# Patient Record
Sex: Male | Born: 1960 | Race: Black or African American | Hispanic: No | Smoking: Never smoker
Health system: Southern US, Community
[De-identification: ages and names within clinical notes are randomized; demographics above are authoritative.]

## PROBLEM LIST (undated history)

## (undated) DIAGNOSIS — I639 Cerebral infarction, unspecified: Secondary | ICD-10-CM

## (undated) DIAGNOSIS — G8220 Paraplegia, unspecified: Secondary | ICD-10-CM

## (undated) DIAGNOSIS — R569 Unspecified convulsions: Secondary | ICD-10-CM

## (undated) DIAGNOSIS — K219 Gastro-esophageal reflux disease without esophagitis: Secondary | ICD-10-CM

## (undated) DIAGNOSIS — R7303 Prediabetes: Secondary | ICD-10-CM

## (undated) DIAGNOSIS — N186 End stage renal disease: Secondary | ICD-10-CM

## (undated) DIAGNOSIS — I10 Essential (primary) hypertension: Secondary | ICD-10-CM

## (undated) DIAGNOSIS — N189 Chronic kidney disease, unspecified: Secondary | ICD-10-CM

## (undated) DIAGNOSIS — K5900 Constipation, unspecified: Secondary | ICD-10-CM

## (undated) DIAGNOSIS — E119 Type 2 diabetes mellitus without complications: Secondary | ICD-10-CM

## (undated) HISTORY — PX: CSF SHUNT: SHX92

---

## 2003-04-20 ENCOUNTER — Inpatient Hospital Stay (HOSPITAL_COMMUNITY): Admission: EM | Admit: 2003-04-20 | Discharge: 2003-04-24 | Payer: Self-pay | Admitting: Emergency Medicine

## 2004-12-07 ENCOUNTER — Emergency Department (HOSPITAL_COMMUNITY): Admission: EM | Admit: 2004-12-07 | Discharge: 2004-12-07 | Payer: Self-pay | Admitting: Emergency Medicine

## 2004-12-25 ENCOUNTER — Emergency Department: Payer: Self-pay | Admitting: Emergency Medicine

## 2013-09-14 ENCOUNTER — Other Ambulatory Visit (HOSPITAL_COMMUNITY): Payer: Self-pay | Admitting: Nephrology

## 2013-09-14 DIAGNOSIS — N289 Disorder of kidney and ureter, unspecified: Secondary | ICD-10-CM

## 2013-09-29 ENCOUNTER — Ambulatory Visit (HOSPITAL_COMMUNITY): Payer: Medicaid Other | Attending: Nephrology

## 2013-10-15 ENCOUNTER — Ambulatory Visit (HOSPITAL_COMMUNITY)
Admission: RE | Admit: 2013-10-15 | Discharge: 2013-10-15 | Disposition: A | Discharge status: Home / Self Care | Payer: Medicaid Other | Source: Ambulatory Visit | Attending: Nephrology | Admitting: Nephrology

## 2013-10-15 DIAGNOSIS — N289 Disorder of kidney and ureter, unspecified: Secondary | ICD-10-CM

## 2013-10-15 DIAGNOSIS — N19 Unspecified kidney failure: Secondary | ICD-10-CM | POA: Insufficient documentation

## 2013-10-15 DIAGNOSIS — N281 Cyst of kidney, acquired: Secondary | ICD-10-CM | POA: Insufficient documentation

## 2014-09-16 ENCOUNTER — Telehealth: Payer: Self-pay

## 2014-09-16 NOTE — Telephone Encounter (Signed)
Gastroenterology Pre-Procedure Review  Request Date: 09/16/2014 Requesting Physician: Dr. Legrand Rams  PATIENT REVIEW QUESTIONS: The patient responded to the following health history questions as indicated:    Pt's nephew, Cobey Caballeros, called to schedule pt's colonoscopy/ he gave me all of the triage info Terrell's mom, Iancarlo Neuburger has taken care of the pt for about a year She does not have POA papers, but said she has been signing everything for him She said she would be the one to bring the pt to the hospital and sign him out and drive him home  PLEASE ADVISE IF I CAN SCHEDULE   1. Diabetes Melitis: no 2. Joint replacements in the past 12 months: no 3. Major health problems in the past 3 months: no 4. Has an artificial valve or MVP: no 5. Has a defibrillator: no 6. Has been advised in past to take antibiotics in advance of a procedure like teeth cleaning: no    MEDICATIONS & ALLERGIES:    Patient reports the following regarding taking any blood thinners:   Plavix? no Aspirin? no Coumadin? no  Patient confirms/reports the following medications:  Current Outpatient Prescriptions  Medication Sig Dispense Refill  . amLODipine (NORVASC) 5 MG tablet Take 5 mg by mouth daily.    Marland Kitchen atorvastatin (LIPITOR) 10 MG tablet Take 10 mg by mouth daily.    . enalapril (VASOTEC) 10 MG tablet Take 10 mg by mouth daily.    Marland Kitchen FLUoxetine (PROZAC) 10 MG capsule Take 10 mg by mouth daily.    . furosemide (LASIX) 40 MG tablet Take 40 mg by mouth.    . hydrALAZINE (APRESOLINE) 25 MG tablet Take 25 mg by mouth 3 (three) times daily.    . metoprolol (LOPRESSOR) 50 MG tablet Take 50 mg by mouth 2 (two) times daily.    . NON FORMULARY Vitamin D3  1000 IU   One daily    . NON FORMULARY travatan eye gtts as directed    . omeprazole (PRILOSEC) 20 MG capsule Take 20 mg by mouth daily.    . polyethylene glycol (MIRALAX / GLYCOLAX) packet Take 17 g by mouth daily. Pt takes once weekly     No current  facility-administered medications for this visit.    Patient confirms/reports the following allergies:  No Known Allergies  No orders of the defined types were placed in this encounter.    AUTHORIZATION INFORMATION Primary Insurance:   ID #:  Group #:  Pre-Cert / Auth required: Pre-Cert / Auth #:   Secondary Insurance:   ID #:   Group #:  Pre-Cert / Auth required:  Pre-Cert / Auth #:   SCHEDULE INFORMATION: Procedure has been scheduled as follows:  Date:                      Time:  Location:   This Gastroenterology Pre-Precedure Review Form is being routed to the following provider(s): R. Garfield Cornea, MD

## 2014-09-19 NOTE — Telephone Encounter (Signed)
Ok to schedule.

## 2014-09-21 ENCOUNTER — Other Ambulatory Visit: Payer: Self-pay

## 2014-09-21 DIAGNOSIS — Z1211 Encounter for screening for malignant neoplasm of colon: Secondary | ICD-10-CM

## 2014-09-21 NOTE — Telephone Encounter (Signed)
Spoke to Cantrall. He is aware that we are cancelling the OV appt on 09/30/2014 since pt was triaged for TCS. LM for Marcell Hocevar to call me to schedule the colonoscopy.

## 2014-09-22 MED ORDER — PEG-KCL-NACL-NASULF-NA ASC-C 100 G PO SOLR: 1.0000 | ORAL | Status: DC

## 2014-09-22 NOTE — Telephone Encounter (Signed)
LMOM again for Bryce Miller to call.

## 2014-09-22 NOTE — Telephone Encounter (Signed)
PT is scheduled for 09/30/2014 at 2:30 PM with Dr. Gala Romney. Sending [facility] to [facility] and faxing the instructions to [facility]. Hoyle Sauer is aware instructions will be faxed to [facility].

## 2014-09-22 NOTE — Telephone Encounter (Signed)
LMOM for Hoyle Sauer to call in reference to her signing for the pt's procedure. Need to find out how many siblings, etc.

## 2014-09-23 NOTE — Telephone Encounter (Signed)
LMOM for Bryce Miller that it is very important for her to call today in reference to this pt's colonoscopy.

## 2014-09-24 NOTE — Telephone Encounter (Signed)
I spoke to Ruma. She and her son Enid Derry have been taking care of the pt since 06/2014. The other siblings are not involved in his care.  I called Melanie at Endo and she said that since Hoyle Sauer is next of kin that should be fine.  I informed Hoyle Sauer and she will be with him at the hospital.

## 2014-09-30 ENCOUNTER — Ambulatory Visit (HOSPITAL_COMMUNITY)
Admission: RE | Admit: 2014-09-30 | Discharge: 2014-09-30 | Disposition: A | Discharge status: Home / Self Care | Payer: Medicaid Other | Source: Ambulatory Visit | Attending: Internal Medicine | Admitting: Internal Medicine

## 2014-09-30 ENCOUNTER — Ambulatory Visit: Payer: Medicaid Other | Admitting: Nurse Practitioner

## 2014-09-30 ENCOUNTER — Encounter (HOSPITAL_COMMUNITY)
Admission: RE | Disposition: A | Discharge status: Home / Self Care | Payer: Self-pay | Source: Ambulatory Visit | Attending: Internal Medicine

## 2014-09-30 ENCOUNTER — Encounter (HOSPITAL_COMMUNITY): Payer: Self-pay | Admitting: *Deleted

## 2014-09-30 DIAGNOSIS — Z1211 Encounter for screening for malignant neoplasm of colon: Secondary | ICD-10-CM | POA: Diagnosis not present

## 2014-09-30 DIAGNOSIS — Z800 Family history of malignant neoplasm of digestive organs: Secondary | ICD-10-CM | POA: Insufficient documentation

## 2014-09-30 DIAGNOSIS — I10 Essential (primary) hypertension: Secondary | ICD-10-CM | POA: Diagnosis not present

## 2014-09-30 DIAGNOSIS — Z8673 Personal history of transient ischemic attack (TIA), and cerebral infarction without residual deficits: Secondary | ICD-10-CM | POA: Diagnosis not present

## 2014-09-30 DIAGNOSIS — Z79899 Other long term (current) drug therapy: Secondary | ICD-10-CM | POA: Diagnosis not present

## 2014-09-30 HISTORY — DX: Essential (primary) hypertension: I10

## 2014-09-30 HISTORY — DX: Cerebral infarction, unspecified: I63.9

## 2014-09-30 HISTORY — PX: COLONOSCOPY: SHX5424

## 2014-09-30 SURGERY — COLONOSCOPY
Anesthesia: Moderate Sedation

## 2014-09-30 MED ORDER — SIMETHICONE 40 MG/0.6ML PO SUSP
ORAL | Status: DC | PRN
  Administered 2014-09-30: 14:00:00

## 2014-09-30 MED ORDER — MIDAZOLAM HCL 5 MG/5ML IJ SOLN
INTRAMUSCULAR | Status: AC
  Filled 2014-09-30: qty 10

## 2014-09-30 MED ORDER — SODIUM CHLORIDE 0.9 % IV SOLN
INTRAVENOUS | Status: DC
  Administered 2014-09-30: 13:00:00 via INTRAVENOUS

## 2014-09-30 MED ORDER — ONDANSETRON HCL 4 MG/2ML IJ SOLN
INTRAMUSCULAR | Status: AC
  Filled 2014-09-30: qty 2

## 2014-09-30 MED ORDER — MIDAZOLAM HCL 5 MG/5ML IJ SOLN
INTRAMUSCULAR | Status: DC | PRN
Start: 2014-09-30 — End: 2014-09-30
  Administered 2014-09-30: 1 mg via INTRAVENOUS
  Administered 2014-09-30: 2 mg via INTRAVENOUS
  Administered 2014-09-30: 1 mg via INTRAVENOUS

## 2014-09-30 MED ORDER — ONDANSETRON HCL 4 MG/2ML IJ SOLN
INTRAMUSCULAR | Status: DC | PRN
Start: 2014-09-30 — End: 2014-09-30
  Administered 2014-09-30: 4 mg via INTRAVENOUS

## 2014-09-30 MED ORDER — MEPERIDINE HCL 100 MG/ML IJ SOLN
INTRAMUSCULAR | Status: DC | PRN
  Administered 2014-09-30 (×3): 25 mg via INTRAVENOUS

## 2014-09-30 MED ORDER — MEPERIDINE HCL 100 MG/ML IJ SOLN
INTRAMUSCULAR | Status: AC
  Filled 2014-09-30: qty 2

## 2014-09-30 NOTE — H&P (Signed)
_0 @   Primary Care Physician:  Rosita Fire, MD Primary Gastroenterologist:  Dr. Gala Romney  Pre-Procedure History & Physical: HPI:  Bryce Miller is a 54 y.o. male is here for a screening colonoscopy. No prior colonoscopy. No bowel complaints. CVA last year. Discussed at length with the nephew and sister, caregivers. No family history of colon cancer.  Past Medical History  Diagnosis Date  . Stroke   . Hypertension     No past surgical history on file.  Prior to Admission medications   Medication Sig Start Date End Date Taking? Authorizing Provider  amLODipine (NORVASC) 5 MG tablet Take 5 mg by mouth daily.   Yes Historical Provider, MD  atorvastatin (LIPITOR) 10 MG tablet Take 10 mg by mouth daily.   Yes Historical Provider, MD  cholecalciferol (VITAMIN D) 1000 UNITS tablet Take 1,000 Units by mouth daily.   Yes Historical Provider, MD  enalapril (VASOTEC) 10 MG tablet Take 10 mg by mouth daily.   Yes Historical Provider, MD  FLUoxetine (PROZAC) 10 MG capsule Take 10 mg by mouth daily.   Yes Historical Provider, MD  furosemide (LASIX) 40 MG tablet Take 40 mg by mouth.   Yes Historical Provider, MD  hydrALAZINE (APRESOLINE) 25 MG tablet Take 25 mg by mouth 3 (three) times daily.   Yes Historical Provider, MD  metoprolol (LOPRESSOR) 50 MG tablet Take 50 mg by mouth 2 (two) times daily.   Yes Historical Provider, MD  omeprazole (PRILOSEC) 20 MG capsule Take 20 mg by mouth daily.   Yes Historical Provider, MD  peg 3350 powder (MOVIPREP) 100 G SOLR Take 1 kit (200 g total) by mouth as directed. 09/22/14  Yes Daneil Dolin, MD  polyethylene glycol [facility] / Floria Raveling) packet Take 17 g by mouth daily. Pt takes once weekly   Yes Historical Provider, MD  travoprost, benzalkonium, (TRAVATAN) 0.004 % ophthalmic solution Place 1 drop into both eyes 3 (three) times daily.   Yes Historical Provider, MD    Allergies as of 09/21/2014  . (No Known Allergies)    No family history on  file.  History   Social History  . Marital Status: Divorced    Spouse Name: N/A  . Number of Children: N/A  . Years of Education: N/A   Occupational History  . Not on file.   Social History Main Topics  . Smoking status: Never Smoker   . Smokeless tobacco: Never Used  . Alcohol Use: No  . Drug Use: Not on file  . Sexual Activity: Not Currently   Other Topics Concern  . Not on file   Social History Narrative  . No narrative on file    Review of Systems: See HPI, otherwise negative ROS  Physical Exam: BP 168/113 mmHg  Pulse 65  Temp(Src) 98.2 F (36.8 C) (Oral)  Resp 14  Ht _1  (1.778 m)  Wt 230 lb (104.327 kg)  BMI 33.00 kg/m2  SpO2 96% General:   Alert,  Very little verbalization. pleasant and cooperative in NAD Head:  Normocephalic and atraumatic. Eyes:  Sclera clear, no icterus.   Conjunctiva pink. Ears:  Normal auditory acuity. Nose:  No deformity, discharge,  or lesions. Mouth:  No deformity or lesions, dentition normal. Neck:  Supple; no masses or thyromegaly. Lungs:  Clear throughout to auscultation.   No wheezes, crackles, or rhonchi. No acute distress. Heart:  Regular rate and rhythm; no murmurs, clicks, rubs,  or gallops. Abdomen:  Soft, nontender and nondistended. No masses, hepatosplenomegaly or hernias  noted. Normal bowel sounds, without guarding, and without rebound.   Msk:  Symmetrical without gross deformities. Normal posture. Pulses:  Normal pulses noted. Extremities:  Without clubbing or edema.  Impression/Plan:   Bryce Miller is now here to undergo a screening colonoscopy.  First ever screening colonoscopy-average risk. Risks, benefits, limitations, imponderables and alternatives regarding colonoscopy have been reviewed with the patient. Questions have been answered. All parties agreeable.     Notice:  This dictation was prepared with Dragon dictation along with smaller phrase technology. Any transcriptional errors that result from  this process are unintentional and may not be corrected upon review.

## 2014-09-30 NOTE — Op Note (Signed)
[facility] [address] [place], [address]   COLONOSCOPY PROCEDURE REPORT  PATIENT: Bryce Miller, Bryce Miller  MR#: [id number] BIRTHDATE: Sep 20, 1960 , 54  yrs. old GENDER: male ENDOSCOPIST: R.  Garfield Cornea, MD FACP [facility] REFERRED [id number] Legrand Rams, M.D. PROCEDURE DATE:  October 14, 2014 PROCEDURE:   Colonoscopy, screening INDICATIONS:First-ever average risk colorectal cancer screening examination. MEDICATIONS: Versed 4 mg IV and Demerol 75 mg IV in divided doses. Zofran 4 mg IV. ASA CLASS:       Class III  CONSENT: The risks, benefits, alternatives and imponderables including but not limited to bleeding, perforation as well as the possibility of a missed lesion have been reviewed.  The potential for biopsy, lesion removal, etc. have also been discussed. Questions have been answered.  All parties agreeable.  Please see the history and physical in the medical record for more information.  DESCRIPTION OF PROCEDURE:   After the risks benefits and alternatives of the procedure were thoroughly explained, informed consent was obtained.  The digital rectal exam revealed no abnormalities of the rectum.   The EC-[id number]  endoscope was introduced through the anus and advanced to the   . No adverse events experienced.   The quality of the prep was adequate  The instrument was then slowly withdrawn as the colon was fully examined.      COLON FINDINGS: Normal-appearing rectal mucosa.  Diffusely dilated redundant colon.  However, the colonic mucosa appeared entirely normal.  Retroflexion was performed. .  Withdrawal time=13 minutes 0 seconds.  The scope was withdrawn and the procedure completed. COMPLICATIONS: There were no immediate complications.  ENDOSCOPIC IMPRESSION: Normal colonoscopy (redundant colon)  RECOMMENDATIONS: Repeat colonoscopy in 10 years for screening purposes. Blood pressure noted to be up today. I recommend he resume his blood pressure medication  today.  eSigned:  R. Garfield Cornea, MD Rosalita Chessman [facility] 10/14/2014 2:55 PM   cc:  CPT CODES: ICD CODES:  The ICD and CPT codes recommended by this software are interpretations from the data that the clinical staff has captured with the software.  The verification of the translation of this report to the ICD and CPT codes and modifiers is the sole responsibility of the health care institution and practicing physician where this report was generated.  [place]. will not be held responsible for the validity of the ICD and CPT codes included on this report.  AMA assumes no liability for data contained or not contained herein. CPT is a Designer, television/film set of the [employer].

## 2014-09-30 NOTE — Discharge Instructions (Addendum)
°  Colonoscopy Discharge Instructions  Read the instructions outlined below and refer to this sheet in the next few weeks. These discharge instructions provide you with general information on caring for yourself after you leave the hospital. Your doctor may also give you specific instructions. While your treatment has been planned according to the most current medical practices available, unavoidable complications occasionally occur. If you have any problems or questions after discharge, call Dr. Gala Romney at [phone]. ACTIVITY  You may resume your regular activity, but move at a slower pace for the next 24 hours.   Take frequent rest periods for the next 24 hours.   Walking will help get rid of the air and reduce the bloated feeling in your belly (abdomen).   No driving for 24 hours (because of the medicine (anesthesia) used during the test).    Do not sign any important legal documents or operate any machinery for 24 hours (because of the anesthesia used during the test).  NUTRITION  Drink plenty of fluids.   You may resume your normal diet as instructed by your doctor.   Begin with a light meal and progress to your normal diet. Heavy or fried foods are harder to digest and may make you feel sick to your stomach (nauseated).   Avoid alcoholic beverages for 24 hours or as instructed.  MEDICATIONS  You may resume your normal medications unless your doctor tells you otherwise.  WHAT YOU CAN EXPECT TODAY  Some feelings of bloating in the abdomen.   Passage of more gas than usual.   Spotting of blood in your stool or on the toilet paper.  IF YOU HAD POLYPS REMOVED DURING THE COLONOSCOPY:  No aspirin products for 7 days or as instructed.   No alcohol for 7 days or as instructed.   Eat a soft diet for the next 24 hours.  FINDING OUT THE RESULTS OF YOUR TEST Not all test results are available during your visit. If your test results are not back during the visit, make an appointment  with your caregiver to find out the results. Do not assume everything is normal if you have not heard from your caregiver or the medical facility. It is important for you to follow up on all of your test results.  SEEK IMMEDIATE MEDICAL ATTENTION IF:  You have more than a spotting of blood in your stool.   Your belly is swollen (abdominal distention).   You are nauseated or vomiting.   You have a temperature over 101.   You have abdominal pain or discomfort that is severe or gets worse throughout the day.   Repeat colonoscopy in 10 years for screening purposes  Your blood pressure was noted to be elevated today; resume your blood pressure medications when you get home

## 2014-10-01 ENCOUNTER — Encounter (HOSPITAL_COMMUNITY): Payer: Self-pay | Admitting: Internal Medicine

## 2015-10-21 ENCOUNTER — Other Ambulatory Visit: Payer: Self-pay | Admitting: *Deleted

## 2015-10-21 DIAGNOSIS — Z01810 Encounter for preprocedural cardiovascular examination: Secondary | ICD-10-CM

## 2015-10-21 DIAGNOSIS — N184 Chronic kidney disease, stage 4 (severe): Secondary | ICD-10-CM

## 2015-11-16 ENCOUNTER — Encounter: Payer: Self-pay | Admitting: Vascular Surgery

## 2015-11-24 ENCOUNTER — Other Ambulatory Visit: Payer: Self-pay

## 2015-11-24 ENCOUNTER — Ambulatory Visit (HOSPITAL_COMMUNITY)
Admission: RE | Admit: 2015-11-24 | Discharge: 2015-11-24 | Disposition: A | Discharge status: Home / Self Care | Payer: Medicaid Other | Source: Ambulatory Visit | Attending: Vascular Surgery | Admitting: Vascular Surgery

## 2015-11-24 ENCOUNTER — Ambulatory Visit (INDEPENDENT_AMBULATORY_CARE_PROVIDER_SITE_OTHER)
Admission: RE | Admit: 2015-11-24 | Discharge: 2015-11-24 | Disposition: A | Discharge status: Home / Self Care | Payer: Medicaid Other | Source: Ambulatory Visit | Attending: Vascular Surgery | Admitting: Vascular Surgery

## 2015-11-24 ENCOUNTER — Encounter: Payer: Self-pay | Admitting: Vascular Surgery

## 2015-11-24 ENCOUNTER — Ambulatory Visit (INDEPENDENT_AMBULATORY_CARE_PROVIDER_SITE_OTHER): Payer: Medicaid Other | Admitting: Vascular Surgery

## 2015-11-24 VITALS — BP 151/100 | HR 78 | Temp 98.3°F | Resp 14 | Ht 72.0 in | Wt 225.0 lb

## 2015-11-24 DIAGNOSIS — N184 Chronic kidney disease, stage 4 (severe): Secondary | ICD-10-CM

## 2015-11-24 DIAGNOSIS — I82712 Chronic embolism and thrombosis of superficial veins of left upper extremity: Secondary | ICD-10-CM | POA: Diagnosis not present

## 2015-11-24 DIAGNOSIS — I129 Hypertensive chronic kidney disease with stage 1 through stage 4 chronic kidney disease, or unspecified chronic kidney disease: Secondary | ICD-10-CM | POA: Diagnosis not present

## 2015-11-24 DIAGNOSIS — Z01810 Encounter for preprocedural cardiovascular examination: Secondary | ICD-10-CM | POA: Insufficient documentation

## 2015-11-24 NOTE — Progress Notes (Signed)
Referring Physician: Dr Theador Hawthorne Patient name: Bryce Miller MRN: [id number] DOB: 09/12/1960 Sex: male  REASON FOR CONSULT: Hemodialysis access  HPI: Bryce Miller is a 55 y.o. male referred for placement of long-term hemodialysis access. The patient is paraplegic. 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 He is not really talk much. His family was here with him for the office visit today. He is currently not on hemodialysis. He is right handed. He has not had any prior access procedures. Other medical problems include hypertension which is currently controlled.  Past Medical History  Diagnosis Date  . Stroke ([place])   . Hypertension    Past Surgical History  Procedure Laterality Date  . Colonoscopy N/A 09/30/2014    Procedure: COLONOSCOPY;  Surgeon: Daneil Dolin, MD;  Location: [facility];  Service: Endoscopy;  Laterality: N/A;  2:30 AM - moved to 1:10 - Ginger to notify pt  . Csf shunt      Family History  Problem Relation Age of Onset  . Diabetes Mother     SOCIAL HISTORY: Social History   Social History  . Marital Status: Divorced    Spouse Name: N/A  . Number of Children: N/A  . Years of Education: N/A   Occupational History  . Not on file.   Social History Main Topics  . Smoking status: Never Smoker   . Smokeless tobacco: Never Used  . Alcohol Use: No  . Drug Use: Not on file  . Sexual Activity: Not Currently   Other Topics Concern  . Not on file   Social History Narrative    No Known Allergies  Current Outpatient Prescriptions  Medication Sig Dispense Refill  . amLODipine (NORVASC) 5 MG tablet Take 5 mg by mouth daily.    Marland Kitchen atorvastatin (LIPITOR) 10 MG tablet Take 10 mg by mouth daily.    . cholecalciferol (VITAMIN D) 1000 UNITS tablet Take 1,000 Units by mouth daily.    . enalapril (VASOTEC) 10 MG tablet Take 10 mg by mouth daily.    Marland Kitchen FLUoxetine (PROZAC) 10 MG capsule Take 10 mg by mouth  daily.    . furosemide (LASIX) 40 MG tablet Take 40 mg by mouth daily.     . hydrALAZINE (APRESOLINE) 25 MG tablet Take 25 mg by mouth 3 (three) times daily.    . metoprolol (LOPRESSOR) 50 MG tablet Take 50 mg by mouth 2 (two) times daily.    Marland Kitchen omeprazole (PRILOSEC) 20 MG capsule Take 20 mg by mouth daily.    . polyethylene glycol (MIRALAX / GLYCOLAX) packet Take 17 g by mouth every Thursday.     . travoprost, benzalkonium, (TRAVATAN) 0.004 % ophthalmic solution Place 1 drop into both eyes 3 (three) times daily.     No current facility-administered medications for this visit.    ROS:   Unable to obtain patient does not speak  Physical Examination  Filed Vitals:   11/24/15 0930 11/24/15 0935  BP: 156/105 151/100  Pulse: 66 78  Temp: 98.3 F (36.8 C)   TempSrc: Oral   Resp: 14   Height: 6' (1.829 m)   Weight: 225 lb (102.059 kg)   SpO2: 98%     Body mass index is 30.51 kg/(m^2).  General:  Alert and oriented, no acute distress HEENT: Normal Neck: No bruit or JVD Pulmonary: Clear to auscultation bilaterally Cardiac: Regular Rate and Rhythm without murmur Abdomen: Soft, non-tender, non-distended, no mass Skin: No  rash Extremity Pulses:  2+ radial, brachial pulses bilaterally Musculoskeletal: No deformity or edema  Neurologic: Right upper extremity 5 over 5 motor strength, left upper extremity spastic involuntary movements which he is unable to control  DATA:  Patient had a vein mapping ultrasound today which shows that the right cephalic vein is 3 mm throughout its course the basilic vein is 3-4 mm on the left side of the cephalic vein is 3 mm in diameter in the basilic vein is 3 mm in diameter there was some thrombus at the left antecubital area. Patient had normal arterial anatomic configuration bilaterally. I reviewed and interpreted both of these studies.  ASSESSMENT:  Patient needs long-term hemodialysis access. Although he is right hand dominant, because of the  involuntary movements of his left upper extremity that do not believe he would be able to hold this arm still during hemodialysis. Therefore we'll place a right upper arm brachiocephalic fistula. Risks benefits possible complications were discussed with the patient and his family today. He understands and wishes to proceed. Attention complications including non-maturation of the fistula 10-15%, bleeding infection ischemic steal. They understand and wish to proceed   PLAN:  Right arm AV fistula in the next few weeks.   Ruta Hinds, MD [facility] Office: [phone] Pager: [phone]

## 2015-11-25 NOTE — Progress Notes (Signed)
Pre-op instructions according to checklist left on pt voice mailbox as requested.

## 2015-11-28 ENCOUNTER — Ambulatory Visit (HOSPITAL_COMMUNITY): Admission: RE | Admit: 2015-11-28 | Payer: Medicaid Other | Source: Ambulatory Visit | Admitting: Vascular Surgery

## 2015-11-28 ENCOUNTER — Encounter (HOSPITAL_COMMUNITY): Admission: RE | Payer: Self-pay | Source: Ambulatory Visit

## 2015-11-28 ENCOUNTER — Encounter: Payer: Self-pay | Admitting: Nephrology

## 2015-11-28 SURGERY — ARTERIOVENOUS (AV) FISTULA CREATION
Anesthesia: Monitor Anesthesia Care | Laterality: Right

## 2015-12-01 ENCOUNTER — Other Ambulatory Visit: Payer: Self-pay

## 2015-12-05 ENCOUNTER — Encounter (HOSPITAL_COMMUNITY): Payer: Self-pay | Admitting: *Deleted

## 2015-12-05 NOTE — Progress Notes (Signed)
Spoke with pt's sister, Hoyle Sauer after getting permission from pt to do so. She verified meds, allergies, medical and surgical history. She denies that pt has any cardiac history, she states he has not c/o any recent chest pain or sob. She states pt is a "borderline" diabetic. States they check his blood sugar at home and his fasting blood sugar is usually around 107-114.

## 2015-12-06 ENCOUNTER — Telehealth: Payer: Self-pay | Admitting: Vascular Surgery

## 2015-12-06 ENCOUNTER — Ambulatory Visit (HOSPITAL_COMMUNITY): Payer: Medicaid Other | Admitting: Certified Registered Nurse Anesthetist

## 2015-12-06 ENCOUNTER — Ambulatory Visit (HOSPITAL_COMMUNITY)
Admission: RE | Admit: 2015-12-06 | Discharge: 2015-12-06 | Disposition: A | Discharge status: Home / Self Care | Payer: Medicaid Other | Source: Ambulatory Visit | Attending: Vascular Surgery | Admitting: Vascular Surgery

## 2015-12-06 ENCOUNTER — Encounter (HOSPITAL_COMMUNITY): Payer: Self-pay | Admitting: Certified Registered Nurse Anesthetist

## 2015-12-06 ENCOUNTER — Encounter (HOSPITAL_COMMUNITY)
Admission: RE | Disposition: A | Discharge status: Home / Self Care | Payer: Self-pay | Source: Ambulatory Visit | Attending: Vascular Surgery

## 2015-12-06 DIAGNOSIS — E1122 Type 2 diabetes mellitus with diabetic chronic kidney disease: Secondary | ICD-10-CM | POA: Diagnosis not present

## 2015-12-06 DIAGNOSIS — Z8673 Personal history of transient ischemic attack (TIA), and cerebral infarction without residual deficits: Secondary | ICD-10-CM | POA: Diagnosis not present

## 2015-12-06 DIAGNOSIS — N185 Chronic kidney disease, stage 5: Secondary | ICD-10-CM | POA: Insufficient documentation

## 2015-12-06 DIAGNOSIS — I120 Hypertensive chronic kidney disease with stage 5 chronic kidney disease or end stage renal disease: Secondary | ICD-10-CM | POA: Insufficient documentation

## 2015-12-06 DIAGNOSIS — N184 Chronic kidney disease, stage 4 (severe): Secondary | ICD-10-CM | POA: Diagnosis not present

## 2015-12-06 HISTORY — DX: Gastro-esophageal reflux disease without esophagitis: K21.9

## 2015-12-06 HISTORY — DX: Constipation, unspecified: K59.00

## 2015-12-06 HISTORY — DX: Chronic kidney disease, unspecified: N18.9

## 2015-12-06 HISTORY — DX: Paraplegia, unspecified: G82.20

## 2015-12-06 HISTORY — DX: Type 2 diabetes mellitus without complications: E11.9

## 2015-12-06 HISTORY — PX: AV FISTULA PLACEMENT: SHX1204

## 2015-12-06 LAB — GLUCOSE, CAPILLARY: GLUCOSE-CAPILLARY: 75 mg/dL (ref 65–99)

## 2015-12-06 LAB — POCT I-STAT 4, (NA,K, GLUC, HGB,HCT)
GLUCOSE: 92 mg/dL (ref 65–99)
HEMATOCRIT: 33 % — AB (ref 39.0–52.0)
HEMOGLOBIN: 11.2 g/dL — AB (ref 13.0–17.0)
Potassium: 3.8 mmol/L (ref 3.5–5.1)
Sodium: 143 mmol/L (ref 135–145)

## 2015-12-06 SURGERY — ARTERIOVENOUS (AV) FISTULA CREATION
Anesthesia: Monitor Anesthesia Care | Site: Arm Lower | Laterality: Right

## 2015-12-06 MED ORDER — PHENYLEPHRINE HCL 10 MG/ML IJ SOLN
INTRAMUSCULAR | Status: DC | PRN
  Administered 2015-12-06 (×2): 80 ug via INTRAVENOUS

## 2015-12-06 MED ORDER — FENTANYL CITRATE (PF) 250 MCG/5ML IJ SOLN
INTRAMUSCULAR | Status: AC
  Filled 2015-12-06: qty 5

## 2015-12-06 MED ORDER — PROPOFOL 500 MG/50ML IV EMUL
INTRAVENOUS | Status: DC | PRN
  Administered 2015-12-06: 50 ug/kg/min via INTRAVENOUS

## 2015-12-06 MED ORDER — CHLORHEXIDINE GLUCONATE CLOTH 2 % EX PADS: 6.0000 | MEDICATED_PAD | Freq: Once | CUTANEOUS | Status: DC

## 2015-12-06 MED ORDER — OXYCODONE-ACETAMINOPHEN 5-325 MG PO TABS: 1.0000 | ORAL_TABLET | Freq: Four times a day (QID) | ORAL | Status: DC | PRN

## 2015-12-06 MED ORDER — LIDOCAINE HCL (PF) 1 % IJ SOLN
INTRAMUSCULAR | Status: AC
  Filled 2015-12-06: qty 30

## 2015-12-06 MED ORDER — 0.9 % SODIUM CHLORIDE (POUR BTL) OPTIME
TOPICAL | Status: DC | PRN
  Administered 2015-12-06: 1000 mL

## 2015-12-06 MED ORDER — MIDAZOLAM HCL 2 MG/2ML IJ SOLN
INTRAMUSCULAR | Status: DC | PRN
  Administered 2015-12-06: 2 mg via INTRAVENOUS

## 2015-12-06 MED ORDER — LIDOCAINE HCL (CARDIAC) 20 MG/ML IV SOLN
INTRAVENOUS | Status: DC | PRN
  Administered 2015-12-06: 100 mg via INTRAVENOUS

## 2015-12-06 MED ORDER — PROPOFOL 10 MG/ML IV BOLUS
INTRAVENOUS | Status: DC | PRN
  Administered 2015-12-06: 20 mg via INTRAVENOUS

## 2015-12-06 MED ORDER — FENTANYL CITRATE (PF) 250 MCG/5ML IJ SOLN
INTRAMUSCULAR | Status: DC | PRN
  Administered 2015-12-06: 50 ug via INTRAVENOUS

## 2015-12-06 MED ORDER — MIDAZOLAM HCL 2 MG/2ML IJ SOLN
INTRAMUSCULAR | Status: AC
  Filled 2015-12-06: qty 2

## 2015-12-06 MED ORDER — DEXTROSE 5 % IV SOLN
1.5000 g | INTRAVENOUS | Status: AC
  Administered 2015-12-06: 1.5 g via INTRAVENOUS

## 2015-12-06 MED ORDER — SODIUM CHLORIDE 0.9 % IV SOLN
INTRAVENOUS | Status: DC | PRN
  Administered 2015-12-06: 09:00:00

## 2015-12-06 MED ORDER — DEXTROSE 5 % IV SOLN
INTRAVENOUS | Status: AC
  Filled 2015-12-06: qty 1.5

## 2015-12-06 MED ORDER — LIDOCAINE HCL (PF) 1 % IJ SOLN
INTRAMUSCULAR | Status: DC | PRN
  Administered 2015-12-06: 5.5 mL

## 2015-12-06 MED ORDER — SODIUM CHLORIDE 0.9 % IV SOLN
INTRAVENOUS | Status: DC
  Administered 2015-12-06 (×2): via INTRAVENOUS

## 2015-12-06 SURGICAL SUPPLY — 36 items
ADH SKN CLS APL DERMABOND .7 (GAUZE/BANDAGES/DRESSINGS) ×1
ARMBAND PINK RESTRICT EXTREMIT (MISCELLANEOUS) ×2 IMPLANT
CANISTER SUCTION 2500CC (MISCELLANEOUS) ×2 IMPLANT
CANNULA VESSEL 3MM 2 BLNT TIP (CANNULA) ×2 IMPLANT
CLIP TI MEDIUM 6 (CLIP) ×2 IMPLANT
CLIP TI WIDE RED SMALL 6 (CLIP) ×2 IMPLANT
COVER PROBE W GEL 5X96 (DRAPES) IMPLANT
DECANTER SPIKE VIAL GLASS SM (MISCELLANEOUS) ×2 IMPLANT
DERMABOND ADVANCED (GAUZE/BANDAGES/DRESSINGS) ×1
DERMABOND ADVANCED .7 DNX12 (GAUZE/BANDAGES/DRESSINGS) IMPLANT
DRAIN PENROSE 1/4X12 LTX STRL (WOUND CARE) ×2 IMPLANT
ELECT REM PT RETURN 9FT ADLT (ELECTROSURGICAL) ×2
ELECTRODE REM PT RTRN 9FT ADLT (ELECTROSURGICAL) ×1 IMPLANT
GLOVE BIO SURGEON STRL SZ7.5 (GLOVE) ×2 IMPLANT
GLOVE BIOGEL PI IND STRL 6.5 (GLOVE) IMPLANT
GLOVE BIOGEL PI IND STRL 7.0 (GLOVE) IMPLANT
GLOVE BIOGEL PI INDICATOR 6.5 (GLOVE) ×6
GLOVE BIOGEL PI INDICATOR 7.0 (GLOVE) ×1
GLOVE SS BIOGEL STRL SZ 7 (GLOVE) IMPLANT
GLOVE SUPERSENSE BIOGEL SZ 7 (GLOVE) ×1
GOWN STRL REUS W/ TWL LRG LVL3 (GOWN DISPOSABLE) ×3 IMPLANT
GOWN STRL REUS W/TWL LRG LVL3 (GOWN DISPOSABLE) ×6
KIT BASIN OR (CUSTOM PROCEDURE TRAY) ×2 IMPLANT
KIT ROOM TURNOVER OR (KITS) ×2 IMPLANT
LIQUID BAND (GAUZE/BANDAGES/DRESSINGS) ×2 IMPLANT
LOOP VESSEL MINI RED (MISCELLANEOUS) IMPLANT
NS IRRIG 1000ML POUR BTL (IV SOLUTION) ×2 IMPLANT
PACK CV ACCESS (CUSTOM PROCEDURE TRAY) ×2 IMPLANT
PAD ARMBOARD 7.5X6 YLW CONV (MISCELLANEOUS) ×4 IMPLANT
SPONGE SURGIFOAM ABS GEL 100 (HEMOSTASIS) IMPLANT
SUT PROLENE 7 0 BV 1 (SUTURE) ×2 IMPLANT
SUT VIC AB 3-0 SH 27 (SUTURE) ×2
SUT VIC AB 3-0 SH 27X BRD (SUTURE) ×1 IMPLANT
SUT VICRYL 4-0 PS2 18IN ABS (SUTURE) ×2 IMPLANT
UNDERPAD 30X30 INCONTINENT (UNDERPADS AND DIAPERS) ×2 IMPLANT
WATER STERILE IRR 1000ML POUR (IV SOLUTION) ×2 IMPLANT

## 2015-12-06 NOTE — Interval H&P Note (Signed)
[facility]  History and Physical Update  The patient was interviewed and re-examined.  The patient's previous History and Physical has been reviewed and is unchanged from Dr. Nona Dell consult.  There is no change in the plan of care: R BC AVF.  I discussed with the patient and family having myself complete Mr. Yankey's procedure.  The family is ok with this change in surgeons.   Risk, benefits, and alternatives to access surgery were discussed.    The patient is aware the risks include but are not limited to: bleeding, infection, steal syndrome, nerve damage, ischemic monomelic neuropathy, failure to mature, need for additional procedures, death and stroke.    The patient agrees to proceed forward with the procedure.   Adele Barthel, MD [facility] Office: [phone] Pager: [phone]  12/06/2015, 7:38 AM

## 2015-12-06 NOTE — Anesthesia Preprocedure Evaluation (Signed)
Anesthesia Evaluation  Patient identified by MRN, date of birth, ID band Patient awake    Reviewed: Allergy & Precautions, H&P , NPO status , Patient's Chart, lab work & pertinent test results, reviewed documented beta blocker date and time   Airway Mallampati: II  TM Distance: >3 FB Neck ROM: Full    Dental no notable dental hx. (+) Teeth Intact, Dental Advisory Given   Pulmonary neg pulmonary ROS,    Pulmonary exam normal breath sounds clear to auscultation       Cardiovascular hypertension, Pt. on medications and Pt. on home beta blockers  Rhythm:Regular Rate:Normal     Neuro/Psych CVA, Residual Symptoms negative psych ROS   GI/Hepatic Neg liver ROS, GERD  Medicated and Controlled,  Endo/Other  diabetes  Renal/GU Renal InsufficiencyRenal disease  negative genitourinary   Musculoskeletal   Abdominal   Peds  Hematology negative hematology ROS (+)   Anesthesia Other Findings   Reproductive/Obstetrics negative OB ROS                             Anesthesia Physical Anesthesia Plan  ASA: III  Anesthesia Plan: MAC   Post-op Pain Management:    Induction: Intravenous  Airway Management Planned: Simple Face Mask  Additional Equipment:   Intra-op Plan:   Post-operative Plan:   Informed Consent: I have reviewed the patients History and Physical, chart, labs and discussed the procedure including the risks, benefits and alternatives for the proposed anesthesia with the patient or authorized representative who has indicated his/her understanding and acceptance.   Dental advisory given  Plan Discussed with: CRNA  Anesthesia Plan Comments:         Anesthesia Quick Evaluation

## 2015-12-06 NOTE — Progress Notes (Signed)
CBG on arrival to PACU was 75. Patient able to eat and drink adequately. Dr. Therisa Doyne aware.

## 2015-12-06 NOTE — Discharge Instructions (Signed)
° ° °  12/06/2015 Farron Yeargin [id number] 1961-08-06  Surgeon(s): Conrad LaSalle, MD  Procedure(s): ARTERIOVENOUS (AV) FISTULA CREATION-RIGHT upper arm   x Do not stick fistula for 12 weeks

## 2015-12-06 NOTE — Telephone Encounter (Signed)
-----   Message from Mena Goes, RN sent at 12/06/2015 10:29 AM EDT ----- Regarding: schedule   ----- Message -----    From: Gabriel Earing, PA-C    Sent: 12/06/2015   9:42 AM      To: [facility] Charge Pool  S/p right BC AVF 12/06/15.  F/u with Dr. Bridgett Larsson in 6 weeks.  He does not need a duplex.  Thanks [employer]

## 2015-12-06 NOTE — Transfer of Care (Signed)
Immediate Anesthesia Transfer of Care Note  Patient: Bryce Miller  Procedure(s) Performed: Procedure(s): ARTERIOVENOUS (AV) FISTULA CREATION-RIGHT upper arm  (Right)  Patient Location: PACU  Anesthesia Type:MAC  Level of Consciousness: awake and alert   Airway & Oxygen Therapy: Patient Spontanous Breathing and Patient connected to face mask oxygen  Post-op Assessment: Report given to RN and Post -op Vital signs reviewed and stable  Post vital signs: Reviewed and stable  Last Vitals:  Filed Vitals:   12/06/15 0735 12/06/15 0955  BP: 158/110 132/95  Pulse: 66 73  Temp: 37.2 C 36.5 C  Resp: 16 13    Last Pain: There were no vitals filed for this visit.    Patients Stated Pain Goal: 2 ([id number])  Complications: No apparent anesthesia complications

## 2015-12-06 NOTE — H&P (View-Only) (Signed)
Referring Physician: Dr Theador Hawthorne Patient name: Bryce Miller MRN: [id number] DOB: 08-02-1961 Sex: male  REASON FOR CONSULT: Hemodialysis access  HPI: [name] is a 55 y.o. male referred for placement of long-term hemodialysis access. The patient is paraplegic. 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 He is not really talk much. His family was here with him for the office visit today. He is currently not on hemodialysis. He is right handed. He has not had any prior access procedures. Other medical problems include hypertension which is currently controlled.  Past Medical History  Diagnosis Date  . Stroke ([place])   . Hypertension    Past Surgical History  Procedure Laterality Date  . Colonoscopy N/A 09/30/2014    Procedure: COLONOSCOPY;  Surgeon: Daneil Dolin, MD;  [facility];  Service: Endoscopy;  Laterality: N/A;  2:30 AM - moved to 1:10 - Ginger to notify pt  . Csf shunt      Family History  Problem Relation Age of Onset  . Diabetes Mother     SOCIAL HISTORY: Social History   Social History  . Marital Status: Divorced    Spouse Name: N/A  . Number of Children: N/A  . Years of Education: N/A   Occupational History  . Not on file.   Social History Main Topics  . Smoking status: Never Smoker   . Smokeless tobacco: Never Used  . Alcohol Use: No  . Drug Use: Not on file  . Sexual Activity: Not Currently   Other Topics Concern  . Not on file   Social History Narrative    No Known Allergies  Current Outpatient Prescriptions  Medication Sig Dispense Refill  . amLODipine (NORVASC) 5 MG tablet Take 5 mg by mouth daily.    Marland Kitchen atorvastatin (LIPITOR) 10 MG tablet Take 10 mg by mouth daily.    . cholecalciferol (VITAMIN D) 1000 UNITS tablet Take 1,000 Units by mouth daily.    . enalapril (VASOTEC) 10 MG tablet Take 10 mg by mouth daily.    Marland Kitchen FLUoxetine (PROZAC) 10 MG capsule Take 10 mg by mouth  daily.    . furosemide (LASIX) 40 MG tablet Take 40 mg by mouth daily.     . hydrALAZINE (APRESOLINE) 25 MG tablet Take 25 mg by mouth 3 (three) times daily.    . metoprolol (LOPRESSOR) 50 MG tablet Take 50 mg by mouth 2 (two) times daily.    Marland Kitchen omeprazole (PRILOSEC) 20 MG capsule Take 20 mg by mouth daily.    . polyethylene glycol (MIRALAX / GLYCOLAX) packet Take 17 g by mouth every Thursday.     . travoprost, benzalkonium, (TRAVATAN) 0.004 % ophthalmic solution Place 1 drop into both eyes 3 (three) times daily.     No current facility-administered medications for this visit.    ROS:   Unable to obtain patient does not speak  Physical Examination  Filed Vitals:   11/24/15 0930 11/24/15 0935  BP: 156/105 151/100  Pulse: 66 78  Temp: 98.3 F (36.8 C)   TempSrc: Oral   Resp: 14   Height: 6' (1.829 m)   Weight: 225 lb (102.059 kg)   SpO2: 98%     Body mass index is 30.51 kg/(m^2).  General:  Alert and oriented, no acute distress HEENT: Normal Neck: No bruit or JVD Pulmonary: Clear to auscultation bilaterally Cardiac: Regular Rate and Rhythm without murmur Abdomen: Soft, non-tender, non-distended, no mass Skin: No  rash Extremity Pulses:  2+ radial, brachial pulses bilaterally Musculoskeletal: No deformity or edema  Neurologic: Right upper extremity 5 over 5 motor strength, left upper extremity spastic involuntary movements which he is unable to control  DATA:  Patient had a vein mapping ultrasound today which shows that the right cephalic vein is 3 mm throughout its course the basilic vein is 3-4 mm on the left side of the cephalic vein is 3 mm in diameter in the basilic vein is 3 mm in diameter there was some thrombus at the left antecubital area. Patient had normal arterial anatomic configuration bilaterally. I reviewed and interpreted both of these studies.  ASSESSMENT:  Patient needs long-term hemodialysis access. Although he is right hand dominant, because of the  involuntary movements of his left upper extremity that do not believe he would be able to hold this arm still during hemodialysis. Therefore we'll place a right upper arm brachiocephalic fistula. Risks benefits possible complications were discussed with the patient and his family today. He understands and wishes to proceed. Attention complications including non-maturation of the fistula 10-15%, bleeding infection ischemic steal. They understand and wish to proceed   PLAN:  Right arm AV fistula in the next few weeks.   Ruta Hinds, MD [facility] Office: [phone] Pager: [phone]

## 2015-12-06 NOTE — Op Note (Signed)
OPERATIVE NOTE   PROCEDURE: right brachiocephalic arteriovenous fistula placement  PRE-OPERATIVE DIAGNOSIS: chronic kidney disease stage IV-V   POST-OPERATIVE DIAGNOSIS: same as above   SURGEON: Adele Barthel, MD  ASSISTANT(S): Leontine Locket, PAC   ANESTHESIA: general  ESTIMATED BLOOD LOSS: 50 cc  FINDING(S): 1.  Palpable thrill with palpable radial pulse  SPECIMEN(S):  none  INDICATIONS:   Bryce Miller is a 55 y.o. male who presents with chronic kidney disease stage IV-V.  The patient is scheduled for right brachiocephalic arteriovenous fistula placement.  The patient is aware the risks include but are not limited to: bleeding, infection, steal syndrome, nerve damage, ischemic monomelic neuropathy, failure to mature, and need for additional procedures.  The patient is aware of the risks of the procedure and elects to proceed forward.  DESCRIPTION: After full informed written consent was obtained from the patient, the patient was brought back to the operating room and placed supine upon the operating table.  Prior to induction, the patient received IV antibiotics.   After obtaining adequate anesthesia, the patient was then prepped and draped in the standard fashion for a right arm access procedure.  I turned my attention first to identifying the patient's cephalic vein and brachial artery.  Using SonoSite guidance, the location of these vessels were marked out on the skin.   At this point, I injected local anesthetic to obtain a field block of the antecubitum.  In total, I injected about 10 mL of 1% lidocaine without epinephrine.  I made a transverse incision at the level of the antecubitum and dissected through the subcutaneous tissue and fascia to gain exposure of the brachial artery.  This was noted to be 4 mm in diameter externally.  This was dissected out proximally and distally and controlled with vessel loops .  I then dissected out the cephalic vein.  This was noted to be 304  mm in diameter externally.  The distal segment of the vein was ligated with a  2-0 silk, and the vein was transected.  The proximal segment was interrogated with serial dilators.  The vein accepted up to a 5 mm dilator without any difficulty.  I then instilled the heparinized saline into the vein and clamped it.  At this point, I reset my exposure of the brachial artery and placed the artery under tension proximally and distally.  I made an arteriotomy with a #11 blade, and then I extended the arteriotomy with a Potts scissor.  I injected heparinized saline proximal and distal to this arteriotomy.  The vein was then sewn to the artery in an end-to-side configuration with a running stitch of 7-0 Prolene.  Prior to completing this anastomosis, I allowed the vein and artery to backbleed.  There was no evidence of clot from any vessels.  I completed the anastomosis in the usual fashion and then released all vessel loops and clamps.  There was a palpable thrill in the venous outflow, and there was a palpable radial pulse.  At this point, I irrigated out the surgical wound.  There was no further active bleeding.  The subcutaneous tissue was reapproximated with a running stitch of 3-0 Vicryl.  The skin was then reapproximated with a running subcuticular stitch of 4-0 Vicryl.  The skin was then cleaned, dried, and reinforced with Dermabond.  The patient tolerated this procedure well.    COMPLICATIONS: none  CONDITION: stable   Adele Barthel, MD [facility] Office: [phone] Pager: [phone]  12/06/2015,  9:38 AM

## 2015-12-06 NOTE — Telephone Encounter (Signed)
sched appt 01/20/16 at 8:45. Lm on hm# to inform pt of appt.

## 2015-12-06 NOTE — Anesthesia Postprocedure Evaluation (Signed)
Anesthesia Post Note  Patient: Bryce Miller  Procedure(s) Performed: Procedure(s) (LRB): ARTERIOVENOUS (AV) FISTULA CREATION-RIGHT upper arm  (Right)  Patient location during evaluation: PACU Anesthesia Type: MAC Level of consciousness: awake and alert Pain management: pain level controlled Vital Signs Assessment: post-procedure vital signs reviewed and stable Respiratory status: spontaneous breathing, nonlabored ventilation and respiratory function stable Cardiovascular status: stable and blood pressure returned to baseline Anesthetic complications: no    Last Vitals:  Filed Vitals:   12/06/15 1015 12/06/15 1024  BP:  145/99  Pulse: 75 71  Temp:    Resp: 13 12    Last Pain: There were no vitals filed for this visit.               Kiowa Peifer,W. EDMOND

## 2015-12-08 ENCOUNTER — Encounter (HOSPITAL_COMMUNITY): Payer: Self-pay | Admitting: Vascular Surgery

## 2016-01-16 ENCOUNTER — Encounter: Payer: Self-pay | Admitting: Vascular Surgery

## 2016-01-19 DIAGNOSIS — N184 Chronic kidney disease, stage 4 (severe): Secondary | ICD-10-CM | POA: Insufficient documentation

## 2016-01-19 NOTE — Progress Notes (Signed)
    Postoperative Access Visit   History of Present Illness  Bryce Miller is a 55 y.o. year old male who presents for postoperative follow-up for: L BC AVF (Date: 12/06/15).  The patient's wounds are healed.  The patient notes no steal symptoms.  The patient is able to complete their activities of daily living.  The patient's current symptoms are: none.  For VQI Use Only  PRE-ADM LIVING: Home  AMB STATUS: Ambulatory  Physical Examination Filed Vitals:   01/20/16 0850  BP: 140/87  Pulse: 77    LUE: Incision is healed, skin feels warm, hand grip is 5/5, sensation in digits is intact, palpable thrill, bruit can be auscultated, visible fistula, On Sonosite: fistula >6 mm throughout  Medical Decision Making  Bryce Miller is a 55 y.o. year old male who presents s/p L BC AVF .  The patient's access is ready for use.  Thank you for allowing [place] to participate in this patient's care.  Adele Barthel, MD, FACS [facility] Office: [phone] Pager: [phone]

## 2016-01-20 ENCOUNTER — Ambulatory Visit (INDEPENDENT_AMBULATORY_CARE_PROVIDER_SITE_OTHER): Payer: Self-pay | Admitting: Vascular Surgery

## 2016-01-20 ENCOUNTER — Encounter: Payer: Self-pay | Admitting: Vascular Surgery

## 2016-01-20 VITALS — BP 140/87 | HR 77 | Ht 72.0 in | Wt 225.0 lb

## 2016-01-20 DIAGNOSIS — N184 Chronic kidney disease, stage 4 (severe): Secondary | ICD-10-CM

## 2017-02-11 ENCOUNTER — Encounter: Payer: Self-pay | Admitting: Family

## 2017-02-11 ENCOUNTER — Encounter: Payer: Self-pay | Admitting: Vascular Surgery

## 2017-02-11 ENCOUNTER — Ambulatory Visit (INDEPENDENT_AMBULATORY_CARE_PROVIDER_SITE_OTHER): Payer: Medicaid Other | Admitting: Family

## 2017-02-11 ENCOUNTER — Telehealth: Payer: Self-pay | Admitting: *Deleted

## 2017-02-11 VITALS — BP 118/79 | HR 63 | Temp 98.1°F | Resp 16 | Ht 72.0 in | Wt 225.0 lb

## 2017-02-11 DIAGNOSIS — N186 End stage renal disease: Secondary | ICD-10-CM

## 2017-02-11 DIAGNOSIS — Z992 Dependence on renal dialysis: Secondary | ICD-10-CM

## 2017-02-11 DIAGNOSIS — I770 Arteriovenous fistula, acquired: Secondary | ICD-10-CM | POA: Diagnosis not present

## 2017-02-11 NOTE — Telephone Encounter (Signed)
Patients mother called stating the patient is complaining that the AVF has "moved" and is swollen, that it does not "look right'.  Denies fever, drainage and pain.  Scheduled an appointment to see NP today at 12:15.

## 2017-02-11 NOTE — Progress Notes (Signed)
CC: Pt's mother Concern about left arm AV Fistula  History of Present Illness  Bryce Miller is a 56 y.o. (09-08-1960) male who is s/p  L BC AVF (Date: 12/06/15) by Dr. Bridgett Larsson. Dr. Bridgett Larsson last evaluated pt on 01-20-16. At that time the patient's wounds were healed.  The patient noted no steal symptoms.  The patient was able to complete their activities of daily living.  The patient's current symptoms are: none  Dr. Bridgett Larsson advised that the access could be used at that time.  He returns today after phone call received from pt's mother that the AVF does not look right. She states the antecubital portion of the AV fistula was not visible until the last few days and is concerned about this.   Pt has right hemiparesis from a stroke, lives with his mother. Mother states pt walks with a walker sometimes.  He has not yet started hemodialysis, but male family member with him states pt's nephrologist indicated that pt will likely start dialysis between now and Labor Day.  Pt denies steal symptoms in his left upper extremity.  He has not had previous access procedures.    Past Medical History:  Diagnosis Date  . Chronic kidney disease    functioning at 18%  . Constipation   . Diabetes mellitus without complication ([place])    "borderline"  . GERD (gastroesophageal reflux disease)   . Hypertension   . Paraplegic spinal paralysis ([place])   . Stroke [facility])    left side weakness    Social History Social History  Substance Use Topics  . Smoking status: Never Smoker  . Smokeless tobacco: Never Used  . Alcohol use No    Family History Family History  Problem Relation Age of Onset  . Diabetes Mother     Surgical History Past Surgical History:  Procedure Laterality Date  . AV FISTULA PLACEMENT Right 12/06/2015   Procedure: ARTERIOVENOUS (AV) FISTULA CREATION-RIGHT upper arm ;  Surgeon: Conrad , MD;  Location: [facility];  Service: Vascular;  Laterality: Right;  . COLONOSCOPY N/A 09/30/2014   Procedure: COLONOSCOPY;  Surgeon: Daneil Dolin, MD;  [facility];  Service: Endoscopy;  Laterality: N/A;  2:30 AM - moved to 1:10 - Ginger to notify pt  . CSF SHUNT      No Known Allergies  Current Outpatient Prescriptions  Medication Sig Dispense Refill  . amLODipine (NORVASC) 5 MG tablet Take 5 mg by mouth daily.    Marland Kitchen atorvastatin (LIPITOR) 10 MG tablet Take 10 mg by mouth daily.    . cholecalciferol (VITAMIN D) 1000 UNITS tablet Take 1,000 Units by mouth daily.    . enalapril (VASOTEC) 10 MG tablet Take 10 mg by mouth daily.    Marland Kitchen FLUoxetine (PROZAC) 10 MG capsule Take 10 mg by mouth daily.    . furosemide (LASIX) 40 MG tablet Take 40 mg by mouth daily.     . hydrALAZINE (APRESOLINE) 25 MG tablet Take 25 mg by mouth 3 (three) times daily.    . metoprolol (LOPRESSOR) 50 MG tablet Take 50 mg by mouth 2 (two) times daily.    Marland Kitchen omeprazole (PRILOSEC) 20 MG capsule Take 20 mg by mouth daily.    Marland Kitchen oxyCODONE-acetaminophen (PERCOCET) 5-325 MG tablet Take 1 tablet by mouth every 6 (six) hours as needed for severe pain. 8 tablet 0  . polyethylene glycol (MIRALAX / GLYCOLAX) packet Take 17 g by mouth every Thursday.     Marland Kitchen  travoprost, benzalkonium, (TRAVATAN) 0.004 % ophthalmic solution Place 1 drop into both eyes 3 (three) times daily.     No current facility-administered medications for this visit.      REVIEW OF SYSTEMS: see HPI for pertinent positives and negatives    PHYSICAL EXAMINATION:  Vitals:   02/11/17 1220  BP: 118/79  Pulse: 63  Resp: 16  Temp: 98.1 F (36.7 C)  TempSrc: Oral  SpO2: 98%  Weight: 225 lb (102.1 kg)  Height: 6' (1.829 m)   Body mass index is 30.52 kg/m.  General: The patient appears their stated age, seated in a w/c, accompanied by his mother and a male family member.    HEENT:  No gross abnormalities Pulmonary: Respirations are non-labored, BBS are distant, no rales, rhonchi, or wheezes.  Abdomen: Soft and non-tender with normal pitched  bowel sounds.  Musculoskeletal: There are no major deformities other than mild contractures of a couple of fingers of left hand.  Neurologic: M/S: right upper and lower extremities are 5/5, left upper and lower extremities are 3-4/5 Skin: There are no ulcer or rashes noted. Psychiatric: The patient has a flat affect, verbal response in monosyllables, follows commands appropriately.  Cardiovascular: There is a regular rate and rhythm without significant murmur appreciated. Bilateral PT pedal pulses are 2+ palpable. Left upper arm AV fistula with palpable thrill, no aneurysm. Left radial pulse is 2+ palpable.     Medical Decision Making  [name] is a 56 y.o. male who presents with ESRD, left upper arm AV fistula has no aneurysms, no open wounds, no signs of infiection, has a good palpable thrill, no steal symptoms in left upper extremity.   I reassured pt and his mother that the left upper arm AV fistula seems to be in in good working order with no current problems, is ready for access and HD use.   Follow up with [facility] as needed.   Everly Rubalcava, Sharmon Leyden, RN, MSN, FNP-C [facility] [place] Office: [phone]  02/11/2017, 12:23 PM  Clinic MD: Early on call

## 2017-02-12 ENCOUNTER — Ambulatory Visit: Payer: Medicaid Other | Admitting: Family

## 2017-02-26 ENCOUNTER — Encounter: Payer: Self-pay | Admitting: Vascular Surgery

## 2017-03-13 NOTE — Progress Notes (Deleted)
    Postoperative Access Visit   History of Present Illness  Bryce Miller is a 56 y.o. ([birth year]) male who presents for postoperative follow-up for: L BC AVF (Date: 12/06/15).  The patient's wounds are healed.  The patient notes no steal symptoms.  The patient is able to complete their activities of daily living.  The patient's current symptoms are: none.    Physical Examination ***There were no vitals filed for this visit. ***There is no height or weight on file to calculate BMI.  LUE: Incision is healed, skin feels warm, hand grip is 5/5, sensation in digits is intact, palpable thrill, bruit can be auscultated, visible fistula, On Sonosite: fistula >6 mm throughout  Medical Decision Making  Bryce Miller is a 56 y.o. (1960/11/16) male who presents s/p L BC AVF .  The patient's access is ready for use.  Thank you for allowing [place] to participate in this patient's care.   Adele Barthel, MD, FACS [facility] [place] Office: [phone] Pager: [phone]

## 2017-03-15 ENCOUNTER — Ambulatory Visit: Payer: Medicaid Other | Admitting: Vascular Surgery

## 2017-11-19 ENCOUNTER — Other Ambulatory Visit: Payer: Self-pay

## 2017-11-19 ENCOUNTER — Inpatient Hospital Stay (HOSPITAL_COMMUNITY)
Admission: EM | Admit: 2017-11-19 | Discharge: 2017-11-21 | DRG: 871 | Disposition: A | Discharge status: Home / Self Care | Payer: Medicaid Other | Attending: Internal Medicine | Admitting: Internal Medicine

## 2017-11-19 ENCOUNTER — Encounter (HOSPITAL_COMMUNITY): Payer: Self-pay

## 2017-11-19 ENCOUNTER — Emergency Department (HOSPITAL_COMMUNITY): Payer: Medicaid Other

## 2017-11-19 DIAGNOSIS — I517 Cardiomegaly: Secondary | ICD-10-CM | POA: Diagnosis present

## 2017-11-19 DIAGNOSIS — R509 Fever, unspecified: Secondary | ICD-10-CM

## 2017-11-19 DIAGNOSIS — D631 Anemia in chronic kidney disease: Secondary | ICD-10-CM | POA: Diagnosis present

## 2017-11-19 DIAGNOSIS — Z833 Family history of diabetes mellitus: Secondary | ICD-10-CM | POA: Diagnosis not present

## 2017-11-19 DIAGNOSIS — E1122 Type 2 diabetes mellitus with diabetic chronic kidney disease: Secondary | ICD-10-CM | POA: Diagnosis present

## 2017-11-19 DIAGNOSIS — N186 End stage renal disease: Secondary | ICD-10-CM

## 2017-11-19 DIAGNOSIS — I69328 Other speech and language deficits following cerebral infarction: Secondary | ICD-10-CM | POA: Diagnosis not present

## 2017-11-19 DIAGNOSIS — I69364 Other paralytic syndrome following cerebral infarction affecting left non-dominant side: Secondary | ICD-10-CM

## 2017-11-19 DIAGNOSIS — Z992 Dependence on renal dialysis: Secondary | ICD-10-CM | POA: Diagnosis not present

## 2017-11-19 DIAGNOSIS — I120 Hypertensive chronic kidney disease with stage 5 chronic kidney disease or end stage renal disease: Secondary | ICD-10-CM | POA: Diagnosis present

## 2017-11-19 DIAGNOSIS — A411 Sepsis due to other specified staphylococcus: Principal | ICD-10-CM | POA: Diagnosis present

## 2017-11-19 DIAGNOSIS — M898X9 Other specified disorders of bone, unspecified site: Secondary | ICD-10-CM | POA: Diagnosis present

## 2017-11-19 DIAGNOSIS — G8220 Paraplegia, unspecified: Secondary | ICD-10-CM | POA: Diagnosis not present

## 2017-11-19 DIAGNOSIS — R7881 Bacteremia: Secondary | ICD-10-CM

## 2017-11-19 DIAGNOSIS — K219 Gastro-esophageal reflux disease without esophagitis: Secondary | ICD-10-CM | POA: Diagnosis present

## 2017-11-19 DIAGNOSIS — A419 Sepsis, unspecified organism: Secondary | ICD-10-CM | POA: Diagnosis present

## 2017-11-19 LAB — CBC WITH DIFFERENTIAL/PLATELET
Basophils Absolute: 0 10*3/uL (ref 0.0–0.1)
Basophils Relative: 0 %
Eosinophils Absolute: 0 10*3/uL (ref 0.0–0.7)
Eosinophils Relative: 0 %
HEMATOCRIT: 35.3 % — AB (ref 39.0–52.0)
Hemoglobin: 11.1 g/dL — ABNORMAL LOW (ref 13.0–17.0)
LYMPHS ABS: 0.9 10*3/uL (ref 0.7–4.0)
LYMPHS PCT: 17 %
MCH: 29.1 pg (ref 26.0–34.0)
MCHC: 31.4 g/dL (ref 30.0–36.0)
MCV: 92.4 fL (ref 78.0–100.0)
MONO ABS: 0.5 10*3/uL (ref 0.1–1.0)
Monocytes Relative: 10 %
NEUTROS ABS: 3.8 10*3/uL (ref 1.7–7.7)
Neutrophils Relative %: 73 %
Platelets: 227 10*3/uL (ref 150–400)
RBC: 3.82 MIL/uL — ABNORMAL LOW (ref 4.22–5.81)
RDW: 14.2 % (ref 11.5–15.5)
WBC: 5.3 10*3/uL (ref 4.0–10.5)

## 2017-11-19 LAB — COMPREHENSIVE METABOLIC PANEL
ALT: 12 U/L — AB (ref 17–63)
AST: 15 U/L (ref 15–41)
Albumin: 3.6 g/dL (ref 3.5–5.0)
Alkaline Phosphatase: 55 U/L (ref 38–126)
Anion gap: 13 (ref 5–15)
BUN: 20 mg/dL (ref 6–20)
CHLORIDE: 95 mmol/L — AB (ref 101–111)
CO2: 30 mmol/L (ref 22–32)
CREATININE: 4.42 mg/dL — AB (ref 0.61–1.24)
Calcium: 8.4 mg/dL — ABNORMAL LOW (ref 8.9–10.3)
GFR calc Af Amer: 16 mL/min — ABNORMAL LOW (ref >60)
GFR, EST NON AFRICAN AMERICAN: 14 mL/min — AB (ref >60)
Glucose, Bld: 94 mg/dL (ref 65–99)
Potassium: 3.6 mmol/L (ref 3.5–5.1)
SODIUM: 138 mmol/L (ref 135–145)
Total Bilirubin: 0.6 mg/dL (ref 0.3–1.2)
Total Protein: 7.9 g/dL (ref 6.5–8.1)

## 2017-11-19 LAB — URINALYSIS, ROUTINE W REFLEX MICROSCOPIC
Bacteria, UA: NONE SEEN
Bilirubin Urine: NEGATIVE
Glucose, UA: NEGATIVE mg/dL
KETONES UR: NEGATIVE mg/dL
Leukocytes, UA: NEGATIVE
Nitrite: NEGATIVE
PROTEIN: 100 mg/dL — AB
Specific Gravity, Urine: 1.014 (ref 1.005–1.030)
pH: 7 (ref 5.0–8.0)

## 2017-11-19 LAB — I-STAT CG4 LACTIC ACID, ED
LACTIC ACID, VENOUS: 0.82 mmol/L (ref 0.5–1.9)
Lactic Acid, Venous: 2.51 mmol/L (ref 0.5–1.9)

## 2017-11-19 LAB — PROTIME-INR
INR: 1.09
Prothrombin Time: 14 seconds (ref 11.4–15.2)

## 2017-11-19 MED ORDER — ONDANSETRON HCL 4 MG/2ML IJ SOLN: 4.0000 mg | Freq: Four times a day (QID) | INTRAMUSCULAR | Status: DC | PRN

## 2017-11-19 MED ORDER — TRAVOPROST 0.004 % OP SOLN
1.0000 [drp] | Freq: Three times a day (TID) | OPHTHALMIC | Status: DC
  Filled 2017-11-19: qty 2.5

## 2017-11-19 MED ORDER — SODIUM CHLORIDE 0.9% FLUSH: 3.0000 mL | INTRAVENOUS | Status: DC | PRN

## 2017-11-19 MED ORDER — VANCOMYCIN HCL IN DEXTROSE 1-5 GM/200ML-% IV SOLN: 1000.0000 mg | Freq: Once | INTRAVENOUS | Status: DC

## 2017-11-19 MED ORDER — SODIUM CHLORIDE 0.9 % IV SOLN: 250.0000 mL | INTRAVENOUS | Status: DC | PRN

## 2017-11-19 MED ORDER — VANCOMYCIN HCL 10 G IV SOLR
2000.0000 mg | Freq: Once | INTRAVENOUS | Status: AC
  Administered 2017-11-19: 2000 mg via INTRAVENOUS
  Filled 2017-11-19: qty 2000

## 2017-11-19 MED ORDER — VITAMIN D 1000 UNITS PO TABS
1000.0000 [IU] | ORAL_TABLET | Freq: Every day | ORAL | Status: DC
  Administered 2017-11-20 – 2017-11-21 (×2): 1000 [IU] via ORAL
  Filled 2017-11-19 (×2): qty 1

## 2017-11-19 MED ORDER — SODIUM CHLORIDE 0.9 % IV SOLN: 2.0000 g | Freq: Once | INTRAVENOUS | Status: DC

## 2017-11-19 MED ORDER — OXYCODONE-ACETAMINOPHEN 5-325 MG PO TABS
1.0000 | ORAL_TABLET | Freq: Four times a day (QID) | ORAL | Status: DC | PRN
  Filled 2017-11-19: qty 1

## 2017-11-19 MED ORDER — ENALAPRIL MALEATE 5 MG PO TABS
10.0000 mg | ORAL_TABLET | Freq: Every day | ORAL | Status: DC
  Administered 2017-11-19 – 2017-11-20 (×2): 10 mg via ORAL
  Filled 2017-11-19 (×3): qty 2

## 2017-11-19 MED ORDER — PANTOPRAZOLE SODIUM 40 MG PO TBEC
40.0000 mg | DELAYED_RELEASE_TABLET | Freq: Every day | ORAL | Status: DC
  Administered 2017-11-20 – 2017-11-21 (×2): 40 mg via ORAL
  Filled 2017-11-19 (×2): qty 1

## 2017-11-19 MED ORDER — HEPARIN SODIUM (PORCINE) 5000 UNIT/ML IJ SOLN
5000.0000 [IU] | Freq: Three times a day (TID) | INTRAMUSCULAR | Status: DC
  Administered 2017-11-19 – 2017-11-21 (×4): 5000 [IU] via SUBCUTANEOUS
  Filled 2017-11-19 (×5): qty 1

## 2017-11-19 MED ORDER — ACETAMINOPHEN 650 MG RE SUPP: 650.0000 mg | Freq: Four times a day (QID) | RECTAL | Status: DC | PRN

## 2017-11-19 MED ORDER — ACETAMINOPHEN 500 MG PO TABS
1000.0000 mg | ORAL_TABLET | Freq: Once | ORAL | Status: AC
  Administered 2017-11-19: 1000 mg via ORAL
  Filled 2017-11-19: qty 2

## 2017-11-19 MED ORDER — ONDANSETRON HCL 4 MG PO TABS: 4.0000 mg | ORAL_TABLET | Freq: Four times a day (QID) | ORAL | Status: DC | PRN

## 2017-11-19 MED ORDER — SODIUM CHLORIDE 0.9% FLUSH
3.0000 mL | Freq: Two times a day (BID) | INTRAVENOUS | Status: DC
  Administered 2017-11-19 – 2017-11-21 (×3): 3 mL via INTRAVENOUS

## 2017-11-19 MED ORDER — FUROSEMIDE 40 MG PO TABS
40.0000 mg | ORAL_TABLET | Freq: Every day | ORAL | Status: DC
  Administered 2017-11-20 – 2017-11-21 (×2): 40 mg via ORAL
  Filled 2017-11-19 (×3): qty 1

## 2017-11-19 MED ORDER — AMLODIPINE BESYLATE 5 MG PO TABS
5.0000 mg | ORAL_TABLET | Freq: Every day | ORAL | Status: DC
  Administered 2017-11-20: 5 mg via ORAL
  Filled 2017-11-19 (×2): qty 1

## 2017-11-19 MED ORDER — ATORVASTATIN CALCIUM 10 MG PO TABS
10.0000 mg | ORAL_TABLET | Freq: Every day | ORAL | Status: DC
  Administered 2017-11-20 – 2017-11-21 (×2): 10 mg via ORAL
  Filled 2017-11-19 (×3): qty 1

## 2017-11-19 MED ORDER — METOPROLOL TARTRATE 50 MG PO TABS
50.0000 mg | ORAL_TABLET | Freq: Two times a day (BID) | ORAL | Status: DC
  Administered 2017-11-19 – 2017-11-20 (×3): 50 mg via ORAL
  Filled 2017-11-19 (×4): qty 1

## 2017-11-19 MED ORDER — ACETAMINOPHEN 325 MG PO TABS: 650.0000 mg | ORAL_TABLET | Freq: Four times a day (QID) | ORAL | Status: DC | PRN

## 2017-11-19 MED ORDER — POLYETHYLENE GLYCOL 3350 17 G PO PACK
17.0000 g | PACK | ORAL | Status: DC
  Administered 2017-11-21: 17 g via ORAL
  Filled 2017-11-19: qty 1

## 2017-11-19 MED ORDER — SODIUM CHLORIDE 0.9 % IV SOLN
2.0000 g | Freq: Once | INTRAVENOUS | Status: AC
  Administered 2017-11-19: 2 g via INTRAVENOUS
  Filled 2017-11-19: qty 2

## 2017-11-19 MED ORDER — HYDRALAZINE HCL 25 MG PO TABS
25.0000 mg | ORAL_TABLET | Freq: Three times a day (TID) | ORAL | Status: DC
  Administered 2017-11-19 – 2017-11-20 (×3): 25 mg via ORAL
  Filled 2017-11-19 (×4): qty 1

## 2017-11-19 MED ORDER — FLUOXETINE HCL 10 MG PO CAPS
10.0000 mg | ORAL_CAPSULE | Freq: Every day | ORAL | Status: DC
  Administered 2017-11-20 – 2017-11-21 (×2): 10 mg via ORAL
  Filled 2017-11-19 (×3): qty 1

## 2017-11-19 NOTE — ED Triage Notes (Signed)
EMS reports pt was at dialysis and prior to treatment he started having fever and chills.  Pt reports symptoms for the past few days.  Staff told ems pt's temp was 100.1

## 2017-11-19 NOTE — H&P (Signed)
History and Physical    Bryce Miller JSE:[id number] DOB: 1960-12-04 DOA: 11/19/2017  PCP: Rosita Fire, MD   Patient coming from: Dialysis  Chief Complaint: Fever  HPI: Bryce Miller is a 57 y.o. 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  He developed this temperature after completing hemodialysis.  He is a poor historian with limited ability to speak, but appears to deny any symptomatic complaints or concerns.   ED Course: Vital signs in the ED are noted to be stable with temperature 102.2 Fahrenheit.  Laboratory data with no leukocytosis, but lactic acid is 2.51.  Other labs are within normal limits.  Chest x-ray with mild cardiomegaly and vascular congestion but no infiltrate.  Urine has not been collected.  Blood cultures have been collected x2 sets with initiation of vancomycin and cefepime.  Review of Systems: Cannot be adequately obtained due to patient condition.  Past Medical History:  Diagnosis Date  . Chronic kidney disease    functioning at 18%  . Constipation   . Diabetes mellitus without complication ([place])    "borderline"  . GERD (gastroesophageal reflux disease)   . Hypertension   . Paraplegic spinal paralysis ([place])   . Stroke [facility])    left side weakness    Past Surgical History:  Procedure Laterality Date  . AV FISTULA PLACEMENT Right 12/06/2015   Procedure: ARTERIOVENOUS (AV) FISTULA CREATION-RIGHT upper arm ;  Surgeon: Conrad [place], MD;  Location: [facility];  Service: Vascular;  Laterality: Right;  . COLONOSCOPY N/A 09/30/2014   Procedure: COLONOSCOPY;  Surgeon: Daneil Dolin, MD;  [facility];  Service: Endoscopy;  Laterality: N/A;  2:30 AM - moved to 1:10 - Ginger to notify pt  . CSF SHUNT       reports that he has never  smoked. He has never used smokeless tobacco. He reports that he has current or past drug history. Drug: Cocaine. He reports that he does not drink alcohol.  No Known Allergies  Family History  Problem Relation Age of Onset  . Diabetes Mother     Prior to Admission medications   Medication Sig Start Date End Date Taking? Authorizing Provider  amLODipine (NORVASC) 5 MG tablet Take 5 mg by mouth daily.    [provider]  atorvastatin (LIPITOR) 10 MG tablet Take 10 mg by mouth daily.    [provider]  cholecalciferol (VITAMIN D) 1000 UNITS tablet Take 1,000 Units by mouth daily.    [provider]  enalapril (VASOTEC) 10 MG tablet Take 10 mg by mouth daily.    [provider]  FLUoxetine (PROZAC) 10 MG capsule Take 10 mg by mouth daily.    [provider]  furosemide (LASIX) 40 MG tablet Take 40 mg by mouth daily.     [provider]  hydrALAZINE (APRESOLINE) 25 MG tablet Take 25 mg by mouth 3 (three) times daily.    [provider]  metoprolol (LOPRESSOR) 50 MG tablet Take 50 mg by mouth 2 (two) times daily.    [provider]  omeprazole (PRILOSEC) 20 MG capsule Take 20 mg by mouth daily.    [provider]  oxyCODONE-acetaminophen (PERCOCET) 5-325 MG tablet Take 1 tablet by mouth every 6 (six) hours as needed for severe pain. 12/06/15   Gabriel Earing,  PA-C  polyethylene glycol (MIRALAX / GLYCOLAX) packet Take 17 g by mouth every Thursday.     [provider]  travoprost, benzalkonium, (TRAVATAN) 0.004 % ophthalmic solution Place 1 drop into both eyes 3 (three) times daily.    [provider]    Physical Exam: Vitals:   11/19/17 1439 11/19/17 1442 11/19/17 1615  BP:  (!) 126/91 129/83  Pulse:  100 (!) 102  Resp:  20 17  Temp:  (!) 102.2 F (39 C)   TempSrc:  Rectal   SpO2:  100% 95%  Weight: 102.1 kg (225 lb)    Height: 5\' 10"  (1.778 m)      Constitutional: NAD, calm,  comfortable Vitals:   11/19/17 1439 11/19/17 1442 11/19/17 1615  BP:  (!) 126/91 129/83  Pulse:  100 (!) 102  Resp:  20 17  Temp:  (!) 102.2 F (39 C)   TempSrc:  Rectal   SpO2:  100% 95%  Weight: 102.1 kg (225 lb)    Height: 5\' 10"  (1.778 m)     Eyes: lids and conjunctivae normal ENMT: Mucous membranes are moist.  Neck: normal, supple Respiratory: clear to auscultation bilaterally. Normal respiratory effort. No accessory muscle use. On RA. Cardiovascular: Regular rate and rhythm, no murmurs. No extremity edema. Abdomen: no tenderness, no distention. Bowel sounds positive.  Musculoskeletal:  No joint deformity upper and lower extremities.   Skin: no rashes, lesions, ulcers.   Labs on Admission: I have personally reviewed following labs and imaging studies  CBC: Recent Labs  Lab 11/19/17 1526  WBC 5.3  NEUTROABS 3.8  HGB 11.1*  HCT 35.3*  MCV 92.4  PLT 818   Basic Metabolic Panel: Recent Labs  Lab 11/19/17 1526  NA 138  K 3.6  CL 95*  CO2 30  GLUCOSE 94  BUN 20  CREATININE 4.42*  CALCIUM 8.4*   GFR: Estimated Creatinine Clearance: 22.6 mL/min (A) (by C-G formula based on SCr of 4.42 mg/dL (H)). Liver Function Tests: Recent Labs  Lab 11/19/17 1526  AST 15  ALT 12*  ALKPHOS 55  BILITOT 0.6  PROT 7.9  ALBUMIN 3.6   No results for input(s): LIPASE, AMYLASE in the last 168 hours. No results for input(s): AMMONIA in the last 168 hours. Coagulation Profile: Recent Labs  Lab 11/19/17 1526  INR 1.09   Cardiac Enzymes: No results for input(s): CKTOTAL, CKMB, CKMBINDEX, TROPONINI in the last 168 hours. BNP (last 3 results) No results for input(s): PROBNP in the last 8760 hours. HbA1C: No results for input(s): HGBA1C in the last 72 hours. CBG: No results for input(s): GLUCAP in the last 168 hours. Lipid Profile: No results for input(s): CHOL, HDL, LDLCALC, TRIG, CHOLHDL, LDLDIRECT in the last 72 hours. Thyroid Function Tests: No results for input(s):  TSH, T4TOTAL, FREET4, T3FREE, THYROIDAB in the last 72 hours. Anemia Panel: No results for input(s): VITAMINB12, FOLATE, FERRITIN, TIBC, IRON, RETICCTPCT in the last 72 hours. Urine analysis: No results found for: COLORURINE, APPEARANCEUR, LABSPEC, [place], GLUCOSEU, HGBUR, BILIRUBINUR, KETONESUR, PROTEINUR, UROBILINOGEN, NITRITE, LEUKOCYTESUR  Radiological Exams on Admission: Dg Chest Portable 1 View  Result Date: 11/19/2017 CLINICAL DATA:  fever EXAM: PORTABLE CHEST 1 VIEW COMPARISON:  None. FINDINGS: The heart is mildly enlarged. Low lung volumes. Vascular congestion. Lungs are grossly clear. No pneumothorax or pleural effusion. IMPRESSION: Mild cardiomegaly and vascular congestion. Electronically Signed   By: Marybelle Killings M.D.   On: 11/19/2017 15:18    EKG: Independently reviewed. ST at 102 bpm  with L axis deviation.  Assessment/Plan Principal Problem:   Fever Active Problems:   ESRD (end stage renal disease) ([place])   Paraplegic spinal paralysis ([place])    1. Fever with possible sepsis.  Continue with empiric vancomycin and cefepime with blood cultures pending.  No significant pulmonary issues noted and will attempt to collect urine culture since it appears that he might make some urine.  Repeat lactic in a.m. 2. ESRD on HD.  He has completed his hemodialysis today.  Spoke with Dr. Lowanda Foster regarding status of hemodialysis who had updated me on this fact.  Patient apparently developed fever after dialysis session was over.  Nephrology will see in consultation for hemodialysis needs while inpatient. 3. Paraplegic spinal paralysis with speech deficit.  SLP evaluation. Start dysphagia diet for now. 4. History of diabetes.  Not currently on home medication.  Currently well controlled.  Will hold on SSI for now and initiate if he starts to become hyperglycemic.   DVT prophylaxis: Heparin Code Status: Full Family Communication: None at bedside Disposition Plan:Fever evaluation with empiric  IV antibiotics Consults called:Nephrology for HD Admission status: Inpatient, med-surg   Pratik Darleen Crocker DO [facility] Pager [phone]  If 7PM-7AM, please contact night-coverage [email or web address] Password [email or web address]  11/19/2017, 5:11 PM

## 2017-11-19 NOTE — ED Notes (Signed)
Vanc stopped. IV flushed and saline locked.

## 2017-11-19 NOTE — ED Notes (Signed)
CRITICAL VALUE ALERT  Critical Value:  istat lactic 2.51  Date & Time Notied:  11/19/2017 1541  Provider Notified: Dr. Sabra Heck  Orders Received/Actions taken:  See chart

## 2017-11-19 NOTE — ED Notes (Signed)
Pt placed on the cardiac monitor.

## 2017-11-19 NOTE — ED Provider Notes (Signed)
[facility] EMERGENCY DEPARTMENT Provider Note   CSN: [id number] Arrival date & time: 11/19/17  1429     History   Chief Complaint Chief Complaint  Patient presents with  . Fever    HPI Vera Wishart is a 57 y.o. male.  HPI  57 year old male, currently being treated for diabetes, chronic kidney disease on end-stage renal disease with dialysis, has had a paraplegic spinal paralysis and stroke and has chronic left sided weakness.  He presents to the hospital today from the dialysis center.  The patient is a very poor historian and has limited ability to speak, level 5 caveat applies.  Information obtained from the paramedics was that the patient is started on dialysis but became febrile, dialysis was stopped and the patient was sent to the emergency department after the fistula was dressed with a pressure dressing.  There was no bleeding, the patient has no complaints, the family members were here with the patient also states that he has no complaints and has been his normal self.  Past Medical History:  Diagnosis Date  . Chronic kidney disease    functioning at 18%  . Constipation   . Diabetes mellitus without complication ([place])    "borderline"  . GERD (gastroesophageal reflux disease)   . Hypertension   . Paraplegic spinal paralysis ([place])   . Stroke [facility])    left side weakness    Patient Active Problem List   Diagnosis Date Noted  . Chronic kidney disease (CKD), stage IV (severe) ([place]) 01/19/2016  . Colon cancer screening     Past Surgical History:  Procedure Laterality Date  . AV FISTULA PLACEMENT Right 12/06/2015   Procedure: ARTERIOVENOUS (AV) FISTULA CREATION-RIGHT upper arm ;  Surgeon: Conrad [place], MD;  Location: [place];  Service: Vascular;  Laterality: Right;  . COLONOSCOPY N/A 09/30/2014   Procedure: COLONOSCOPY;  Surgeon: Daneil Dolin, MD;  [facility];  Service: Endoscopy;  Laterality: N/A;  2:30 AM - moved to 1:10 - Ginger to notify pt  . CSF  SHUNT          Home Medications    Prior to Admission medications   Medication Sig Start Date End Date Taking? Authorizing Provider  amLODipine (NORVASC) 5 MG tablet Take 5 mg by mouth daily.    [provider]  atorvastatin (LIPITOR) 10 MG tablet Take 10 mg by mouth daily.    [provider]  cholecalciferol (VITAMIN D) 1000 UNITS tablet Take 1,000 Units by mouth daily.    [provider]  enalapril (VASOTEC) 10 MG tablet Take 10 mg by mouth daily.    [provider]  FLUoxetine (PROZAC) 10 MG capsule Take 10 mg by mouth daily.    [provider]  furosemide (LASIX) 40 MG tablet Take 40 mg by mouth daily.     [provider]  hydrALAZINE (APRESOLINE) 25 MG tablet Take 25 mg by mouth 3 (three) times daily.    [provider]  metoprolol (LOPRESSOR) 50 MG tablet Take 50 mg by mouth 2 (two) times daily.    [provider]  omeprazole (PRILOSEC) 20 MG capsule Take 20 mg by mouth daily.    [provider]  oxyCODONE-acetaminophen (PERCOCET) 5-325 MG tablet Take 1 tablet by mouth every 6 (six) hours as needed for severe pain. 12/06/15   Rhyne, Hulen Shouts, PA-C  polyethylene glycol (MIRALAX / GLYCOLAX) packet Take 17 g by mouth every Thursday.     [provider]  travoprost, benzalkonium, (TRAVATAN) 0.004 % ophthalmic solution Place 1 drop into both eyes 3 (three) times daily.    [provider]    Family History Family History  Problem Relation Age of Onset  . Diabetes Mother     Social History Social History   Tobacco Use  . Smoking status: Never Smoker  . Smokeless tobacco: Never Used  Substance Use Topics  . Alcohol use: No  . Drug use: Yes    Types: Cocaine    Comment: none since 2012     Allergies   Patient has no known allergies.   Review of Systems Review of Systems  Unable to perform ROS: Other     Physical Exam Updated Vital Signs BP 129/83   Pulse (!) 102    Temp (!) 102.2 F (39 C) (Rectal)   Resp 17   Ht 5\' 10"  (1.778 m)   Wt 102.1 kg (225 lb)   SpO2 95%   BMI 32.28 kg/m   Physical Exam  Constitutional: He appears well-developed and well-nourished. No distress.  HENT:  Head: Normocephalic and atraumatic.  Mouth/Throat: Oropharynx is clear and moist. No oropharyngeal exudate.  Eyes: Pupils are equal, round, and reactive to light. Conjunctivae and EOM are normal. Right eye exhibits no discharge. Left eye exhibits no discharge. No scleral icterus.  Neck: Normal range of motion. Neck supple. No JVD present. No thyromegaly present.  Cardiovascular: Normal rate, regular rhythm, normal heart sounds and intact distal pulses. Exam reveals no gallop and no friction rub.  No murmur heard. Pulmonary/Chest: Effort normal and breath sounds normal. No respiratory distress. He has no wheezes. He has no rales.  Abdominal: Soft. Bowel sounds are normal. He exhibits no distension and no mass. There is no tenderness.  Musculoskeletal: Normal range of motion. He exhibits no edema, tenderness or deformity.  Flexion contracture of the left arm, weakness of the left lower extremity compared to right side.  Speech is slowed  Lymphadenopathy:    He has no cervical adenopathy.  Neurological: He is alert. Coordination normal.  Skin: Skin is warm and dry. No rash noted. No erythema.  No redness or erythema or induration overlying the fistula, good thrill, no other rashes to the skin, the patient is warm to the touch  Psychiatric: He has a normal mood and affect. His behavior is normal.  Nursing note and vitals reviewed.    ED Treatments / Results  Labs (all labs ordered are listed, but only abnormal results are displayed) Labs Reviewed  COMPREHENSIVE METABOLIC PANEL - Abnormal; Notable for the following components:      Result Value   Chloride 95 (*)    Creatinine, Ser 4.42 (*)    Calcium 8.4 (*)    ALT 12 (*)    GFR calc non Af Amer 14 (*)    GFR calc  Af Amer 16 (*)    All other components within normal limits  CBC WITH DIFFERENTIAL/PLATELET - Abnormal; Notable for the following components:   RBC 3.82 (*)    Hemoglobin 11.1 (*)    HCT 35.3 (*)    All other components within normal limits  I-STAT CG4 LACTIC ACID, ED - Abnormal; Notable for the following components:   Lactic Acid, Venous 2.51 (*)    All other components within normal limits  CULTURE, BLOOD (ROUTINE X 2)  CULTURE, BLOOD (ROUTINE X 2)  PROTIME-INR  URINALYSIS, ROUTINE W REFLEX MICROSCOPIC  I-STAT CG4 LACTIC ACID, ED    EKG None  Radiology Dg Chest Portable 1 View  Result Date: 11/19/2017 CLINICAL DATA:  fever EXAM: PORTABLE CHEST 1 VIEW COMPARISON:  None. FINDINGS: The heart is mildly enlarged. Low lung volumes. Vascular congestion. Lungs are grossly clear. No pneumothorax or pleural effusion. IMPRESSION: Mild cardiomegaly and vascular congestion. Electronically Signed   By: Marybelle Killings M.D.   On: 11/19/2017 15:18    Procedures Procedures (including critical care time)  Medications Ordered in ED Medications  vancomycin (VANCOCIN) 2,000 mg in sodium chloride 0.9 % 500 mL IVPB (2,000 mg Intravenous New Bag/Given 11/19/17 1604)  acetaminophen (TYLENOL) tablet 1,000 mg (has no administration in time range)  ceFEPIme (MAXIPIME) 2 g in sodium chloride 0.9 % 100 mL IVPB (0 g Intravenous Stopped 11/19/17 1620)     Initial Impression / Assessment and Plan / ED Course  I have reviewed the triage vital signs and the nursing notes.  Pertinent labs & imaging results that were available during my care of the patient were reviewed by me and considered in my medical decision making (see chart for details).     Blood cultures were obtained due to the thought that this could be bacteremia.  He does not have a leukocytosis, he is not tachycardic, he is not hypotensive, he is not hypoxic and he has baseline renal dysfunction which requires dialysis.  I do not see any endorgan  injury, I do not see any signs of a source of infection, as he could be bacteremic I have asked the hospitalist to admit him for observation.  At this time he does not fit into a category of sepsis, he does not have a lactic acid over 4, he does not have any complaints at this time  Final Clinical Impressions(s) / ED Diagnoses   Final diagnoses:  Fever, unspecified fever cause  Bacteremia    ED Discharge Orders    None       Noemi Chapel, MD 11/19/17 1655

## 2017-11-19 NOTE — Progress Notes (Signed)
Pharmacy Antibiotic Note  Bryce Miller is a 57 y.o. male admitted on 11/19/2017 with sepsis.  Pharmacy has been consulted for Vancomycin and cefepime dosing.  Plan: Vancomycin 2gm IV loading dose, then 1000mg  after every HD session Cefepime 2gm IV now, and after every HD session F/u cxs and clinical progress Monitor V/S and labs, levels as indicated  Height: 5\' 10"  (177.8 cm) Weight: 225 lb (102.1 kg) IBW/kg (Calculated) : 73  Temp (24hrs), Avg:100.7 F (38.2 C), Min:99.2 F (37.3 C), Max:102.2 F (39 C)  Recent Labs  Lab 11/19/17 1526 11/19/17 1538 11/19/17 1712  WBC 5.3  --   --   CREATININE 4.42*  --   --   LATICACIDVEN  --  2.51* 0.82    Estimated Creatinine Clearance: 22.6 mL/min (A) (by C-G formula based on SCr of 4.42 mg/dL (H)).    No Known Allergies  Antimicrobials this admission: Vancomycin 4/16 >>  Cefepime 4/16>>  Dose adjustments this admission: NA  Microbiology results: 4/16 BCx: pending 4/16 UCx: pending  Thank you for allowing pharmacy to be a part of this patient's care.  Isac Sarna, BS [place], [place] Clinical Pharmacist Pager [phone] 11/19/2017 6:56 PM

## 2017-11-19 NOTE — Progress Notes (Signed)
Report received from ER nurse. Patient to transfer up to room 333 after shift change per admission protocol. Donavan Foil, RN

## 2017-11-19 NOTE — ED Notes (Signed)
Pt states he is aware of when he has to urinate and he will inform nursing staff when able to provide sample.

## 2017-11-20 DIAGNOSIS — N186 End stage renal disease: Secondary | ICD-10-CM

## 2017-11-20 DIAGNOSIS — R7881 Bacteremia: Secondary | ICD-10-CM

## 2017-11-20 DIAGNOSIS — G8220 Paraplegia, unspecified: Secondary | ICD-10-CM

## 2017-11-20 DIAGNOSIS — R509 Fever, unspecified: Secondary | ICD-10-CM

## 2017-11-20 LAB — CBC
HEMATOCRIT: 33.2 % — AB (ref 39.0–52.0)
HEMOGLOBIN: 10.5 g/dL — AB (ref 13.0–17.0)
MCH: 28.8 pg (ref 26.0–34.0)
MCHC: 31.6 g/dL (ref 30.0–36.0)
MCV: 91.2 fL (ref 78.0–100.0)
Platelets: 232 10*3/uL (ref 150–400)
RBC: 3.64 MIL/uL — AB (ref 4.22–5.81)
RDW: 13.9 % (ref 11.5–15.5)
WBC: 4.5 10*3/uL (ref 4.0–10.5)

## 2017-11-20 LAB — BLOOD CULTURE ID PANEL (REFLEXED)
ACINETOBACTER BAUMANNII: NOT DETECTED
CANDIDA ALBICANS: NOT DETECTED
CANDIDA GLABRATA: NOT DETECTED
CANDIDA PARAPSILOSIS: NOT DETECTED
Candida krusei: NOT DETECTED
Candida tropicalis: NOT DETECTED
ENTEROBACTER CLOACAE COMPLEX: NOT DETECTED
ENTEROBACTERIACEAE SPECIES: NOT DETECTED
ESCHERICHIA COLI: NOT DETECTED
Enterococcus species: NOT DETECTED
HAEMOPHILUS INFLUENZAE: NOT DETECTED
KLEBSIELLA OXYTOCA: NOT DETECTED
KLEBSIELLA PNEUMONIAE: NOT DETECTED
Listeria monocytogenes: NOT DETECTED
METHICILLIN RESISTANCE: DETECTED — AB
Neisseria meningitidis: NOT DETECTED
Proteus species: NOT DETECTED
Pseudomonas aeruginosa: NOT DETECTED
STREPTOCOCCUS PNEUMONIAE: NOT DETECTED
STREPTOCOCCUS PYOGENES: NOT DETECTED
STREPTOCOCCUS SPECIES: NOT DETECTED
Serratia marcescens: NOT DETECTED
Staphylococcus aureus (BCID): NOT DETECTED
Staphylococcus species: DETECTED — AB
Streptococcus agalactiae: NOT DETECTED

## 2017-11-20 LAB — BASIC METABOLIC PANEL
ANION GAP: 13 (ref 5–15)
BUN: 30 mg/dL — ABNORMAL HIGH (ref 6–20)
CHLORIDE: 97 mmol/L — AB (ref 101–111)
CO2: 27 mmol/L (ref 22–32)
Calcium: 8.1 mg/dL — ABNORMAL LOW (ref 8.9–10.3)
Creatinine, Ser: 5.66 mg/dL — ABNORMAL HIGH (ref 0.61–1.24)
GFR calc non Af Amer: 10 mL/min — ABNORMAL LOW (ref >60)
GFR, EST AFRICAN AMERICAN: 12 mL/min — AB (ref >60)
GLUCOSE: 84 mg/dL (ref 65–99)
POTASSIUM: 3.8 mmol/L (ref 3.5–5.1)
Sodium: 137 mmol/L (ref 135–145)

## 2017-11-20 LAB — LACTIC ACID, PLASMA: LACTIC ACID, VENOUS: 0.6 mmol/L (ref 0.5–1.9)

## 2017-11-20 LAB — GLUCOSE, CAPILLARY: Glucose-Capillary: 85 mg/dL (ref 65–99)

## 2017-11-20 LAB — HIV ANTIBODY (ROUTINE TESTING W REFLEX): HIV Screen 4th Generation wRfx: NONREACTIVE

## 2017-11-20 MED ORDER — EPOETIN ALFA 2000 UNIT/ML IJ SOLN
2000.0000 [IU] | INTRAMUSCULAR | Status: DC
  Administered 2017-11-21: 2000 [IU] via SUBCUTANEOUS
  Filled 2017-11-20: qty 1

## 2017-11-20 MED ORDER — LATANOPROST 0.005 % OP SOLN
1.0000 [drp] | Freq: Every day | OPHTHALMIC | Status: DC
  Administered 2017-11-20: 1 [drp] via OPHTHALMIC
  Filled 2017-11-20: qty 2.5

## 2017-11-20 MED ORDER — VANCOMYCIN HCL IN DEXTROSE 1-5 GM/200ML-% IV SOLN
1000.0000 mg | INTRAVENOUS | Status: DC
  Administered 2017-11-21: 1000 mg via INTRAVENOUS
  Filled 2017-11-20: qty 200

## 2017-11-20 NOTE — Consult Note (Addendum)
Bryce Miller MRN: [id number] DOB/AGE: [birth year] 57 y.o. Primary Care Physician:Fanta, Tesfaye, MD Admit date: 11/19/2017 Chief Complaint:  Chief Complaint  Patient presents with  . Fever   HPI: Pt is a 57 year old African American male with past medical history hypertension, paraplegic spinal paralysisfrom prior spinal cord CVA with speech deficit,ESRD on HD who was sent  from hemodialysis unit secondary to Fever.  HPI dates back to yesterday when after completing his dialysis tx pt developed fever.There was no report of any other issues. Upon evaluation in ER pt was found to be febrile with temperature 102.2 Fahrenheit.  Pt also had Lactic  acid of 2.51.  Pt was admitted for further tx  Pt was started on broad spectrum antibiotics vancomycin and cefepime Nephrology was consulted today. Pt seen on 3rd floor.  Pt is a poor historian with limited ability to speak,does not offer concerns.  NO c/o chest pain NO c/o nausea/vomiting/diarrhea NO c/o abdominal pain    Past Medical History:  Diagnosis Date  . Chronic kidney disease    functioning at 18%  . Constipation   . Diabetes mellitus without complication ([place])    "borderline"  . GERD (gastroesophageal reflux disease)   . Hypertension   . Paraplegic spinal paralysis ([place])   . Stroke [facility])    left side weakness        Family History  Problem Relation Age of Onset  . Diabetes Mother     Social History:  reports that he has never smoked. He has never used smokeless tobacco. He reports that he has current or past drug history. Drug: Cocaine. He reports that he does not drink alcohol.   Allergies: No Known Allergies  Medications Prior to Admission  Medication Sig Dispense Refill  . amLODipine (NORVASC) 5 MG tablet Take 5 mg by mouth daily.    . calcitRIOL (ROCALTROL) 0.25 MCG capsule Take 0.25 mcg by mouth daily.    . cholecalciferol (VITAMIN D) 1000 UNITS tablet Take 1,000 Units by mouth daily.    . enalapril  (VASOTEC) 10 MG tablet Take 10 mg by mouth daily.    . furosemide (LASIX) 40 MG tablet Take 40 mg by mouth daily.     . hydrALAZINE (APRESOLINE) 50 MG tablet Take 50 mg by mouth 3 (three) times daily.     . metoprolol (LOPRESSOR) 50 MG tablet Take 25 mg by mouth daily.     Marland Kitchen omeprazole (PRILOSEC) 20 MG capsule Take 20 mg by mouth daily.    . polyethylene glycol (MIRALAX / GLYCOLAX) packet Take 17 g by mouth every Thursday.     . sodium bicarbonate 650 MG tablet Take 1,300 mg by mouth 2 (two) times daily.         RCV:ELFY besides the HPI  . amLODipine  5 mg Oral Daily  . atorvastatin  10 mg Oral Daily  . cholecalciferol  1,000 Units Oral Daily  . enalapril  10 mg Oral Daily  . FLUoxetine  10 mg Oral Daily  . furosemide  40 mg Oral Daily  . heparin  5,000 Units Subcutaneous Q8H  . hydrALAZINE  25 mg Oral TID  . latanoprost  1 drop Both Eyes QHS  . metoprolol tartrate  50 mg Oral BID  . pantoprazole  40 mg Oral Daily  . [START ON 11/21/2017] polyethylene glycol  17 g Oral Q Thu  . sodium chloride flush  3 mL Intravenous Q12H      Physical Exam: Vital signs in last  24 hours: Temp:  [98.5 F (36.9 C)-100.1 F (37.8 C)] 98.7 F (37.1 C) (04/17 1256) Pulse Rate:  [81-102] 81 (04/17 1253) Resp:  [14-18] 16 (04/17 1253) BP: (108-151)/(77-94) 108/83 (04/17 1253) SpO2:  [94 %-100 %] 100 % (04/17 1253) Weight:  [204 lb 2.3 oz (92.6 kg)] 204 lb 2.3 oz (92.6 kg) (04/16 2018) Weight change:  Last BM Date: 11/18/17  Intake/Output from previous day: 04/16 0701 - 04/17 0700 In: 500 [IV Piggyback:500] Out: -  Total I/O In: 240 [P.O.:240] Out: -    Physical Exam: General- pt is awake,alert, oriented to time place and person Resp- No acute REsp distress, CTA B/L NO Rhonchi CVS- S1S2 regular in rate and rhythm GIT- BS+, soft, NT, ND EXT- NO LE Edema, Cyanosis Access-  AVF+   Lab Results: CBC Recent Labs    11/19/17 1526 11/20/17 0455  WBC 5.3 4.5  HGB 11.1* 10.5*  HCT  35.3* 33.2*  PLT 227 232    BMET Recent Labs    11/19/17 1526 11/20/17 0455  NA 138 137  K 3.6 3.8  CL 95* 97*  CO2 30 27  GLUCOSE 94 84  BUN 20 30*  CREATININE 4.42* 5.66*  CALCIUM 8.4* 8.1*    MICRO Recent Results (from the past 240 hour(s))  Culture, blood (Routine x 2)     Status: None (Preliminary result)   Collection Time: 11/19/17  3:28 PM  Result Value Ref Range Status   Specimen Description BLOOD LEFT ANTECUBITAL  Final   Special Requests   Final    BOTTLES DRAWN AEROBIC AND ANAEROBIC Blood Culture adequate volume   Culture   Final    NO GROWTH < 24 HOURS Performed at [facility], [address] [place], [place] [address]    Report Status PENDING  Incomplete  Culture, blood (Routine x 2)     Status: None (Preliminary result)   Collection Time: 11/19/17  3:28 PM  Result Value Ref Range Status   Specimen Description BLOOD LEFT WRIST  Final   Special Requests   Final    BOTTLES DRAWN AEROBIC AND ANAEROBIC Blood Culture adequate volume   Culture  Setup Time   Final    GRAM POSITIVE COCCI AEROBIC AND ANAEROBIC BOTTLES Gram Stain Report Called to,Read Back By and Verified With: BIVENS L. AT [id number] ON [id number] BY THOMPSON S. Performed at [facility], [address] [place], [place] [address]    Culture PENDING  Incomplete   Report Status PENDING  Incomplete      Lab Results  Component Value Date   CALCIUM 8.1 (L) 11/20/2017   Albumin  3.6 Corrected calcium  8.1+ 0.3=8.4     Impression: 1)Renal ESRD on HD               On Tuesday/Thursday/Saturday schedule                Pt had 4 hours tx yesterday  2)HTN Medication- On RAS blockers On Diuretics. On Calcium Channel Blockers On Beta blockers On Vasodilators  3)Anemia n ESRD the goal for HGb is 9--11.  4)CKD Mineral-Bone Disorder Calcium at goal, when corrected for low albumin  5)ID-admitted with sepsis Blood cultures positive for Gram positive  on Broad spectrum Abx  Primary MD  following  6)Electrolytes Normokalemic NOrmonatremic   7)Acid base Co2 at goal     Plan:  Agree with current tx and plan NO need of HD today Will dialyze in am Will keep on epo Will  use 2k/2.5 [place] bath  I did have extensive discussion with pt and his family about his current issues.      Christyna Letendre S 11/20/2017, 2:56 PM

## 2017-11-20 NOTE — Significant Event (Signed)
Notified by nursing that patient did not have any vancomycin ordered after initial bolus yesterday for loading dose.  Patient noted to be growing MRSA and blood cultures.  Repeat blood cultures ordered and TTE ordered.  Ordered 1000 mg after every hemodialysis session, patient gets hemodialysis Tuesday/Thursday/Saturday.

## 2017-11-20 NOTE — Progress Notes (Signed)
CRITICAL VALUE ALERT  Critical Value:  Blood culture showing Staph species and methicillin resistant bacteria in blood culture bottle  Date & Time Notied:  11/20/17 @ [place]  Provider Notified: Dr. Judieth Keens  Orders Received/Actions taken: currently no new orders.

## 2017-11-20 NOTE — Progress Notes (Signed)
PROGRESS NOTE    Bryce Miller  JQG:[id number] DOB: 1960/10/31 DOA: 11/19/2017 PCP: Rosita Fire, MD     Brief Narrative:  57 year old man admitted to the hospital on 4/16 after he had a fever of 102.5 during dialysis.  He was placed on broad-spectrum antibiotics and blood cultures were drawn.  As of today 1 out of 2 blood cultures are growing gram-positive cocci.  Admission was requested for further evaluation and management.   Assessment & Plan:   Principal Problem:   Fever Active Problems:   ESRD (end stage renal disease) ([facility])   Paraplegic spinal paralysis (HCC)   Sepsis (HCC)   Fever/gram-positive bacteremia -For now, will elect to treat as true bacteremia even though only 1 out of 2 cultures are positive. -Continue vancomycin, can discontinue cefepime. -Patient has been afebrile since admission. -Await finalization of blood cultures to determine further antibiotic needs.  End-stage renal disease -Nephrology has been consulted for hemodialysis, is due for dialysis on 4/18.  Diet-controlled diabetes -Well-controlled, continue to monitor. -Check A1c  Hypertension -Well-controlled, continue enalapril, metoprolol, hydralazine, Lasix, amlodipine.   DVT prophylaxis: Subcutaneous heparin Code Status: Full code Family Communication: Sister at bedside updated on plan of care and all questions answered Disposition Plan: Anticipate discharge home pending finalization of blood cultures and antibiotic treatment plan  Consultants:   None  Procedures:   None  Antimicrobials:  Anti-infectives (From admission, onward)   Start     Dose/Rate Route Frequency Ordered Stop   11/19/17 1645  vancomycin (VANCOCIN) IVPB 1000 mg/200 mL premix  Status:  Discontinued     1,000 mg 200 mL/hr over 60 Minutes Intravenous  Once 11/19/17 1634 11/19/17 1634   11/19/17 1645  ceFEPIme (MAXIPIME) 2 g in sodium chloride 0.9 % 100 mL IVPB  Status:  Discontinued     2 g 200 mL/hr over 30  Minutes Intravenous  Once 11/19/17 1634 11/19/17 1634   11/19/17 1530  vancomycin (VANCOCIN) 2,000 mg in sodium chloride 0.9 % 500 mL IVPB     2,000 mg 250 mL/hr over 120 Minutes Intravenous  Once 11/19/17 1506 11/19/17 1954   11/19/17 1515  vancomycin (VANCOCIN) IVPB 1000 mg/200 mL premix  Status:  Discontinued     1,000 mg 200 mL/hr over 60 Minutes Intravenous  Once 11/19/17 1504 11/19/17 1506   11/19/17 1515  ceFEPIme (MAXIPIME) 2 g in sodium chloride 0.9 % 100 mL IVPB     2 g 200 mL/hr over 30 Minutes Intravenous  Once 11/19/17 1504 11/19/17 1620       Subjective: Lying in bed, disappointed that he will not be discharged home today.  Has no acute complaints, no overnight events.  Objective: Vitals:   11/20/17 0621 11/20/17 1253 11/20/17 1256 11/20/17 1629  BP: (!) 114/91 108/83  124/90  Pulse: 85 81  78  Resp: 16 16    Temp: 100.1 F (37.8 C)  98.7 F (37.1 C)   TempSrc: Oral  Oral   SpO2: 100% 100%    Weight:      Height:        Intake/Output Summary (Last 24 hours) at 11/20/2017 1742 Last data filed at 11/20/2017 1200 Gross per 24 hour  Intake 980 ml  Output -  Net 980 ml   Filed Weights   11/19/17 1439 11/19/17 2018  Weight: 102.1 kg (225 lb) 92.6 kg (204 lb 2.3 oz)    Examination:  General exam: Alert, awake, oriented x 3 Respiratory system: Clear to auscultation. Respiratory effort normal.  Cardiovascular system:RRR. No murmurs, rubs, gallops. Gastrointestinal system: Abdomen is nondistended, soft and nontender. No organomegaly or masses felt. Normal bowel sounds heard. Central nervous system: Alert and oriented.  Patient is paraplegic at baseline. Extremities: No C/C/E, +pedal pulses Skin: No rashes, lesions or ulcers Psychiatry: Judgement and insight appear normal. Mood & affect appropriate.     Data Reviewed: I have personally reviewed following labs and imaging studies  CBC: Recent Labs  Lab 11/19/17 1526 11/20/17 0455  WBC 5.3 4.5  NEUTROABS  3.8  --   HGB 11.1* 10.5*  HCT 35.3* 33.2*  MCV 92.4 91.2  PLT 227 299   Basic Metabolic Panel: Recent Labs  Lab 11/19/17 1526 11/20/17 0455  NA 138 137  K 3.6 3.8  CL 95* 97*  CO2 30 27  GLUCOSE 94 84  BUN 20 30*  CREATININE 4.42* 5.66*  CALCIUM 8.4* 8.1*   GFR: Estimated Creatinine Clearance: 16.9 mL/min (A) (by C-G formula based on SCr of 5.66 mg/dL (H)). Liver Function Tests: Recent Labs  Lab 11/19/17 1526  AST 15  ALT 12*  ALKPHOS 55  BILITOT 0.6  PROT 7.9  ALBUMIN 3.6   No results for input(s): LIPASE, AMYLASE in the last 168 hours. No results for input(s): AMMONIA in the last 168 hours. Coagulation Profile: Recent Labs  Lab 11/19/17 1526  INR 1.09   Cardiac Enzymes: No results for input(s): CKTOTAL, CKMB, CKMBINDEX, TROPONINI in the last 168 hours. BNP (last 3 results) No results for input(s): PROBNP in the last 8760 hours. HbA1C: No results for input(s): HGBA1C in the last 72 hours. CBG: Recent Labs  Lab 11/20/17 0743  GLUCAP 85   Lipid Profile: No results for input(s): CHOL, HDL, LDLCALC, TRIG, CHOLHDL, LDLDIRECT in the last 72 hours. Thyroid Function Tests: No results for input(s): TSH, T4TOTAL, FREET4, T3FREE, THYROIDAB in the last 72 hours. Anemia Panel: No results for input(s): VITAMINB12, FOLATE, FERRITIN, TIBC, IRON, RETICCTPCT in the last 72 hours. Urine analysis:    Component Value Date/Time   COLORURINE YELLOW 11/19/2017 1703   APPEARANCEUR CLEAR 11/19/2017 1703   LABSPEC 1.014 11/19/2017 1703   PHURINE 7.0 11/19/2017 1703   GLUCOSEU NEGATIVE 11/19/2017 1703   HGBUR SMALL (A) 11/19/2017 1703   BILIRUBINUR NEGATIVE 11/19/2017 1703   KETONESUR NEGATIVE 11/19/2017 1703   PROTEINUR 100 (A) 11/19/2017 1703   NITRITE NEGATIVE 11/19/2017 1703   LEUKOCYTESUR NEGATIVE 11/19/2017 1703   Sepsis Labs: @LABRCNTIP (procalcitonin:4,lacticidven:4)  ) Recent Results (from the past 240 hour(s))  Culture, blood (Routine x 2)     Status:  None (Preliminary result)   Collection Time: 11/19/17  3:28 PM  Result Value Ref Range Status   Specimen Description BLOOD LEFT ANTECUBITAL  Final   Special Requests   Final    BOTTLES DRAWN AEROBIC AND ANAEROBIC Blood Culture adequate volume   Culture   Final    NO GROWTH < 24 HOURS Performed at [facility], [address], [place], [place] [address]    Report Status PENDING  Incomplete  Culture, blood (Routine x 2)     Status: None (Preliminary result)   Collection Time: 11/19/17  3:28 PM  Result Value Ref Range Status   Specimen Description   Final    BLOOD LEFT WRIST Performed at [facility], [address], [place], [place] [address]    Special Requests   Final    BOTTLES DRAWN AEROBIC AND ANAEROBIC Blood Culture adequate volume Performed at [facility], [address], [place], [place]  [address]    Culture  Setup Time   Final    GRAM POSITIVE COCCI IN BOTH AEROBIC AND ANAEROBIC BOTTLES Gram Stain Report Called to,Read Back By and Verified With: BIVENS L. AT [id number] ON [id number] BY THOMPSON S. Organism ID to follow Performed at [facility], [place] [address] [place], [place] [address]    Culture PENDING  Incomplete   Report Status PENDING  Incomplete         Radiology Studies: Dg Chest Portable 1 View  Result Date: 11/19/2017 CLINICAL DATA:  fever EXAM: PORTABLE CHEST 1 VIEW COMPARISON:  None. FINDINGS: The heart is mildly enlarged. Low lung volumes. Vascular congestion. Lungs are grossly clear. No pneumothorax or pleural effusion. IMPRESSION: Mild cardiomegaly and vascular congestion. Electronically Signed   By: Marybelle Killings M.D.   On: 11/19/2017 15:18        Scheduled Meds: . amLODipine  5 mg Oral Daily  . atorvastatin  10 mg Oral Daily  . cholecalciferol  1,000 Units Oral Daily  . enalapril  10 mg Oral Daily  . [START ON 11/21/2017] epoetin (EPOGEN/PROCRIT) injection  2,000 Units Subcutaneous Q T,Th,Sa-HD  . FLUoxetine  10 mg Oral Daily  . furosemide  40 mg  Oral Daily  . heparin  5,000 Units Subcutaneous Q8H  . hydrALAZINE  25 mg Oral TID  . latanoprost  1 drop Both Eyes QHS  . metoprolol tartrate  50 mg Oral BID  . pantoprazole  40 mg Oral Daily  . [START ON 11/21/2017] polyethylene glycol  17 g Oral Q Thu  . sodium chloride flush  3 mL Intravenous Q12H   Continuous Infusions: . sodium chloride       LOS: 1 day    Time spent: 35 minutes. Greater than 50% of this time was spent in direct contact with the patient, coordinating care and discussing relevant ongoing clinical issues with patient and patient's sister at bedside, including reasons as to why he cannot be discharged home today which are mainly his gram-positive bacteremia, have discussed that we need to finalize blood cultures that will help [place] determine what further workup and antibiotics he may or may not need.  Also explained that because of his end-stage renal disease state, he is at higher risk for resistant pathogens so blood cultures are mandatory to determine plan of care.     Lelon Frohlich, MD [facility] Pager [phone]  If 7PM-7AM, please contact night-coverage [email or web address] Password [facility] 11/20/2017, 5:42 PM

## 2017-11-20 NOTE — Progress Notes (Signed)
    HEMODIALYSIS NURSING NOTE:   Dr. Theador Hawthorne was notified of new consult order for HD.  Confirmation received from Nira Conn, RN at [facility] outpatient clinic that pt received full 4h HD treatment yesterday.  Pt presented with low-grade fever which continued to increase throughout treatment.   Blood cultures were collected but no antibiotics were given.  Rockwell Alexandria, RN, CDN

## 2017-11-21 LAB — GLUCOSE, CAPILLARY: Glucose-Capillary: 92 mg/dL (ref 65–99)

## 2017-11-21 LAB — URINE CULTURE: CULTURE: NO GROWTH

## 2017-11-21 LAB — HEMOGLOBIN A1C
Hgb A1c MFr Bld: 4.4 % — ABNORMAL LOW (ref 4.8–5.6)
Mean Plasma Glucose: 79.58 mg/dL

## 2017-11-21 MED ORDER — SODIUM CHLORIDE 0.9 % IV SOLN: 100.0000 mL | INTRAVENOUS | Status: DC | PRN

## 2017-11-21 MED ORDER — SODIUM CHLORIDE 0.9 % IV SOLN
2.0000 g | INTRAVENOUS | Status: DC
  Administered 2017-11-21: 2 g via INTRAVENOUS
  Filled 2017-11-21: qty 2

## 2017-11-21 MED ORDER — EPOETIN ALFA 2000 UNIT/ML IJ SOLN
INTRAMUSCULAR | Status: AC
  Administered 2017-11-21: 2000 [IU] via SUBCUTANEOUS
  Filled 2017-11-21: qty 1

## 2017-11-21 MED ORDER — PENTAFLUOROPROP-TETRAFLUOROETH EX AERO: 1.0000 "application " | INHALATION_SPRAY | CUTANEOUS | Status: DC | PRN

## 2017-11-21 MED ORDER — VANCOMYCIN HCL 500 MG IV SOLR
500.0000 mg | Freq: Once | INTRAVENOUS | Status: AC
  Administered 2017-11-21: 500 mg via INTRAVENOUS
  Filled 2017-11-21: qty 500

## 2017-11-21 MED ORDER — VANCOMYCIN HCL IN DEXTROSE 1-5 GM/200ML-% IV SOLN: 1000.0000 mg | INTRAVENOUS | Status: AC

## 2017-11-21 MED ORDER — LIDOCAINE HCL (PF) 1 % IJ SOLN: 5.0000 mL | INTRAMUSCULAR | Status: DC | PRN

## 2017-11-21 MED ORDER — HEPARIN SODIUM (PORCINE) 1000 UNIT/ML DIALYSIS
20.0000 [IU]/kg | INTRAMUSCULAR | Status: DC | PRN
  Filled 2017-11-21: qty 2

## 2017-11-21 MED ORDER — LIDOCAINE-PRILOCAINE 2.5-2.5 % EX CREA
1.0000 | TOPICAL_CREAM | CUTANEOUS | Status: DC | PRN
Start: 2017-11-21 — End: 2017-11-21

## 2017-11-21 NOTE — Procedures (Signed)
    HEMODIALYSIS TREATMENT NOTE:   4 hour heparin-free dialysis completed via right upper arm AVF (15g/antegrade). Goal met: 2 liters removed without interruption in ultrafiltration.  All blood was returned and hemostasis was achieved in 15 minutes.  Vancomycin and Maxipime given at end of HD.   Report given to Threasa Alpha, RN.   Rockwell Alexandria, RN CDN

## 2017-11-21 NOTE — Progress Notes (Signed)
Patient discharged home with personal belongings.  IVs removed and sites intact. Patient discharged home, family aware he will get IV Vancomycin during dialysis.

## 2017-11-21 NOTE — Progress Notes (Addendum)
Pharmacy Antibiotic Note  Bryce Miller is a 57 y.o. male admitted on 11/19/2017 with sepsis.  Pharmacy has been consulted for Vancomycin and cefepime dosing.  Addendum:  Initial 1gm dose was given prior to dialysis, some peripheral infiltration noted.  D/W dialysis nurse, will give additional 500mg  dose of Vancomycin after dialysis via central access.    Plan: Vancomycin 1000mg  after every HD session Cefepime 2gm IV after every HD session F/u cxs and clinical progress Monitor V/S and labs, levels as indicated  Height: 5\' 10"  (177.8 cm) Weight: 203 lb 0.7 oz (92.1 kg) IBW/kg (Calculated) : 73  Temp (24hrs), Avg:98.6 F (37 C), Min:98.4 F (36.9 C), Max:98.7 F (37.1 C)  Recent Labs  Lab 11/19/17 1526 11/19/17 1538 11/19/17 1712 11/20/17 0455  WBC 5.3  --   --  4.5  CREATININE 4.42*  --   --  5.66*  LATICACIDVEN  --  2.51* 0.82 0.6    Estimated Creatinine Clearance: 16.8 mL/min (A) (by C-G formula based on SCr of 5.66 mg/dL (H)).    No Known Allergies  Antimicrobials this admission: Vancomycin 4/16 >>  Cefepime 4/16>>  Dose adjustments this admission: NA  Microbiology results: 4/16 BCx: pending 4/16 UCx: pending  Thank you for allowing pharmacy to be a part of this patient's care.  Hart Robinsons, PharmD Clinical Pharmacist Pager:  [phone] 11/21/2017  11/21/2017 10:55 AM

## 2017-11-21 NOTE — Progress Notes (Signed)
Subjective: Interval History: has no complaint of fever, chills or sweating.  Patient also denies any difficulty breathing.  No cough..  Objective: Vital signs in last 24 hours: Temp:  [98.4 F (36.9 C)-98.7 F (37.1 C)] 98.4 F (36.9 C) (04/18 0458) Pulse Rate:  [73-81] 73 (04/18 0458) Resp:  [16] 16 (04/17 1253) BP: (107-124)/(72-90) 107/72 (04/18 0458) SpO2:  [96 %-100 %] 100 % (04/18 0458) Weight:  [92.1 kg (203 lb 0.7 oz)] 92.1 kg (203 lb 0.7 oz) (04/18 0458) Weight change: -9.959 kg (-21 lb 15.3 oz)  Intake/Output from previous day: 04/17 0701 - 04/18 0700 In: 603 [P.O.:600; I.V.:3] Out: 150 [Urine:150] Intake/Output this shift: No intake/output data recorded.  General appearance: alert, cooperative and no distress Resp: clear to auscultation bilaterally Cardio: regular rate and rhythm Extremities: No edema  Lab Results: Recent Labs    11/19/17 1526 11/20/17 0455  WBC 5.3 4.5  HGB 11.1* 10.5*  HCT 35.3* 33.2*  PLT 227 232   BMET:  Recent Labs    11/19/17 1526 11/20/17 0455  NA 138 137  K 3.6 3.8  CL 95* 97*  CO2 30 27  GLUCOSE 94 84  BUN 20 30*  CREATININE 4.42* 5.66*  CALCIUM 8.4* 8.1*   No results for input(s): PTH in the last 72 hours. Iron Studies: No results for input(s): IRON, TIBC, TRANSFERRIN, FERRITIN in the last 72 hours.  Studies/Results: Dg Chest Portable 1 View  Result Date: 11/19/2017 CLINICAL DATA:  fever EXAM: PORTABLE CHEST 1 VIEW COMPARISON:  None. FINDINGS: The heart is mildly enlarged. Low lung volumes. Vascular congestion. Lungs are grossly clear. No pneumothorax or pleural effusion. IMPRESSION: Mild cardiomegaly and vascular congestion. Electronically Signed   By: Marybelle Killings M.D.   On: 11/19/2017 15:18    I have reviewed the patient's current medications.  Assessment/Plan: 1] fever: Blood culture positive for MRSA.  Presently he is afebrile and white blood cell count is normal.  Patient is on antibiotics.  Source of  infection is not clear.  To] end-stage renal disease: He is status post hemodialysis on Tuesday.  Potassium is normal and patient is due for dialysis today 3] hypertension: His blood pressure is reasonably controlled For] diabetes 5] bone and mineral disorder: His calcium is in range 6] anemia: His hemoglobin is always on target goal 7] history of CVA: Patient is paraplegic 8] fluid management patient does not have significant sign of fluid overload Plan: We will dialyze patient today We will remove 2 L if systolic blood pressure remains above 90. We will check a CBC and renal panel in the morning   LOS: 2 days   Devona Holmes S 11/21/2017,8:52 AM

## 2017-11-21 NOTE — Discharge Summary (Signed)
Physician Discharge Summary  [name] ACZ:[id number] DOB: [birth year] DOA: 11/19/2017  PCP: Rosita Fire, MD  Admit date: 11/19/2017 Discharge date: 11/21/2017  Time spent: 45 minutes  Recommendations for Outpatient Follow-up:  -Will be discharged home today after hemodialysis session is complete. -Will receive vancomycin 1 g after each dialysis session for a total of 7 days, to complete treatment on 11/28/2017. -Will need blood cultures approximately 2-3 days after last dose of vancomycin is given to document clearance.  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 Manuella Ghazi on 4/16: Bryce Miller is a 57 y.o. 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  He developed this temperature after completing hemodialysis.  He is a poor historian with limited ability to speak, but appears to deny any symptomatic complaints or concerns.   ED Course: Vital signs in the ED are noted to be stable with temperature 102.2 Fahrenheit.  Laboratory data with no leukocytosis, but lactic acid is 2.51.  Other labs are within normal limits.  Chest x-ray with mild cardiomegaly and vascular congestion but no infiltrate.  Urine has not been collected.  Blood cultures have been collected x2 sets with initiation of vancomycin and cefepime    Hospital Course:   Early sepsis due to coag negative staph bacteremia -Sepsis parameters have  resolved. -1 out of 4 blood cultures are positive for gram-positive cocci, BC ID shows coag negative staph with methicillin resistance. -Case has been discussed via phone with infectious disease physician, Dr. Baxter Flattery.  We cannot entirely say that this is a contaminant as he was febrile. -No source of infection is visible at this time. -We have decided to treat with 7 days of vancomycin.  Will need blood cultures approximately 2-3 days after finishing last dose of vancomycin to document clearance of coag negative staph bacteremia.  End-stage renal disease -Appreciate nephrology input and recommendations in regards to hemodialysis.  Hypertension -Has been well controlled, continue home medications.  Diet-controlled diabetes -With good control while hospitalized.  Procedures:  Hemodialysis  Consultations:  Nephrology  Curbside consultation with Dr. Baxter Flattery, ID  Discharge Instructions  Discharge Instructions    Diet - low sodium heart healthy   Complete by:  As directed    Increase activity slowly   Complete by:  As directed      Allergies as of 11/21/2017   No Known Allergies     Medication List    TAKE these medications   amLODipine 5 MG tablet Commonly known as:  NORVASC Take 5 mg by mouth daily.   calcitRIOL 0.25 MCG capsule Commonly known as:  ROCALTROL Take 0.25 mcg by mouth daily.   cholecalciferol 1000 units tablet Commonly known as:  VITAMIN D Take 1,000 Units by mouth daily.   enalapril 10 MG tablet Commonly known as:  VASOTEC Take 10 mg by mouth daily.   furosemide 40 MG tablet Commonly known as:  LASIX Take 40  mg by mouth daily.   hydrALAZINE 50 MG tablet Commonly known as:  APRESOLINE Take 50 mg by mouth 3 (three) times daily.   metoprolol tartrate 50 MG tablet Commonly known as:  LOPRESSOR Take 25 mg by mouth daily.   omeprazole 20 MG capsule Commonly known as:  PRILOSEC Take 20 mg by mouth daily.   polyethylene glycol packet Commonly  known as:  MIRALAX / GLYCOLAX Take 17 g by mouth every Thursday.   sodium bicarbonate 650 MG tablet Take 1,300 mg by mouth 2 (two) times daily.   vancomycin 1-5 GM/200ML-% Soln Commonly known as:  VANCOCIN Inject 200 mLs (1,000 mg total) into the vein Every Tuesday,Thursday,and Saturday with dialysis for 5 days. Give 1 g after each dialysis session, to complete on 4/25. Start taking on:  11/23/2017      No Known Allergies    The results of significant diagnostics from this hospitalization (including imaging, microbiology, ancillary and laboratory) are listed below for reference.    Significant Diagnostic Studies: Dg Chest Portable 1 View  Result Date: 11/19/2017 CLINICAL DATA:  fever EXAM: PORTABLE CHEST 1 VIEW COMPARISON:  None. FINDINGS: The heart is mildly enlarged. Low lung volumes. Vascular congestion. Lungs are grossly clear. No pneumothorax or pleural effusion. IMPRESSION: Mild cardiomegaly and vascular congestion. Electronically Signed   By: Marybelle Killings M.D.   On: 11/19/2017 15:18    Microbiology: Recent Results (from the past 240 hour(s))  Culture, blood (Routine x 2)     Status: None (Preliminary result)   Collection Time: 11/19/17  3:28 PM  Result Value Ref Range Status   Specimen Description BLOOD LEFT ANTECUBITAL  Final   Special Requests   Final    BOTTLES DRAWN AEROBIC AND ANAEROBIC Blood Culture adequate volume   Culture   Final    NO GROWTH 2 DAYS Performed at [facility], [address] [place], [place] [address]    Report Status PENDING  Incomplete  Culture, blood (Routine x 2)     Status: Abnormal (Preliminary result)   Collection Time: 11/19/17  3:28 PM  Result Value Ref Range Status   Specimen Description   Final    BLOOD LEFT WRIST Performed at [facility], [address], [place], [place] [address]    Special Requests   Final    BOTTLES DRAWN AEROBIC AND ANAEROBIC Blood Culture adequate volume Performed at [facility], [address],  [place], [place] [address]    Culture  Setup Time   Final    GRAM POSITIVE COCCI IN BOTH AEROBIC AND ANAEROBIC BOTTLES Gram Stain Report Called to,Read Back By and Verified With: BIVENS L. AT [id number] ON [id number] BY THOMPSON S. Organism ID to follow CRITICAL RESULT CALLED TO, READ BACK BY AND VERIFIED WITH: L BIVENS RN [id number] 11/20/17 A BROWNING    Culture (A)  Final    STAPHYLOCOCCUS SPECIES (COAGULASE NEGATIVE) THE SIGNIFICANCE OF ISOLATING THIS ORGANISM FROM A SINGLE SET OF BLOOD CULTURES WHEN MULTIPLE SETS ARE DRAWN IS UNCERTAIN. PLEASE NOTIFY THE MICROBIOLOGY DEPARTMENT WITHIN ONE WEEK IF SPECIATION AND SENSITIVITIES ARE REQUIRED. Performed at [facility], [place] [address] [place],  [address]    Report Status PENDING  Incomplete  Blood Culture ID Panel (Reflexed)     Status: Abnormal   Collection Time: 11/19/17  3:28 PM  Result Value Ref Range Status   Enterococcus species NOT DETECTED NOT DETECTED Final   Listeria monocytogenes NOT DETECTED NOT DETECTED Final   Staphylococcus species DETECTED (A)  NOT DETECTED Final    Comment: Methicillin (oxacillin) resistant coagulase negative staphylococcus. Possible blood culture contaminant (unless isolated from more than one blood culture draw or clinical case suggests pathogenicity). No antibiotic treatment is indicated for blood  culture contaminants. CRITICAL RESULT CALLED TO, READ BACK BY AND VERIFIED WITH: L BIVENS RN [id number] 11/20/17 A BROWNING    Staphylococcus aureus NOT DETECTED NOT DETECTED Final   Methicillin resistance DETECTED (A) NOT DETECTED Final    Comment: CRITICAL RESULT CALLED TO, READ BACK BY AND VERIFIED WITH: L BIVENS RN [id number] 11/20/17 A BROWNING    Streptococcus species NOT DETECTED NOT DETECTED Final   Streptococcus agalactiae NOT DETECTED NOT DETECTED Final   Streptococcus pneumoniae NOT DETECTED NOT DETECTED Final   Streptococcus pyogenes NOT DETECTED NOT DETECTED Final   Acinetobacter baumannii NOT DETECTED NOT  DETECTED Final   Enterobacteriaceae species NOT DETECTED NOT DETECTED Final   Enterobacter cloacae complex NOT DETECTED NOT DETECTED Final   Escherichia coli NOT DETECTED NOT DETECTED Final   Klebsiella oxytoca NOT DETECTED NOT DETECTED Final   Klebsiella pneumoniae NOT DETECTED NOT DETECTED Final   Proteus species NOT DETECTED NOT DETECTED Final   Serratia marcescens NOT DETECTED NOT DETECTED Final   Haemophilus influenzae NOT DETECTED NOT DETECTED Final   Neisseria meningitidis NOT DETECTED NOT DETECTED Final   Pseudomonas aeruginosa NOT DETECTED NOT DETECTED Final   Candida albicans NOT DETECTED NOT DETECTED Final   Candida glabrata NOT DETECTED NOT DETECTED Final   Candida krusei NOT DETECTED NOT DETECTED Final   Candida parapsilosis NOT DETECTED NOT DETECTED Final   Candida tropicalis NOT DETECTED NOT DETECTED Final    Comment: Performed at [facility], [place]. [address] [place], [place] [address]  Urine culture     Status: None   Collection Time: 11/19/17  5:03 PM  Result Value Ref Range Status   Specimen Description   Final    URINE, RANDOM Performed at [facility], [address] [place], [place] [address]    Special Requests   Final    NONE Performed at [facility], [address], [place], [place] [address]    Culture   Final    NO GROWTH Performed at [facility], [place] [address] [place], [place] [address]    Report Status 11/21/2017 FINAL  Final  Culture, blood (routine x 2)     Status: None (Preliminary result)   Collection Time: 11/20/17  8:05 PM  Result Value Ref Range Status   Specimen Description BLOOD LEFT HAND  Final   Special Requests   Final    BOTTLES DRAWN AEROBIC AND ANAEROBIC Blood Culture adequate volume   Culture   Final    NO GROWTH < 12 HOURS Performed at [facility], [address], [place], [place] [address]    Report Status PENDING  Incomplete  Culture, blood (routine x 2)     Status: None (Preliminary result)   Collection Time:  11/20/17  8:07 PM  Result Value Ref Range Status   Specimen Description BLOOD LEFT HAND  Final   Special Requests   Final    BOTTLES DRAWN AEROBIC ONLY Blood Culture adequate volume   Culture   Final    NO GROWTH < 12 HOURS Performed at [facility], [address] [place], [place] [address]    Report Status PENDING  Incomplete     Labs: Basic Metabolic Panel: Recent Labs  Lab 11/19/17 1526 11/20/17 0455  NA 138 137  K 3.6 3.8  CL 95* 97*  CO2 30 27  GLUCOSE 94 84  BUN 20 30*  CREATININE 4.42* 5.66*  CALCIUM 8.4* 8.1*   Liver Function Tests: Recent Labs  Lab 11/19/17 1526  AST 15  ALT 12*  ALKPHOS 55  BILITOT 0.6  PROT 7.9  ALBUMIN 3.6   No results for input(s): LIPASE, AMYLASE in the last 168 hours. No results for input(s): AMMONIA in the last 168 hours. CBC: Recent Labs  Lab 11/19/17 1526 11/20/17 0455  WBC 5.3 4.5  NEUTROABS 3.8  --   HGB 11.1* 10.5*  HCT 35.3* 33.2*  MCV 92.4 91.2  PLT 227 232   Cardiac Enzymes: No results for input(s): CKTOTAL, CKMB, CKMBINDEX, TROPONINI in the last 168 hours. BNP: BNP (last 3 results) No results for input(s): BNP in the last 8760 hours.  ProBNP (last 3 results) No results for input(s): PROBNP in the last 8760 hours.  CBG: Recent Labs  Lab 11/20/17 0743 11/21/17 0710  GLUCAP 85 92       Signed:  [facility] Pager: [phone] 11/21/2017, 4:32 PM

## 2017-11-22 LAB — CULTURE, BLOOD (ROUTINE X 2): Special Requests: ADEQUATE

## 2017-11-24 LAB — CULTURE, BLOOD (ROUTINE X 2)
CULTURE: NO GROWTH
SPECIAL REQUESTS: ADEQUATE

## 2017-11-25 LAB — CULTURE, BLOOD (ROUTINE X 2)
Culture: NO GROWTH
Culture: NO GROWTH
Special Requests: ADEQUATE
Special Requests: ADEQUATE

## 2018-03-10 ENCOUNTER — Other Ambulatory Visit: Payer: Self-pay | Admitting: *Deleted

## 2018-03-10 ENCOUNTER — Other Ambulatory Visit: Payer: Self-pay

## 2018-03-10 ENCOUNTER — Encounter: Payer: Self-pay | Admitting: Surgery

## 2018-03-10 ENCOUNTER — Encounter: Payer: Self-pay | Admitting: *Deleted

## 2018-03-10 ENCOUNTER — Ambulatory Visit (INDEPENDENT_AMBULATORY_CARE_PROVIDER_SITE_OTHER): Payer: Medicaid Other | Admitting: Surgery

## 2018-03-10 VITALS — BP 93/52 | HR 81 | Temp 98.5°F | Resp 20 | Ht 70.0 in | Wt 203.0 lb

## 2018-03-10 DIAGNOSIS — N186 End stage renal disease: Secondary | ICD-10-CM

## 2018-03-10 DIAGNOSIS — Z992 Dependence on renal dialysis: Secondary | ICD-10-CM | POA: Diagnosis not present

## 2018-03-10 NOTE — H&P (View-Only) (Signed)
Vascular and Vein Specialist of [facility]  Patient name: Bryce Miller MRN: [id number] DOB: Feb 13, 1961 Sex: male   REASON FOR VISIT:    dialysis  HISOTRY OF PRESENT ILLNESS:    Hilmer Aliberti is a 57 y.o. male who is s/p right BCF by Dr. Bridgett Larsson on 12-06-2015.  He was referred by the dialysis center for concerns of her prolonged bleeding.   PAST MEDICAL HISTORY:   Past Medical History:  Diagnosis Date  . Chronic kidney disease    functioning at 18%  . Constipation   . Diabetes mellitus without complication ([place])    "borderline"  . GERD (gastroesophageal reflux disease)   . Hypertension   . 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 Stroke [facility])    left side weakness     FAMILY HISTORY:   Family History  Problem Relation Age of Onset  . Diabetes Mother     SOCIAL HISTORY:   Social History   Tobacco Use  . Smoking status: Never Smoker  . Smokeless tobacco: Never Used  Substance Use Topics  . Alcohol use: No     ALLERGIES:   No Known Allergies   CURRENT MEDICATIONS:   Current Outpatient Medications  Medication Sig Dispense Refill  . amLODipine (NORVASC) 5 MG tablet Take 5 mg by mouth daily.    . calcitRIOL (ROCALTROL) 0.25 MCG capsule Take 0.25 mcg by mouth daily.    . cholecalciferol (VITAMIN D) 1000 UNITS tablet Take 1,000 Units by mouth daily.    . enalapril (VASOTEC) 10 MG tablet Take 10 mg by mouth daily.    . furosemide (LASIX) 40 MG tablet Take 40 mg by mouth daily.     . hydrALAZINE (APRESOLINE) 50 MG tablet Take 50 mg by mouth 3 (three) times daily.     . metoprolol (LOPRESSOR) 50 MG tablet Take 25 mg by mouth daily.     Marland Kitchen omeprazole (PRILOSEC) 20 MG capsule Take 20 mg by mouth daily.    . polyethylene glycol (MIRALAX / GLYCOLAX) packet Take 17 g by mouth every Thursday.     . sodium bicarbonate 650 MG tablet Take 1,300 mg by mouth 2 (two) times daily.     No current facility-administered medications for this  visit.     REVIEW OF SYSTEMS:   [X]  denotes positive finding, [ ]  denotes negative finding Cardiac  Comments:  Chest pain or chest pressure:    Shortness of breath upon exertion:    Short of breath when lying flat:    Irregular heart rhythm:        Vascular    Pain in calf, thigh, or hip brought on by ambulation:    Pain in feet at night that wakes you up from your sleep:     Blood clot in your veins:    Leg swelling:         Pulmonary    Oxygen at home:    Productive cough:     Wheezing:         Neurologic    Sudden weakness in arms or legs:     Sudden numbness in arms or legs:     Sudden onset of difficulty speaking or slurred speech:    Temporary loss of vision in one eye:     Problems with dizziness:         Gastrointestinal    Blood in stool:     Vomited blood:         Genitourinary  Burning when urinating:     Blood in urine:        Psychiatric    Major depression:         Hematologic    Bleeding problems:    Problems with blood clotting too easily:        Skin    Rashes or ulcers:        Constitutional    Fever or chills:      PHYSICAL EXAM:   Vitals:   03/10/18 1159  BP: (!) 93/52  Pulse: 81  Resp: 20  Temp: 98.5 F (36.9 C)  TempSrc: Oral  SpO2: 97%  Weight: 203 lb (92.1 kg)  Height: 5\' 10"  (1.778 m)    GENERAL: The patient is a well-nourished male, in no acute distress. The vital signs are documented above. CARDIAC: There is a regular rate and rhythm.  VASCULAR: palpable thrill in AVF, no ulcers PULMONARY: Non-labored respirations  MUSCULOSKELETAL: There are no major deformities or cyanosis. SKIN: There are no ulcers or rashes noted. PSYCHIATRIC: The patient has a normal affect.  STUDIES:   none  MEDICAL ISSUES:   The patient came over here without any records.  We had to contact the dialysis center to figure out why he was here.  Apparently he is having issues with prolonged bleeding.  There is also concern over a lump on  his fistula.  I think the best course of action is to proceed with a fistulogram to evaluate for stenosis within his fistula or the central venous system which would explain his prolonged bleeding.  I discussed the details of the procedure with the patient and his family and they wish to proceed.  This will be scheduled for a nondialysis day.    Annamarie Major, MD Vascular and Vein Specialists of [facility] [phone] Pager [phone]

## 2018-03-10 NOTE — Progress Notes (Signed)
Vascular and Vein Specialist of [facility]  Patient name: Bryce Miller MRN: [id number] DOB: [birth year] Sex: male   REASON FOR VISIT:    dialysis  HISOTRY OF PRESENT ILLNESS:    Bryce Miller is a 57 y.o. male who is s/p right BCF by Dr. Bridgett Larsson on 12-06-2015.  He was referred by the dialysis center for concerns of her prolonged bleeding.   PAST MEDICAL HISTORY:   Past Medical History:  Diagnosis Date  . Chronic kidney disease    functioning at 18%  . Constipation   . Diabetes mellitus without complication ([place])    "borderline"  . GERD (gastroesophageal reflux disease)   . Hypertension   . 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 Stroke [facility])    left side weakness     FAMILY HISTORY:   Family History  Problem Relation Age of Onset  . Diabetes Mother     SOCIAL HISTORY:   Social History   Tobacco Use  . Smoking status: Never Smoker  . Smokeless tobacco: Never Used  Substance Use Topics  . Alcohol use: No     ALLERGIES:   No Known Allergies   CURRENT MEDICATIONS:   Current Outpatient Medications  Medication Sig Dispense Refill  . amLODipine (NORVASC) 5 MG tablet Take 5 mg by mouth daily.    . calcitRIOL (ROCALTROL) 0.25 MCG capsule Take 0.25 mcg by mouth daily.    . cholecalciferol (VITAMIN D) 1000 UNITS tablet Take 1,000 Units by mouth daily.    . enalapril (VASOTEC) 10 MG tablet Take 10 mg by mouth daily.    . furosemide (LASIX) 40 MG tablet Take 40 mg by mouth daily.     . hydrALAZINE (APRESOLINE) 50 MG tablet Take 50 mg by mouth 3 (three) times daily.     . metoprolol (LOPRESSOR) 50 MG tablet Take 25 mg by mouth daily.     Marland Kitchen omeprazole (PRILOSEC) 20 MG capsule Take 20 mg by mouth daily.    . polyethylene glycol (MIRALAX / GLYCOLAX) packet Take 17 g by mouth every Thursday.     . sodium bicarbonate 650 MG tablet Take 1,300 mg by mouth 2 (two) times daily.     No current facility-administered medications for this  visit.     REVIEW OF SYSTEMS:   [X]  denotes positive finding, [ ]  denotes negative finding Cardiac  Comments:  Chest pain or chest pressure:    Shortness of breath upon exertion:    Short of breath when lying flat:    Irregular heart rhythm:        Vascular    Pain in calf, thigh, or hip brought on by ambulation:    Pain in feet at night that wakes you up from your sleep:     Blood clot in your veins:    Leg swelling:         Pulmonary    Oxygen at home:    Productive cough:     Wheezing:         Neurologic    Sudden weakness in arms or legs:     Sudden numbness in arms or legs:     Sudden onset of difficulty speaking or slurred speech:    Temporary loss of vision in one eye:     Problems with dizziness:         Gastrointestinal    Blood in stool:     Vomited blood:         Genitourinary  Burning when urinating:     Blood in urine:        Psychiatric    Major depression:         Hematologic    Bleeding problems:    Problems with blood clotting too easily:        Skin    Rashes or ulcers:        Constitutional    Fever or chills:      PHYSICAL EXAM:   Vitals:   03/10/18 1159  BP: (!) 93/52  Pulse: 81  Resp: 20  Temp: 98.5 F (36.9 C)  TempSrc: Oral  SpO2: 97%  Weight: 203 lb (92.1 kg)  Height: 5\' 10"  (1.778 m)    GENERAL: The patient is a well-nourished male, in no acute distress. The vital signs are documented above. CARDIAC: There is a regular rate and rhythm.  VASCULAR: palpable thrill in AVF, no ulcers PULMONARY: Non-labored respirations  MUSCULOSKELETAL: There are no major deformities or cyanosis. SKIN: There are no ulcers or rashes noted. PSYCHIATRIC: The patient has a normal affect.  STUDIES:   none  MEDICAL ISSUES:   The patient came over here without any records.  We had to contact the dialysis center to figure out why he was here.  Apparently he is having issues with prolonged bleeding.  There is also concern over a lump on  his fistula.  I think the best course of action is to proceed with a fistulogram to evaluate for stenosis within his fistula or the central venous system which would explain his prolonged bleeding.  I discussed the details of the procedure with the patient and his family and they wish to proceed.  This will be scheduled for a nondialysis day.    Annamarie Major, MD Vascular and Vein Specialists of [facility] [phone] Pager [phone]

## 2018-03-21 ENCOUNTER — Ambulatory Visit (HOSPITAL_COMMUNITY)
Admission: RE | Admit: 2018-03-21 | Discharge: 2018-03-21 | Disposition: A | Discharge status: Home / Self Care | Payer: Medicaid Other | Source: Ambulatory Visit | Attending: Vascular Surgery | Admitting: Vascular Surgery

## 2018-03-21 ENCOUNTER — Encounter (HOSPITAL_COMMUNITY)
Admission: RE | Disposition: A | Discharge status: Home / Self Care | Payer: Self-pay | Source: Ambulatory Visit | Attending: Vascular Surgery

## 2018-03-21 ENCOUNTER — Other Ambulatory Visit: Payer: Self-pay

## 2018-03-21 DIAGNOSIS — I69254 Hemiplegia and hemiparesis following other nontraumatic intracranial hemorrhage affecting left non-dominant side: Secondary | ICD-10-CM | POA: Diagnosis not present

## 2018-03-21 DIAGNOSIS — I120 Hypertensive chronic kidney disease with stage 5 chronic kidney disease or end stage renal disease: Secondary | ICD-10-CM | POA: Diagnosis not present

## 2018-03-21 DIAGNOSIS — K219 Gastro-esophageal reflux disease without esophagitis: Secondary | ICD-10-CM | POA: Insufficient documentation

## 2018-03-21 DIAGNOSIS — E1122 Type 2 diabetes mellitus with diabetic chronic kidney disease: Secondary | ICD-10-CM | POA: Insufficient documentation

## 2018-03-21 DIAGNOSIS — G8220 Paraplegia, unspecified: Secondary | ICD-10-CM | POA: Diagnosis not present

## 2018-03-21 DIAGNOSIS — T82858A Stenosis of vascular prosthetic devices, implants and grafts, initial encounter: Secondary | ICD-10-CM | POA: Diagnosis present

## 2018-03-21 DIAGNOSIS — T82898A Other specified complication of vascular prosthetic devices, implants and grafts, initial encounter: Secondary | ICD-10-CM

## 2018-03-21 DIAGNOSIS — N186 End stage renal disease: Secondary | ICD-10-CM | POA: Insufficient documentation

## 2018-03-21 DIAGNOSIS — Y832 Surgical operation with anastomosis, bypass or graft as the cause of abnormal reaction of the patient, or of later complication, without mention of misadventure at the time of the procedure: Secondary | ICD-10-CM | POA: Diagnosis not present

## 2018-03-21 HISTORY — PX: A/V SHUNTOGRAM: CATH118297

## 2018-03-21 HISTORY — PX: PERIPHERAL VASCULAR BALLOON ANGIOPLASTY: CATH118281

## 2018-03-21 LAB — POCT I-STAT, CHEM 8
BUN: 40 mg/dL — ABNORMAL HIGH (ref 6–20)
CREATININE: 7.9 mg/dL — AB (ref 0.61–1.24)
Calcium, Ion: 1.09 mmol/L — ABNORMAL LOW (ref 1.15–1.40)
Chloride: 96 mmol/L — ABNORMAL LOW (ref 98–111)
Glucose, Bld: 97 mg/dL (ref 70–99)
HEMATOCRIT: 34 % — AB (ref 39.0–52.0)
HEMOGLOBIN: 11.6 g/dL — AB (ref 13.0–17.0)
POTASSIUM: 6.2 mmol/L — AB (ref 3.5–5.1)
SODIUM: 139 mmol/L (ref 135–145)
TCO2: 34 mmol/L — ABNORMAL HIGH (ref 22–32)

## 2018-03-21 LAB — GLUCOSE, CAPILLARY: Glucose-Capillary: 71 mg/dL (ref 70–99)

## 2018-03-21 LAB — POTASSIUM: Potassium: 4.5 mmol/L (ref 3.5–5.1)

## 2018-03-21 SURGERY — A/V SHUNTOGRAM
Anesthesia: LOCAL | Laterality: Right

## 2018-03-21 MED ORDER — HEPARIN SODIUM (PORCINE) 1000 UNIT/ML IJ SOLN
INTRAMUSCULAR | Status: AC
  Filled 2018-03-21: qty 1

## 2018-03-21 MED ORDER — FENTANYL CITRATE (PF) 100 MCG/2ML IJ SOLN
INTRAMUSCULAR | Status: DC | PRN
  Administered 2018-03-21 (×2): 25 ug via INTRAVENOUS

## 2018-03-21 MED ORDER — HEPARIN SODIUM (PORCINE) 1000 UNIT/ML IJ SOLN
INTRAMUSCULAR | Status: DC | PRN
  Administered 2018-03-21: 3000 [IU] via INTRAVENOUS

## 2018-03-21 MED ORDER — SODIUM CHLORIDE 0.9 % IV SOLN: 250.0000 mL | INTRAVENOUS | Status: DC | PRN

## 2018-03-21 MED ORDER — SODIUM CHLORIDE 0.9% FLUSH: 3.0000 mL | Freq: Two times a day (BID) | INTRAVENOUS | Status: DC

## 2018-03-21 MED ORDER — ONDANSETRON HCL 4 MG/2ML IJ SOLN: 4.0000 mg | Freq: Four times a day (QID) | INTRAMUSCULAR | Status: DC | PRN

## 2018-03-21 MED ORDER — HEPARIN (PORCINE) IN NACL 1000-0.9 UT/500ML-% IV SOLN
INTRAVENOUS | Status: DC | PRN
  Administered 2018-03-21: 500 mL

## 2018-03-21 MED ORDER — OXYCODONE HCL 5 MG PO TABS: 5.0000 mg | ORAL_TABLET | ORAL | Status: DC | PRN

## 2018-03-21 MED ORDER — MORPHINE SULFATE (PF) 10 MG/ML IV SOLN: 2.0000 mg | INTRAVENOUS | Status: DC | PRN

## 2018-03-21 MED ORDER — IODIXANOL 320 MG/ML IV SOLN
INTRAVENOUS | Status: DC | PRN
  Administered 2018-03-21: 100 mL via INTRAVENOUS

## 2018-03-21 MED ORDER — SODIUM CHLORIDE 0.9% FLUSH: 3.0000 mL | INTRAVENOUS | Status: DC | PRN

## 2018-03-21 MED ORDER — LIDOCAINE HCL (PF) 1 % IJ SOLN
INTRAMUSCULAR | Status: DC | PRN
  Administered 2018-03-21 (×2): 2 mL

## 2018-03-21 MED ORDER — LIDOCAINE HCL (PF) 1 % IJ SOLN
INTRAMUSCULAR | Status: AC
  Filled 2018-03-21: qty 30

## 2018-03-21 MED ORDER — HEPARIN (PORCINE) IN NACL 1000-0.9 UT/500ML-% IV SOLN
INTRAVENOUS | Status: AC
  Filled 2018-03-21: qty 500

## 2018-03-21 MED ORDER — FENTANYL CITRATE (PF) 100 MCG/2ML IJ SOLN
INTRAMUSCULAR | Status: AC
  Filled 2018-03-21: qty 2

## 2018-03-21 MED ORDER — ACETAMINOPHEN 325 MG PO TABS: 650.0000 mg | ORAL_TABLET | ORAL | Status: DC | PRN

## 2018-03-21 MED ORDER — HYDRALAZINE HCL 20 MG/ML IJ SOLN: 5.0000 mg | INTRAMUSCULAR | Status: DC | PRN

## 2018-03-21 MED ORDER — LABETALOL HCL 5 MG/ML IV SOLN: 10.0000 mg | INTRAVENOUS | Status: DC | PRN

## 2018-03-21 SURGICAL SUPPLY — 24 items
BAG SNAP BAND KOVER 36X36 (MISCELLANEOUS) ×2 IMPLANT
BALLN ATLAS 12X40X75 (BALLOONS) ×2
BALLN LUTONIX AV 7X60X75 (BALLOONS) ×2
BALLN MUSTANG 10.0X40 75 (BALLOONS) ×2
BALLN MUSTANG 7.0X40 75 (BALLOONS) ×2
BALLOON ATLAS 12X40X75 (BALLOONS) IMPLANT
BALLOON LUTONIX AV 7X60X75 (BALLOONS) IMPLANT
BALLOON MUSTANG 10.0X40 75 (BALLOONS) IMPLANT
BALLOON MUSTANG 7.0X40 75 (BALLOONS) IMPLANT
CATH ANGIO 5F BER2 65CM (CATHETERS) ×1 IMPLANT
COVER DOME SNAP 22 D (MISCELLANEOUS) ×2 IMPLANT
DEVICE TORQUE .025-.038 (MISCELLANEOUS) ×1 IMPLANT
GUIDEWIRE ANGLED .035X150CM (WIRE) ×1 IMPLANT
KIT ENCORE 26 ADVANTAGE (KITS) ×1 IMPLANT
KIT MICROPUNCTURE NIT STIFF (SHEATH) ×1 IMPLANT
PROTECTION STATION PRESSURIZED (MISCELLANEOUS) ×2
SHEATH PINNACLE R/O II 6F 4CM (SHEATH) ×1 IMPLANT
SHEATH PINNACLE R/O II 7F 4CM (SHEATH) ×1 IMPLANT
SHEATH PROBE COVER 6X72 (BAG) ×1 IMPLANT
STATION PROTECTION PRESSURIZED (MISCELLANEOUS) ×1 IMPLANT
STOPCOCK MORSE 400PSI 3WAY (MISCELLANEOUS) ×2 IMPLANT
TRAY PV CATH (CUSTOM PROCEDURE TRAY) ×2 IMPLANT
TUBING CIL FLEX 10 FLL-RA (TUBING) ×2 IMPLANT
WIRE ROSEN-J .035X180CM (WIRE) ×1 IMPLANT

## 2018-03-21 NOTE — Interval H&P Note (Signed)
History and Physical Interval Note:  03/21/2018 11:09 AM  Yates Decamp  has presented today for surgery, with the diagnosis of pvd  The various methods of treatment have been discussed with the patient and family. After consideration of risks, benefits and other options for treatment, the patient has consented to  Procedure(s): A/V SHUNTOGRAM (Right) as a surgical intervention .  The patient's history has been reviewed, patient examined, no change in status, stable for surgery.  I have reviewed the patient's chart and labs.  Questions were answered to the patient's satisfaction.     Ruta Hinds

## 2018-03-21 NOTE — Discharge Instructions (Signed)

## 2018-03-21 NOTE — Progress Notes (Signed)
Repeat K+ 4.5 and telemetry discontinued

## 2018-03-21 NOTE — Progress Notes (Signed)
K+ 6.2 on ISTAT; Dr Oneida Alar notified client placed on telemetry

## 2018-03-21 NOTE — Op Note (Signed)
Procedure: Right arm fistulogram, angioplasty right cephalic arch tandem lesions (7 x 60 Lutonix, 12 x 4 Atlas)  Preoperative diagnosis: Venous hypertension right arm AV fistula  Postoperative diagnosis: Same  Anesthesia: Local  Operative findings: #1 tandem lesions right cephalic arch mild tortuosity of fistula  Operative details: After obtaining informed consent, the patient was taken to the [facility] lab.  The patient was placed in supine position Angio table.  Right upper extremity was prepped and draped in usual sterile fashion.  Local anesthesia was infiltrated over the fistula near the antecubital crease.  Micropuncture needle was used to cannulate the fistula and micropuncture needle advanced into the fistula.  Micropuncture sheath was then placed over this.  Contrast angiogram was then performed through the micropuncture sheath which showed some mild tortuosity of the fistula but also a tandem lesion in the cephalic arch both approaching 80%.  At this point the micropuncture sheath was cannulated with an 035 angled Glidewire and I was able to easily advance this into the central venous system.  The micropuncture sheath was exchanged for 6 French short sheath.  The patient was given 3000 units of intravenous heparin.  Next the lesion in the cephalic arch was predilated with a 7 x 40 Mustang balloon inflated to nominal pressure.  A 7 x 60 Lutonix balloon was then brought up on the field inflated to nominal pressure and held for 2 minutes.  Completion angiogram still showed a waist at the tandem sites in the cephalic arch.  I placed a 10 x 40 Mustang balloon and inflated this to nominal pressure and there was still a waist and pulsatility in the fistula.  Therefore the 6 French sheath was swapped out to a 7 [place] short sheath over the wire.  A 12 x 4 Atlas balloon was then brought up in the operative field and this was inflated 1st-12 atmospheres with a residual waist so it was reinflated to 18 atm at 1  minute with resolution of the waist.  Completion angiogram showed much improved flow with less than 20% residual stenosis and now a palpable thrill in the fistula.  Monocryl figure-of-eight stitch was placed at the skin exit site and the sheath removed.  Hemostasis was obtained.  The patient was taken to to the holding area in stable condition.  Operative management: The fistula is ready for use.  If recurrence of the stenosis occurs consideration would need to be given for cephalic turndown or possible covered stent.  Ruta Hinds, MD Vascular and Vein Specialists of [place] Office: [phone] Pager: [phone]

## 2018-03-24 ENCOUNTER — Telehealth: Payer: Self-pay | Admitting: *Deleted

## 2018-03-24 ENCOUNTER — Encounter (HOSPITAL_COMMUNITY): Payer: Self-pay | Admitting: Vascular Surgery

## 2018-03-24 NOTE — Telephone Encounter (Signed)
-----   Message from Penni Homans, RN sent at 03/21/2018 12:59 PM EDT ----- Just an FYI. Thanks! ----- Message ----- From: Elam Dutch, MD Sent: 03/21/2018  12:41 PM EDT To: Vvs Charge Pool  Procedure: Right arm fistulogram, angioplasty right cephalic arch 2 separate tandem lesions (7 x 60 Lutonix, 12 x 4 Atlas)    Operative management: The fistula is ready for use.  If recurrence of the stenosis occurs consideration would need to be given for cephalic turndown or possible covered stent.  Ruta Hinds, MD Vascular and Vein Specialists of [place] Office: [phone] Pager: [phone]

## 2018-03-24 NOTE — Telephone Encounter (Signed)
No further documentation.

## 2018-05-24 ENCOUNTER — Emergency Department (HOSPITAL_COMMUNITY): Payer: Medicaid Other

## 2018-05-24 ENCOUNTER — Other Ambulatory Visit: Payer: Self-pay

## 2018-05-24 ENCOUNTER — Emergency Department (HOSPITAL_COMMUNITY)
Admission: EM | Admit: 2018-05-24 | Discharge: 2018-05-24 | Disposition: A | Discharge status: Home / Self Care | Payer: Medicaid Other | Attending: Emergency Medicine | Admitting: Emergency Medicine

## 2018-05-24 DIAGNOSIS — E119 Type 2 diabetes mellitus without complications: Secondary | ICD-10-CM | POA: Diagnosis not present

## 2018-05-24 DIAGNOSIS — R569 Unspecified convulsions: Secondary | ICD-10-CM | POA: Diagnosis present

## 2018-05-24 DIAGNOSIS — I120 Hypertensive chronic kidney disease with stage 5 chronic kidney disease or end stage renal disease: Secondary | ICD-10-CM | POA: Diagnosis not present

## 2018-05-24 DIAGNOSIS — N186 End stage renal disease: Secondary | ICD-10-CM | POA: Diagnosis not present

## 2018-05-24 DIAGNOSIS — Z992 Dependence on renal dialysis: Secondary | ICD-10-CM | POA: Diagnosis not present

## 2018-05-24 DIAGNOSIS — Z79899 Other long term (current) drug therapy: Secondary | ICD-10-CM | POA: Insufficient documentation

## 2018-05-24 LAB — CBC WITH DIFFERENTIAL/PLATELET
Abs Immature Granulocytes: 0.02 10*3/uL (ref 0.00–0.07)
BASOS PCT: 0 %
Basophils Absolute: 0 10*3/uL (ref 0.0–0.1)
EOS PCT: 1 %
Eosinophils Absolute: 0.1 10*3/uL (ref 0.0–0.5)
HCT: 37.7 % — ABNORMAL LOW (ref 39.0–52.0)
HEMOGLOBIN: 12.3 g/dL — AB (ref 13.0–17.0)
Immature Granulocytes: 0 %
Lymphocytes Relative: 21 %
Lymphs Abs: 1.5 10*3/uL (ref 0.7–4.0)
MCH: 30.8 pg (ref 26.0–34.0)
MCHC: 32.6 g/dL (ref 30.0–36.0)
MCV: 94.3 fL (ref 80.0–100.0)
MONO ABS: 0.4 10*3/uL (ref 0.1–1.0)
MONOS PCT: 6 %
Neutro Abs: 5.3 10*3/uL (ref 1.7–7.7)
Neutrophils Relative %: 72 %
PLATELETS: 221 10*3/uL (ref 150–400)
RBC: 4 MIL/uL — AB (ref 4.22–5.81)
RDW: 12.3 % (ref 11.5–15.5)
WBC: 7.3 10*3/uL (ref 4.0–10.5)
nRBC: 0 % (ref 0.0–0.2)

## 2018-05-24 LAB — BASIC METABOLIC PANEL
Anion gap: 8 (ref 5–15)
BUN: 20 mg/dL (ref 6–20)
CALCIUM: 8.6 mg/dL — AB (ref 8.9–10.3)
CO2: 31 mmol/L (ref 22–32)
CREATININE: 5.9 mg/dL — AB (ref 0.61–1.24)
Chloride: 97 mmol/L — ABNORMAL LOW (ref 98–111)
GFR calc Af Amer: 11 mL/min — ABNORMAL LOW (ref >60)
GFR, EST NON AFRICAN AMERICAN: 10 mL/min — AB (ref >60)
GLUCOSE: 104 mg/dL — AB (ref 70–99)
Potassium: 3.2 mmol/L — ABNORMAL LOW (ref 3.5–5.1)
SODIUM: 136 mmol/L (ref 135–145)

## 2018-05-24 MED ORDER — LEVETIRACETAM 500 MG PO TABS
500.0000 mg | ORAL_TABLET | Freq: Once | ORAL | Status: AC
  Administered 2018-05-24: 500 mg via ORAL
  Filled 2018-05-24: qty 1

## 2018-05-24 MED ORDER — SODIUM CHLORIDE 0.9 % IV SOLN
INTRAVENOUS | Status: DC
  Administered 2018-05-24: 20:00:00 via INTRAVENOUS

## 2018-05-24 MED ORDER — LEVETIRACETAM 500 MG PO TABS: ORAL_TABLET | ORAL | 0 refills | Status: DC

## 2018-05-24 NOTE — Discharge Instructions (Addendum)
We are prescribing a medication to prevent seizures.  Start taking it tomorrow.  You will take 1 pill every morning and 1/2 tablet after each dialysis treatment.  He will need to follow-up with a neurologist for further evaluation and treatment.

## 2018-05-24 NOTE — ED Provider Notes (Signed)
[facility] EMERGENCY DEPARTMENT Provider Note   CSN: [id number] Arrival date & time: 05/24/18  1907     History   Chief Complaint Chief Complaint  Patient presents with  . Seizures    HPI Bryce Miller is a 57 y.o. male.  HPI   Patient is here for evaluation of "seizure.  He states he was at his sister's house when he had a seizure.  He does not remember any prodrome, and remembers the ambulance arriving to bring him to the emergency department.  At this time he denies headache, neck pain, chest pain, tongue pain, nausea, vomiting, weakness or dizziness.  He has end-stage renal disease and dialyzed yesterday.  He takes his medicines as prescribed.  There are no other known modifying factors.  Past Medical History:  Diagnosis Date  . Chronic kidney disease    functioning at 18%  . Constipation   . Diabetes mellitus without complication ([place])    "borderline"  . GERD (gastroesophageal reflux disease)   . Hypertension   . 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 Stroke [facility])    left side weakness    Patient Active Problem List   Diagnosis Date Noted  . ESRD (end stage renal disease) ([place]) 11/19/2017  . Fever 11/19/2017  . 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 Sepsis ([place]) 11/19/2017  . Chronic kidney disease (CKD), stage IV (severe) ([place]) 01/19/2016  . Colon cancer screening     Past Surgical History:  Procedure Laterality Date  . A/V SHUNTOGRAM Right 03/21/2018   Procedure: A/V SHUNTOGRAM;  Surgeon: Elam Dutch, MD;  Location: [place] CV LAB;  Service: Cardiovascular;  Laterality: Right;  . AV FISTULA PLACEMENT Right 12/06/2015   Procedure: ARTERIOVENOUS (AV) FISTULA CREATION-RIGHT upper arm ;  Surgeon: Conrad [place], MD;  Location: [place];  Service: Vascular;  Laterality: Right;  . COLONOSCOPY N/A 09/30/2014   Procedure: COLONOSCOPY;  Surgeon: Daneil Dolin, MD;  [facility];  Service: Endoscopy;  Laterality: N/A;  2:30 AM - moved to 1:10  - Ginger to notify pt  . CSF SHUNT    . PERIPHERAL VASCULAR BALLOON ANGIOPLASTY Right 03/21/2018   Procedure: PERIPHERAL VASCULAR BALLOON ANGIOPLASTY;  Surgeon: Elam Dutch, MD;  Location: [place] CV LAB;  Service: Cardiovascular;  Laterality: Right;  AV GRAFT        Home Medications    Prior to Admission medications   Medication Sig Start Date End Date Taking? Authorizing Provider  amLODipine (NORVASC) 5 MG tablet Take 5 mg by mouth daily.   Yes [provider]  calcitRIOL (ROCALTROL) 0.25 MCG capsule Take 0.25 mcg by mouth daily.   Yes [provider]  Cholecalciferol (VITAMIN D) 2000 units CAPS Take 2,000 Units by mouth daily.    Yes [provider]  enalapril (VASOTEC) 10 MG tablet Take 10 mg by mouth daily.   Yes [provider]  furosemide (LASIX) 40 MG tablet Take 40 mg by mouth daily.    Yes [provider]  hydrALAZINE (APRESOLINE) 50 MG tablet Take 50 mg by mouth 3 (three) times daily.    Yes [provider]  metoprolol (LOPRESSOR) 50 MG tablet Take 25 mg by mouth daily.    Yes [provider]  omeprazole (PRILOSEC) 20 MG capsule Take 20 mg by mouth daily.   Yes [provider]  polyethylene glycol (MIRALAX / GLYCOLAX) packet Take 17 g by mouth every Thursday.    Yes [provider]  sodium bicarbonate 650 MG tablet Take 1,300 mg by mouth 2 (two) times daily.   Yes [provider]  levETIRAcetam (KEPPRA) 500 MG tablet 1 daily every morning.  Also take 1/2 tablet after each dialysis treatment. 05/24/18   Daleen Bo, MD    Family History Family History  Problem Relation Age of Onset  . Diabetes Mother     Social History Social History   Tobacco Use  . Smoking status: Never Smoker  . Smokeless tobacco: Never Used  Substance Use Topics  . Alcohol use: No  . Drug use: Yes    Types: Cocaine    Comment: none since 2012     Allergies   Patient has no known  allergies.   Review of Systems Review of Systems  All other systems reviewed and are negative.    Physical Exam Updated Vital Signs BP 111/84   Pulse 83   Temp 98 F (36.7 C) (Oral)   Resp 18   Ht 5\' 10"  (1.778 m)   SpO2 100%   BMI 31.57 kg/m   Physical Exam  Constitutional: He is oriented to person, place, and time. He appears well-developed and well-nourished. No distress.  HENT:  Head: Normocephalic and atraumatic.  Right Ear: External ear normal.  Left Ear: External ear normal.  Tongue without laceration or abrasion  Eyes: Pupils are equal, round, and reactive to light. Conjunctivae and EOM are normal.  Neck: Normal range of motion and phonation normal. Neck supple.  Cardiovascular: Normal rate, regular rhythm and normal heart sounds.  Vascular fistula right upper arm with normal thrill.  Pulmonary/Chest: Effort normal and breath sounds normal. He exhibits no bony tenderness.  Abdominal: Soft. There is no tenderness.  Musculoskeletal: Normal range of motion.  Neurological: He is alert and oriented to person, place, and time. No cranial nerve deficit or sensory deficit. He exhibits normal muscle tone. Coordination normal.  No dysarthria, aphasia or nystagmus.  Skin: Skin is warm, dry and intact.  Psychiatric: He has a normal mood and affect. His behavior is normal. Judgment and thought content normal.  Nursing note and vitals reviewed.    ED Treatments / Results  Labs (all labs ordered are listed, but only abnormal results are displayed) Labs Reviewed  BASIC METABOLIC PANEL - Abnormal; Notable for the following components:      Result Value   Potassium 3.2 (*)    Chloride 97 (*)    Glucose, Bld 104 (*)    Creatinine, Ser 5.90 (*)    Calcium 8.6 (*)    GFR calc non Af Amer 10 (*)    GFR calc Af Amer 11 (*)    All other components within normal limits  CBC WITH DIFFERENTIAL/PLATELET - Abnormal; Notable for the following components:   RBC 4.00 (*)     Hemoglobin 12.3 (*)    HCT 37.7 (*)    All other components within normal limits    EKG None  Radiology Ct Head Wo Contrast  Result Date: 05/24/2018 CLINICAL DATA:  Seizure EXAM: CT HEAD WITHOUT CONTRAST TECHNIQUE: Contiguous axial images were obtained from the base of the skull through the vertex without intravenous contrast. COMPARISON:  None. FINDINGS: Brain: Advanced chronic small vessel disease throughout the deep white matter. Diffuse cerebral atrophy, advanced for patient's age. No acute intracranial abnormality. Specifically, no hemorrhage, hydrocephalus, mass lesion, acute infarction, or significant intracranial injury. Vascular: No hyperdense vessel or unexpected calcification. Skull: No acute calvarial abnormality. Sinuses/Orbits: Visualized paranasal sinuses and mastoids clear.  Orbital soft tissues unremarkable. Other: None IMPRESSION: Severe chronic small vessel disease. Diffuse cerebral atrophy. No acute intracranial abnormality. Electronically Signed   By: Rolm Baptise M.D.   On: 05/24/2018 20:16    Procedures .Critical Care Performed by: Daleen Bo, MD Authorized by: Daleen Bo, MD   Critical care provider statement:    Critical care time (minutes):  35   Critical care start time:  05/24/2018 7:10 PM   Critical care end time:  05/24/2018 9:34 PM   Critical care time was exclusive of:  Separately billable procedures and treating other patients   Critical care was necessary to treat or prevent imminent or life-threatening deterioration of the following conditions:  CNS failure or compromise   Critical care was time spent personally by me on the following activities:  Blood draw for specimens, development of treatment plan with patient or surrogate, discussions with consultants, evaluation of patient's response to treatment, examination of patient, obtaining history from patient or surrogate, ordering and performing treatments and interventions, ordering and review of  laboratory studies, pulse oximetry, re-evaluation of patient's condition, review of old charts and ordering and review of radiographic studies   (including critical care time)  Medications Ordered in ED Medications  0.9 %  sodium chloride infusion ( Intravenous New Bag/Given 05/24/18 1949)  levETIRAcetam (KEPPRA) tablet 500 mg (500 mg Oral Given 05/24/18 2134)     Initial Impression / Assessment and Plan / ED Course  I have reviewed the triage vital signs and the nursing notes.  Pertinent labs & imaging results that were available during my care of the patient were reviewed by me and considered in my medical decision making (see chart for details).  Clinical Course as of May 25 2143  Sat May 24, 2018  2049 Normal except potassium low, chloride low, glucose low, creatinine high, calcium low, GFR low  Basic metabolic panel(!) [EW]  0175 Normal except hemoglobin low  CBC with Differential(!) [EW]  2051 No bleeding or CVA.  Chronic changes.  Images reviewed by me  CT Head Wo Contrast [EW]  2132 I discussed the case with the tele-neurologist who recommends initiation of treatment for seizures with Keppra 500 daily, and extra Keppra 250 mg postdialysis 3 times a week.   [EW]    Clinical Course User Index [EW] Daleen Bo, MD     Patient Vitals for the past 24 hrs:  BP Temp Temp src Pulse Resp SpO2 Height  05/24/18 2134 - 98 F (36.7 C) Oral - - - -  05/24/18 2130 111/84 - - 83 - 100 % -  05/24/18 2100 99/70 - - 81 18 100 % -  05/24/18 2030 - - - - 13 - -  05/24/18 1915 - - - - - - 5\' 10"  (1.778 m)  05/24/18 1913 95/68 98.1 F (36.7 C) Oral 70 17 95 % -    9:33 PM Reevaluation with update and discussion. After initial assessment and treatment, an updated evaluation reveals patient remains stable, no seizure activity.  Discussed with patient and family members, all questions answered. Daleen Bo   Medical Decision Making: Transient altered mental status, possibly seizure,  post CVA remotely.  Patient is stable for discharge and outpatient treatment.  His dose Keppra started here.  CRITICAL CARE-yes Performed by: Daleen Bo  Nursing Notes Reviewed/ Care Coordinated Applicable Imaging Reviewed Interpretation of Laboratory Data incorporated into ED treatment  The patient appears reasonably screened and/or stabilized for discharge and I doubt any other medical condition or  other [place] requiring further screening, evaluation, or treatment in the ED at this time prior to discharge.  Plan: Home Medications-continue usual medications; Home Treatments-seizure precautions; return here if the recommended treatment, does not improve the symptoms; Recommended follow up-neurology follow-up 1 week and as needed   Final Clinical Impressions(s) / ED Diagnoses   Final diagnoses:  Seizure ([place])  End stage renal disease [facility])    ED Discharge Orders         Ordered    levETIRAcetam (KEPPRA) 500 MG tablet     05/24/18 2141           Daleen Bo, MD 05/25/18 1046

## 2018-05-24 NOTE — ED Triage Notes (Signed)
Patient had seizure at home for 5 minutes all over shaking with history  Of seizure . fire dept said he was  post itical at arrived Alert and oriented now CBG 119. HD MWF rt arm restriction. 80/55 before 500 ml bolus now 95/68 hr 79. Left side weakness from hx of CVA, wheelchair bound, wears depends baseline.

## 2019-09-14 IMAGING — CT CT HEAD W/O CM
3 series · 15 of 47 positions shown, 18 images · non-contrast
Comparison: None.

CLINICAL DATA: Seizure

EXAM:
CT HEAD WITHOUT CONTRAST
TECHNIQUE: Contiguous axial images were obtained from the base of the skull
through the vertex without intravenous contrast.

[Series 2: head trauma wo · axial · 0.48mm/px · z∈[+64,+189]mm · 9 of 31 slices shown, 12 images]
[im 3/31  brain]
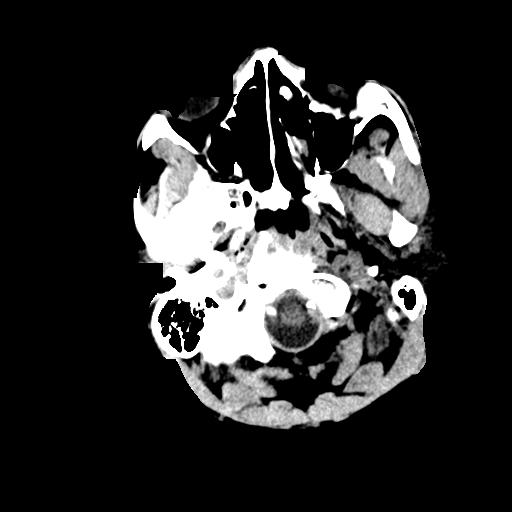
[im 3/31  bone]
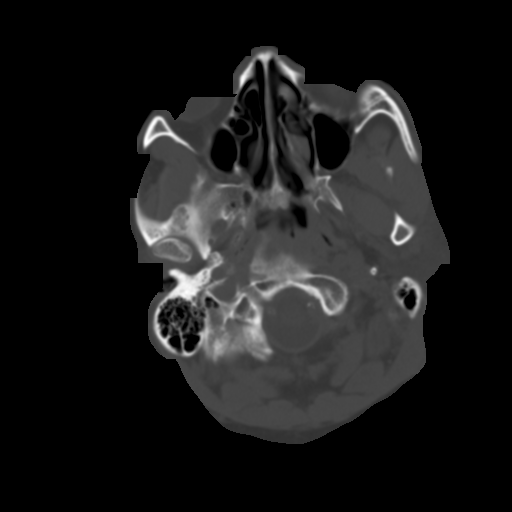
[im 6/31  brain]
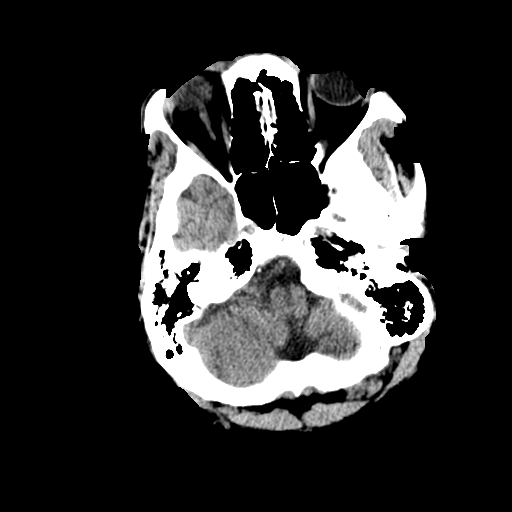
[im 9/31  brain]
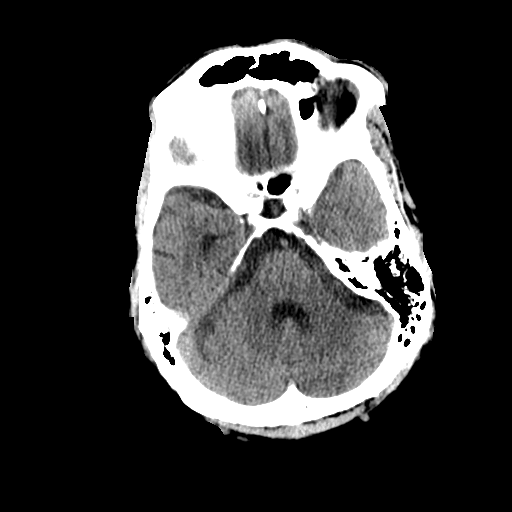
[im 12/31  brain]
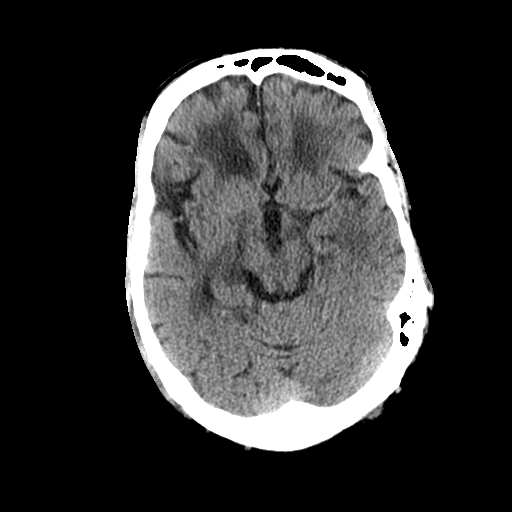
[im 16/31  brain]
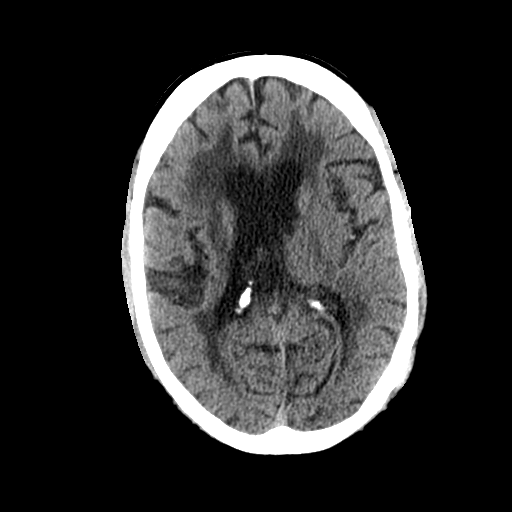
[im 16/31  bone]
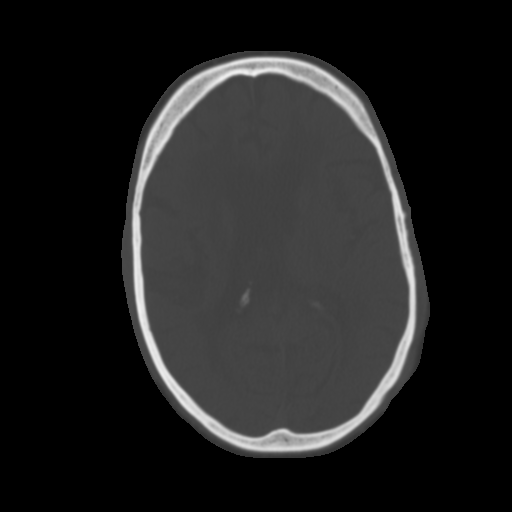
[im 19/31  brain]
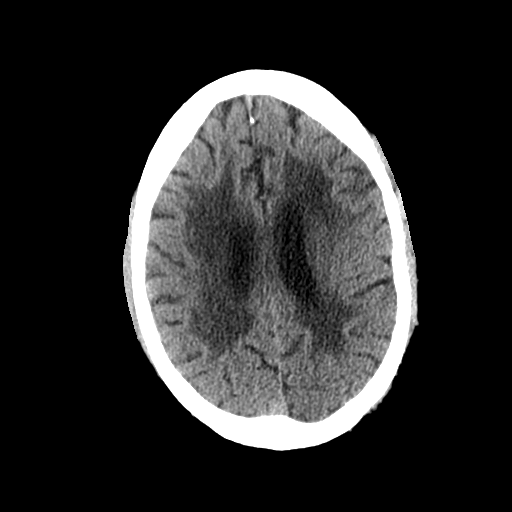
[im 22/31  brain]
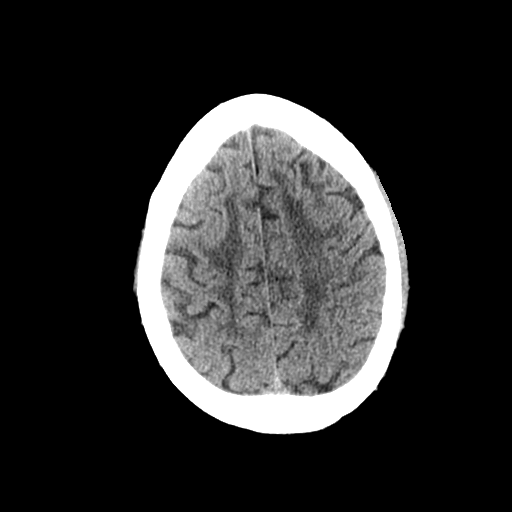
[im 25/31  brain]
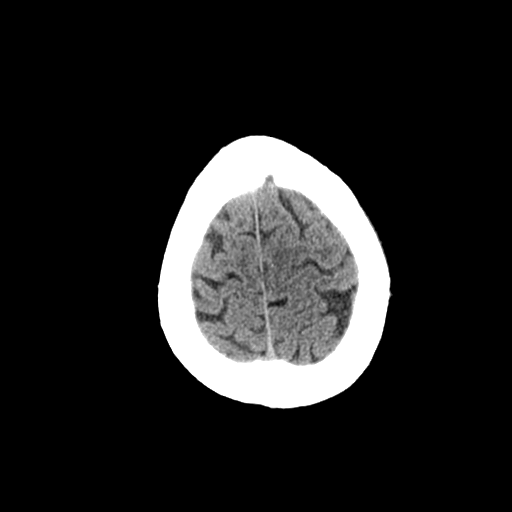
[im 28/31  brain]
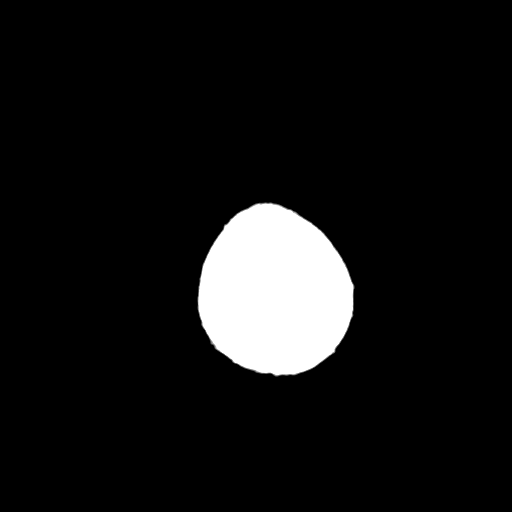
[im 28/31  bone]
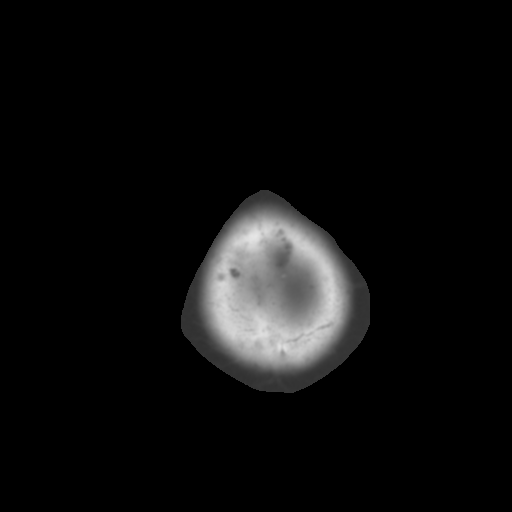

[Series 4: coronal soft tissue · coronal · 0.31mm/px · 3 of 81 slices shown]
[im 27/81  brain]
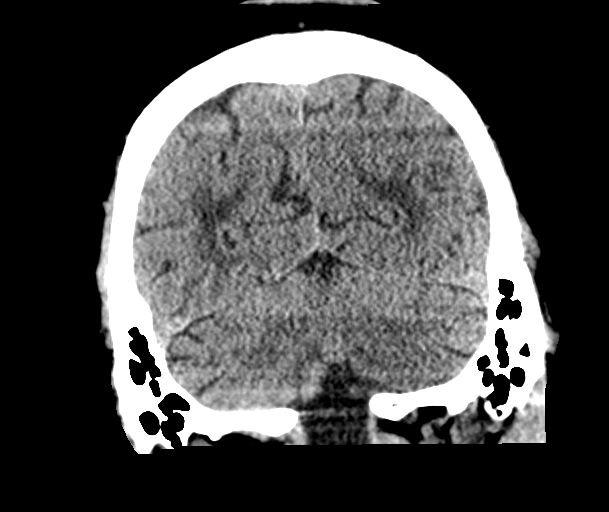
[im 36/81  brain]
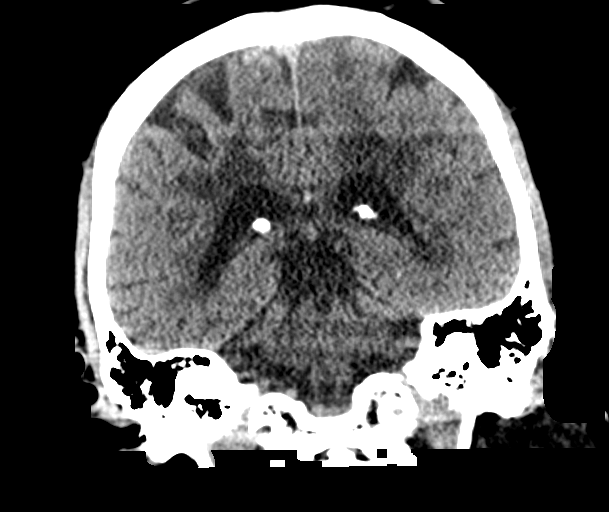
[im 45/81  brain]
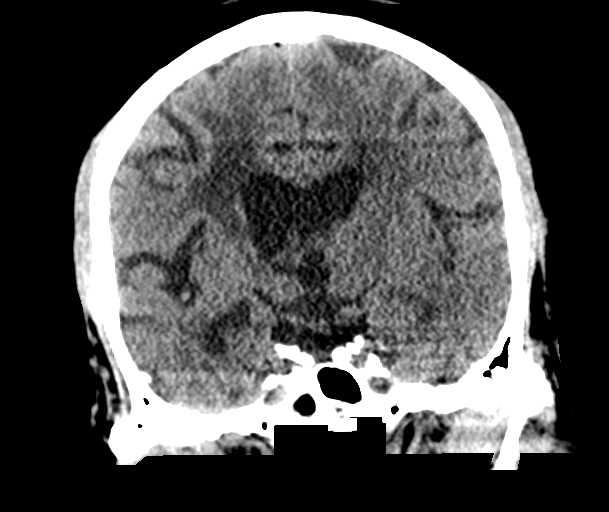

[Series 5: sagittal soft tissue · sagittal · 0.30mm/px · 3 of 57 slices shown]
[im 19/57  brain]
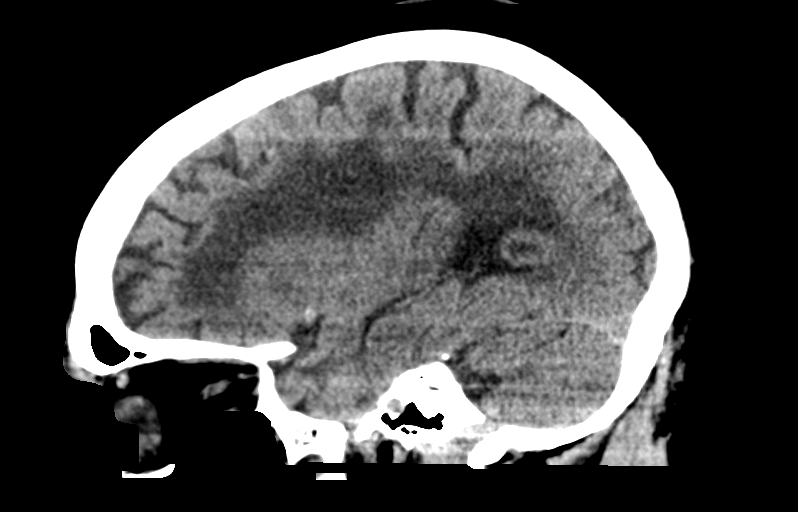
[im 29/57  brain]
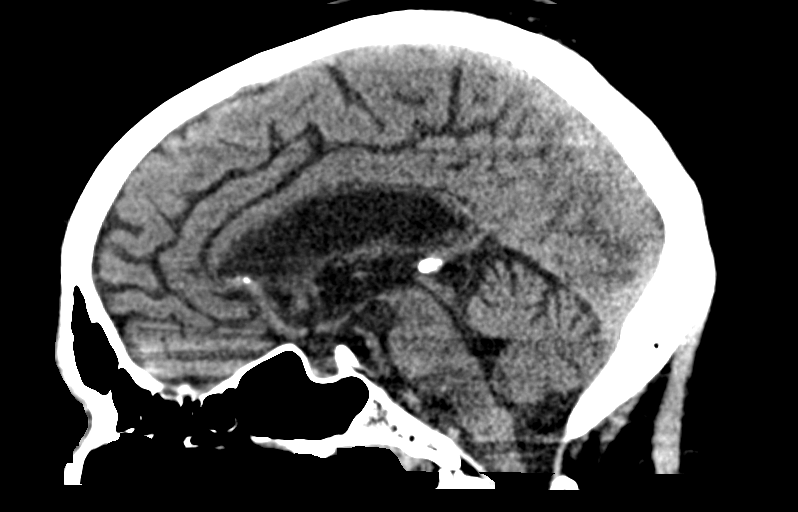
[im 38/57  brain]
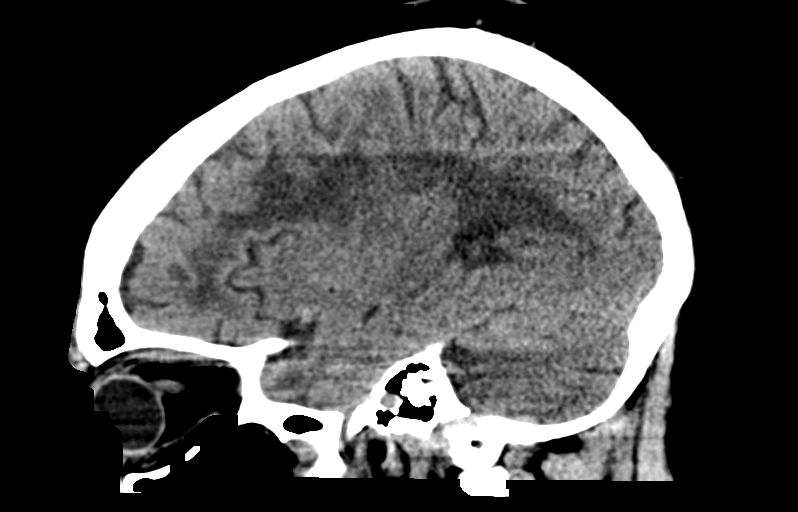

[15 of 47 positions shown; findings below may reference images not displayed]

FINDINGS: Brain: Advanced chronic small vessel disease throughout the deep
white matter. Diffuse cerebral atrophy, advanced for patient's age.
No acute intracranial abnormality. Specifically, no hemorrhage,
hydrocephalus, mass lesion, acute infarction, or significant
intracranial injury.

Vascular: No hyperdense vessel or unexpected calcification.

Skull: No acute calvarial abnormality.

Sinuses/Orbits: Visualized paranasal sinuses and mastoids clear.
Orbital soft tissues unremarkable.

Other: None
IMPRESSION: Severe chronic small vessel disease. Diffuse cerebral atrophy. No
acute intracranial abnormality.

## 2020-02-09 ENCOUNTER — Telehealth: Payer: Self-pay

## 2020-04-13 ENCOUNTER — Other Ambulatory Visit: Payer: Self-pay

## 2020-04-13 DIAGNOSIS — N186 End stage renal disease: Secondary | ICD-10-CM

## 2020-04-25 ENCOUNTER — Ambulatory Visit (INDEPENDENT_AMBULATORY_CARE_PROVIDER_SITE_OTHER): Payer: Medicaid Other | Admitting: Surgery

## 2020-04-25 ENCOUNTER — Other Ambulatory Visit: Payer: Self-pay

## 2020-04-25 ENCOUNTER — Encounter: Payer: Self-pay | Admitting: Surgery

## 2020-04-25 ENCOUNTER — Ambulatory Visit (HOSPITAL_COMMUNITY)
Admission: RE | Admit: 2020-04-25 | Discharge: 2020-04-25 | Disposition: A | Discharge status: Home / Self Care | Payer: Medicaid Other | Source: Ambulatory Visit | Attending: Surgery | Admitting: Surgery

## 2020-04-25 VITALS — BP 120/70 | HR 78 | Temp 98.0°F | Resp 20 | Ht 70.0 in | Wt 220.0 lb

## 2020-04-25 DIAGNOSIS — N186 End stage renal disease: Secondary | ICD-10-CM

## 2020-04-25 DIAGNOSIS — Z992 Dependence on renal dialysis: Secondary | ICD-10-CM

## 2020-04-25 MED ORDER — SODIUM CHLORIDE 0.9 % IV SOLN: 250.0000 mL | INTRAVENOUS | Status: AC | PRN

## 2020-04-25 NOTE — H&P (View-Only) (Signed)
[facility]  Patient name: Trevell Miller MRN: [id number] DOB: 14-Aug-1960 Sex: male   REASON FOR VISIT:    Follow up ESRD  HISOTRY OF PRESENT ILLNESS:    Bryce Miller is a 59 y.o. male who is status post right brachiocephalic fistula by Dr. Bridgett Larsson on 12/06/2015.  He underwent a fistulogram on 03/21/2018 for prolonged bleeding.  Tandem stenoses were identified within the cephalic vein arch.  These were initially treated with a 7 mm drug-coated balloon and ultimately a 12 x 40 Atlas balloon.  He is referred today secondary to high venous pressures on dialysis.  The patient's renal failure secondary to diabetes and hypertension.  He has a history of stroke with left-sided weakness.  He is on dialysis Tuesday Thursday Saturday.  He is here today with his brother.  PAST MEDICAL HISTORY:   Past Medical History:  Diagnosis Date  . Chronic kidney disease    functioning at 18%  . Constipation   . Diabetes mellitus without complication ([place])    "borderline"  . GERD (gastroesophageal reflux disease)   . Hypertension   . 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 Stroke [facility])    left side weakness     FAMILY HISTORY:   Family History  Problem Relation Age of Onset  . Diabetes Mother     SOCIAL HISTORY:   Social History   Tobacco Use  . Smoking status: Never Smoker  . Smokeless tobacco: Never Used  Substance Use Topics  . Alcohol use: No     ALLERGIES:   No Known Allergies   CURRENT MEDICATIONS:   Current Outpatient Medications  Medication Sig Dispense Refill  . furosemide (LASIX) 40 MG tablet Take 40 mg by mouth daily.     . hydrALAZINE (APRESOLINE) 50 MG tablet Take 50 mg by mouth 3 (three) times daily.     . metoprolol (LOPRESSOR) 50 MG tablet Take 25 mg by mouth daily.     Marland Kitchen omeprazole (PRILOSEC) 20 MG capsule Take 20 mg by mouth daily.    Marland Kitchen amLODipine (NORVASC) 5 MG tablet Take 5 mg by mouth daily.      . calcitRIOL (ROCALTROL) 0.25 MCG capsule Take 0.25 mcg by mouth daily.    . Cholecalciferol (VITAMIN D) 2000 units CAPS Take 2,000 Units by mouth daily.     . enalapril (VASOTEC) 10 MG tablet Take 10 mg by mouth daily.    Marland Kitchen levETIRAcetam (KEPPRA) 500 MG tablet 1 daily every morning.  Also take 1/2 tablet after each dialysis treatment. 45 tablet 0  . polyethylene glycol (MIRALAX / GLYCOLAX) packet Take 17 g by mouth every Thursday.     . sodium bicarbonate 650 MG tablet Take 1,300 mg by mouth 2 (two) times daily.     No current facility-administered medications for this visit.    REVIEW OF SYSTEMS:   [X]  denotes positive finding, [ ]  denotes negative finding Cardiac  Comments:  Chest pain or chest pressure:    Shortness of breath upon exertion:    Short of breath when lying flat:    Irregular heart rhythm:        Vascular    Pain in calf, thigh, or hip brought on by ambulation:    Pain in feet at night that wakes you up from your sleep:     Blood clot in your veins:    Leg swelling:         Pulmonary    Oxygen at home:  Productive cough:     Wheezing:         Neurologic    Sudden weakness in arms or legs:     Sudden numbness in arms or legs:     Sudden onset of difficulty speaking or slurred speech:    Temporary loss of vision in one eye:     Problems with dizziness:         Gastrointestinal    Blood in stool:     Vomited blood:         Genitourinary    Burning when urinating:     Blood in urine:        Psychiatric    Major depression:         Hematologic    Bleeding problems:    Problems with blood clotting too easily:        Skin    Rashes or ulcers:        Constitutional    Fever or chills:      PHYSICAL EXAM:   Vitals:   04/25/20 1401  BP: 120/70  Pulse: 78  Resp: 20  Temp: 98 F (36.7 C)  SpO2: 96%  Weight: 220 lb (99.8 kg)  Height: 5\' 10"  (1.778 m)    GENERAL: The patient is a well-nourished male, in no acute distress. The vital signs  are documented above. CARDIAC: There is a regular rate and rhythm.  VASCULAR: Ectatic right brachiocephalic fistula without skin breakdown PULMONARY: Non-labored respirations MUSCULOSKELETAL: There are no major deformities or cyanosis. NEUROLOGIC: Left-sided weakness SKIN: There are no ulcers or rashes noted.   STUDIES:   I have reviewed the following duplex: +------------+----------+-------------+----------+--------------+  OUTFLOW VEINPSV (cm/s)Diameter (cm)Depth (cm)  Describe    +------------+----------+-------------+----------+--------------+  Prox UA     226    1.43     0.30           +------------+----------+-------------+----------+--------------+  Mid UA     107    0.85     0.34           +------------+----------+-------------+----------+--------------+  Dist UA     35    2.95     0.33           +------------+----------+-------------+----------+--------------+  AC Fossa    >129   2.23>1.28   0.31  pseudoaneurysm  +------------+----------+-------------+----------+--------------+  Right subclavian vein distal 97 cm/s 1.55cm diameter right axillary artery  85 cm/s     MEDICAL ISSUES:   Elevated venous pressures: No obvious stenosis seen on the fistula ultrasound, however at his prior intervention the stenosis was more central.  Given that he is experiencing elevated venous pressures with dialysis, I think we need to proceed with a fistulogram to better evaluate the fistula as well as the central venous system, and intervene if indicated.  This was discussed with the patient.  We will schedule this on a nondialysis day in the near future.  He understands that one of my partners may be doing his procedure.    Leia Alf, MD, FACS [facility] [phone] Pager [phone]

## 2020-04-25 NOTE — Progress Notes (Signed)
Vascular and Vein Specialist of [facility]  Patient name: Bryce Miller MRN: [id number] DOB: January 15, 1961 Sex: male   REASON FOR VISIT:    Follow up ESRD  HISOTRY OF PRESENT ILLNESS:    Bryce Miller is a 59 y.o. male who is status post right brachiocephalic fistula by Dr. Bridgett Larsson on 12/06/2015.  He underwent a fistulogram on 03/21/2018 for prolonged bleeding.  Tandem stenoses were identified within the cephalic vein arch.  These were initially treated with a 7 mm drug-coated balloon and ultimately a 12 x 40 Atlas balloon.  He is referred today secondary to high venous pressures on dialysis.  The patient's renal failure secondary to diabetes and hypertension.  He has a history of stroke with left-sided weakness.  He is on dialysis Tuesday Thursday Saturday.  He is here today with his brother.  PAST MEDICAL HISTORY:   Past Medical History:  Diagnosis Date  . Chronic kidney disease    functioning at 18%  . Constipation   . Diabetes mellitus without complication ([place])    "borderline"  . GERD (gastroesophageal reflux disease)   . Hypertension   . 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 Stroke [facility])    left side weakness     FAMILY HISTORY:   Family History  Problem Relation Age of Onset  . Diabetes Mother     SOCIAL HISTORY:   Social History   Tobacco Use  . Smoking status: Never Smoker  . Smokeless tobacco: Never Used  Substance Use Topics  . Alcohol use: No     ALLERGIES:   No Known Allergies   CURRENT MEDICATIONS:   Current Outpatient Medications  Medication Sig Dispense Refill  . furosemide (LASIX) 40 MG tablet Take 40 mg by mouth daily.     . hydrALAZINE (APRESOLINE) 50 MG tablet Take 50 mg by mouth 3 (three) times daily.     . metoprolol (LOPRESSOR) 50 MG tablet Take 25 mg by mouth daily.     Marland Kitchen omeprazole (PRILOSEC) 20 MG capsule Take 20 mg by mouth daily.    Marland Kitchen amLODipine (NORVASC) 5 MG tablet Take 5 mg by mouth daily.      . calcitRIOL (ROCALTROL) 0.25 MCG capsule Take 0.25 mcg by mouth daily.    . Cholecalciferol (VITAMIN D) 2000 units CAPS Take 2,000 Units by mouth daily.     . enalapril (VASOTEC) 10 MG tablet Take 10 mg by mouth daily.    Marland Kitchen levETIRAcetam (KEPPRA) 500 MG tablet 1 daily every morning.  Also take 1/2 tablet after each dialysis treatment. 45 tablet 0  . polyethylene glycol (MIRALAX / GLYCOLAX) packet Take 17 g by mouth every Thursday.     . sodium bicarbonate 650 MG tablet Take 1,300 mg by mouth 2 (two) times daily.     No current facility-administered medications for this visit.    REVIEW OF SYSTEMS:   [X]  denotes positive finding, [ ]  denotes negative finding Cardiac  Comments:  Chest pain or chest pressure:    Shortness of breath upon exertion:    Short of breath when lying flat:    Irregular heart rhythm:        Vascular    Pain in calf, thigh, or hip brought on by ambulation:    Pain in feet at night that wakes you up from your sleep:     Blood clot in your veins:    Leg swelling:         Pulmonary    Oxygen at home:  Productive cough:     Wheezing:         Neurologic    Sudden weakness in arms or legs:     Sudden numbness in arms or legs:     Sudden onset of difficulty speaking or slurred speech:    Temporary loss of vision in one eye:     Problems with dizziness:         Gastrointestinal    Blood in stool:     Vomited blood:         Genitourinary    Burning when urinating:     Blood in urine:        Psychiatric    Major depression:         Hematologic    Bleeding problems:    Problems with blood clotting too easily:        Skin    Rashes or ulcers:        Constitutional    Fever or chills:      PHYSICAL EXAM:   Vitals:   04/25/20 1401  BP: 120/70  Pulse: 78  Resp: 20  Temp: 98 F (36.7 C)  SpO2: 96%  Weight: 220 lb (99.8 kg)  Height: 5\' 10"  (1.778 m)    GENERAL: The patient is a well-nourished male, in no acute distress. The vital signs  are documented above. CARDIAC: There is a regular rate and rhythm.  VASCULAR: Ectatic right brachiocephalic fistula without skin breakdown PULMONARY: Non-labored respirations MUSCULOSKELETAL: There are no major deformities or cyanosis. NEUROLOGIC: Left-sided weakness SKIN: There are no ulcers or rashes noted.   STUDIES:   I have reviewed the following duplex: +------------+----------+-------------+----------+--------------+  OUTFLOW VEINPSV (cm/s)Diameter (cm)Depth (cm)  Describe    +------------+----------+-------------+----------+--------------+  Prox UA     226    1.43     0.30           +------------+----------+-------------+----------+--------------+  Mid UA     107    0.85     0.34           +------------+----------+-------------+----------+--------------+  Dist UA     35    2.95     0.33           +------------+----------+-------------+----------+--------------+  AC Fossa    >129   2.23>1.28   0.31  pseudoaneurysm  +------------+----------+-------------+----------+--------------+  Right subclavian vein distal 97 cm/s 1.55cm diameter right axillary artery  85 cm/s     MEDICAL ISSUES:   Elevated venous pressures: No obvious stenosis seen on the fistula ultrasound, however at his prior intervention the stenosis was more central.  Given that he is experiencing elevated venous pressures with dialysis, I think we need to proceed with a fistulogram to better evaluate the fistula as well as the central venous system, and intervene if indicated.  This was discussed with the patient.  We will schedule this on a nondialysis day in the near future.  He understands that one of my partners may be doing his procedure.    Leia Alf, MD, FACS [facility] [phone] Pager [phone]

## 2020-05-05 ENCOUNTER — Inpatient Hospital Stay: Admission: RE | Admit: 2020-05-05 | Payer: Medicaid Other | Source: Ambulatory Visit

## 2020-05-06 ENCOUNTER — Telehealth: Payer: Self-pay

## 2020-05-06 NOTE — Telephone Encounter (Signed)
Updated pt to arrive for procedure by 1000 AM at [facility] for procedure on 05/09/20. Voiced understanding.

## 2020-05-09 ENCOUNTER — Other Ambulatory Visit: Payer: Self-pay

## 2020-05-09 ENCOUNTER — Encounter (HOSPITAL_COMMUNITY)
Admission: RE | Disposition: A | Discharge status: Home / Self Care | Payer: Self-pay | Source: Home / Self Care | Attending: Vascular Surgery

## 2020-05-09 ENCOUNTER — Ambulatory Visit (HOSPITAL_COMMUNITY)
Admission: RE | Admit: 2020-05-09 | Discharge: 2020-05-09 | Disposition: A | Discharge status: Home / Self Care | Payer: Medicaid Other | Attending: Vascular Surgery | Admitting: Vascular Surgery

## 2020-05-09 DIAGNOSIS — N186 End stage renal disease: Secondary | ICD-10-CM | POA: Diagnosis not present

## 2020-05-09 DIAGNOSIS — G8220 Paraplegia, unspecified: Secondary | ICD-10-CM | POA: Insufficient documentation

## 2020-05-09 DIAGNOSIS — T82858A Stenosis of vascular prosthetic devices, implants and grafts, initial encounter: Secondary | ICD-10-CM | POA: Diagnosis not present

## 2020-05-09 DIAGNOSIS — Z8673 Personal history of transient ischemic attack (TIA), and cerebral infarction without residual deficits: Secondary | ICD-10-CM | POA: Insufficient documentation

## 2020-05-09 DIAGNOSIS — Z20822 Contact with and (suspected) exposure to covid-19: Secondary | ICD-10-CM | POA: Diagnosis not present

## 2020-05-09 DIAGNOSIS — T82898A Other specified complication of vascular prosthetic devices, implants and grafts, initial encounter: Secondary | ICD-10-CM | POA: Diagnosis not present

## 2020-05-09 DIAGNOSIS — Z992 Dependence on renal dialysis: Secondary | ICD-10-CM | POA: Insufficient documentation

## 2020-05-09 DIAGNOSIS — E1122 Type 2 diabetes mellitus with diabetic chronic kidney disease: Secondary | ICD-10-CM | POA: Insufficient documentation

## 2020-05-09 DIAGNOSIS — K219 Gastro-esophageal reflux disease without esophagitis: Secondary | ICD-10-CM | POA: Insufficient documentation

## 2020-05-09 DIAGNOSIS — I120 Hypertensive chronic kidney disease with stage 5 chronic kidney disease or end stage renal disease: Secondary | ICD-10-CM | POA: Insufficient documentation

## 2020-05-09 DIAGNOSIS — Y841 Kidney dialysis as the cause of abnormal reaction of the patient, or of later complication, without mention of misadventure at the time of the procedure: Secondary | ICD-10-CM | POA: Insufficient documentation

## 2020-05-09 DIAGNOSIS — Z79899 Other long term (current) drug therapy: Secondary | ICD-10-CM | POA: Diagnosis not present

## 2020-05-09 HISTORY — PX: A/V FISTULAGRAM: CATH118298

## 2020-05-09 HISTORY — PX: PERIPHERAL VASCULAR INTERVENTION: CATH118257

## 2020-05-09 LAB — GLUCOSE, CAPILLARY: Glucose-Capillary: 91 mg/dL (ref 70–99)

## 2020-05-09 LAB — POCT I-STAT, CHEM 8
BUN: 51 mg/dL — ABNORMAL HIGH (ref 6–20)
Calcium, Ion: 1.24 mmol/L (ref 1.15–1.40)
Chloride: 97 mmol/L — ABNORMAL LOW (ref 98–111)
Creatinine, Ser: 11.5 mg/dL — ABNORMAL HIGH (ref 0.61–1.24)
Glucose, Bld: 89 mg/dL (ref 70–99)
HCT: 28 % — ABNORMAL LOW (ref 39.0–52.0)
Hemoglobin: 9.5 g/dL — ABNORMAL LOW (ref 13.0–17.0)
Potassium: 4.4 mmol/L (ref 3.5–5.1)
Sodium: 139 mmol/L (ref 135–145)
TCO2: 32 mmol/L (ref 22–32)

## 2020-05-09 LAB — SARS CORONAVIRUS 2 BY RT PCR (HOSPITAL ORDER, PERFORMED IN ~~LOC~~ HOSPITAL LAB): SARS Coronavirus 2: NEGATIVE

## 2020-05-09 SURGERY — A/V FISTULAGRAM
Anesthesia: LOCAL | Laterality: Right

## 2020-05-09 MED ORDER — IODIXANOL 320 MG/ML IV SOLN
INTRAVENOUS | Status: DC | PRN
  Administered 2020-05-09: 60 mL

## 2020-05-09 MED ORDER — LIDOCAINE HCL (PF) 1 % IJ SOLN
INTRAMUSCULAR | Status: DC | PRN
  Administered 2020-05-09: 2 mL via INTRADERMAL

## 2020-05-09 MED ORDER — HEPARIN (PORCINE) IN NACL 1000-0.9 UT/500ML-% IV SOLN
INTRAVENOUS | Status: AC
  Filled 2020-05-09: qty 500

## 2020-05-09 MED ORDER — SODIUM CHLORIDE 0.9% FLUSH: 3.0000 mL | Freq: Two times a day (BID) | INTRAVENOUS | Status: DC

## 2020-05-09 MED ORDER — HEPARIN (PORCINE) IN NACL 1000-0.9 UT/500ML-% IV SOLN
INTRAVENOUS | Status: DC | PRN
  Administered 2020-05-09: 500 mL

## 2020-05-09 MED ORDER — SODIUM CHLORIDE 0.9% FLUSH: 3.0000 mL | INTRAVENOUS | Status: DC | PRN

## 2020-05-09 MED ORDER — LIDOCAINE HCL (PF) 1 % IJ SOLN
INTRAMUSCULAR | Status: AC
  Filled 2020-05-09: qty 30

## 2020-05-09 SURGICAL SUPPLY — 21 items
BAG SNAP BAND KOVER 36X36 (MISCELLANEOUS) ×3 IMPLANT
BALLN MUSTANG 7.0X40 75 (BALLOONS) ×3
BALLOON MUSTANG 7.0X40 75 (BALLOONS) IMPLANT
CATH ANGIO 5F BER2 65CM (CATHETERS) ×1 IMPLANT
COVER DOME SNAP 22 D (MISCELLANEOUS) ×3 IMPLANT
KIT ENCORE 26 ADVANTAGE (KITS) ×1 IMPLANT
KIT MICROPUNCTURE NIT STIFF (SHEATH) ×1 IMPLANT
PROTECTION STATION PRESSURIZED (MISCELLANEOUS) ×3
SHEATH FLEXOR ANSEL 1 7F 45CM (SHEATH) ×1 IMPLANT
SHEATH PINNACLE R/O II 7F 4CM (SHEATH) ×1 IMPLANT
SHEATH PROBE COVER 6X72 (BAG) ×3 IMPLANT
STATION PROTECTION PRESSURIZED (MISCELLANEOUS) ×2 IMPLANT
STENT VIABAHN 7X50X120 (Permanent Stent) ×3 IMPLANT
STENT VIABAHN 7X5X120 7FR (Permanent Stent) IMPLANT
STOPCOCK MORSE 400PSI 3WAY (MISCELLANEOUS) ×3 IMPLANT
TRAY PV CATH (CUSTOM PROCEDURE TRAY) ×3 IMPLANT
TUBING CIL FLEX 10 FLL-RA (TUBING) ×3 IMPLANT
WIRE ROSEN-J .035X180CM (WIRE) ×1 IMPLANT
WIRE SPARTACORE .014X190CM (WIRE) ×1 IMPLANT
WIRE STARTER BENTSON 035X150 (WIRE) ×1 IMPLANT
WIRE ZILIENT 014 4G (WIRE) ×1 IMPLANT

## 2020-05-09 NOTE — Op Note (Signed)
    Patient name: Bryce Miller MRN: [id number] DOB: 1960-08-14 Sex: male  05/09/2020 Pre-operative Diagnosis: End-stage renal disease, malfunction right arm AV fistula Post-operative diagnosis:  Same Surgeon:  Eda Paschal. Donzetta Matters, MD Procedure Performed: 1.  Ultrasound-guided cannulation right arm AV fistula 2.  Stenting of right cephalic arch with 7 x 50 mm Viabahn 3.  Right upper extremity fistulogram  Indications: 59 year old male with history of end-stage renal disease dialyzes via right arm AV fistula.  He has had previous intervention on the cephalic arch.  He has had some low flows with outflow stenosis is indicated for fistula gram with possible invention.   Findings: Cephalic arch again had greater than 50% stenosis with collateralization.  This was primarily stented with a Viabahn stent at completion there was no residual stenosis improved thrill in the fistula.  Fistula is quite tortuous.  If he has persistent outflow issues he may benefit from cephalic vein turndown to the central venous system with possible straightening of the fistula as well concomitantly.   Procedure:  The patient was identified in the holding area and taken to room 8.  The patient was then placed supine on the table and prepped and draped in the usual sterile fashion.  A time out was called.  Ultrasound was used to evaluate the right arm AV fistula.  The area was anesthetized with 1% lidocaine cannulated with micropuncture needle followed by wire sheath.  Right upper extremity fistulogram was performed.  With the above findings we placed a Bentson wire 7 French sheath.  We used a Berenstein catheter we were able to get centrally.  We also performed dedicated images of cephalic arch with plans for primarily stenting.  We then exchanged for an 014 wire.  Unfortunately try to place a Viabahn stent we had significant tortuosity were unable to get the stent ago.  We then placed a Bentson wire centrally.  We placed a long 7  French sheath up to the cephalic arch.  We then placed a [place] core wire centrally.  We primarily stented with Viabahn this was postdilated with 7 mm balloon.  Retrograde imaging was not performed given the size of the fistula and the fact that the sheath was too deep to allow retrograde flow.  There was very strong inflow though.  There was improved thrill throughout the fistula after stenting.  We removed the wire and sheath sutured our access site with 4 Monocryl.  He tolerated procedure well without immediate complication.   Contrast: 60 cc  Mileydi Milsap C. Donzetta Matters, MD [facility] [place] Office: [phone] Pager: [phone]

## 2020-05-09 NOTE — Interval H&P Note (Signed)
History and Physical Interval Note:  05/09/2020 11:20 AM  Bryce Miller  has presented today for surgery, with the diagnosis of end stage renal.  The various methods of treatment have been discussed with the patient and family. After consideration of risks, benefits and other options for treatment, the patient has consented to  Procedure(s): A/V FISTULAGRAM (N/A) as a surgical intervention.  The patient's history has been reviewed, patient examined, no change in status, stable for surgery.  I have reviewed the patient's chart and labs.  Questions were answered to the patient's satisfaction.     [name]

## 2020-05-09 NOTE — Discharge Instructions (Signed)

## 2020-05-09 NOTE — Progress Notes (Signed)
Patient was given discharge instructions. He verbalized understanding. 

## 2020-05-10 ENCOUNTER — Encounter (HOSPITAL_COMMUNITY): Payer: Self-pay | Admitting: Vascular Surgery

## 2020-05-12 ENCOUNTER — Encounter (HOSPITAL_COMMUNITY): Payer: Self-pay | Admitting: Vascular Surgery

## 2020-05-30 ENCOUNTER — Other Ambulatory Visit (HOSPITAL_COMMUNITY): Payer: Self-pay | Admitting: Nephrology

## 2020-05-30 DIAGNOSIS — N186 End stage renal disease: Secondary | ICD-10-CM

## 2020-05-31 ENCOUNTER — Other Ambulatory Visit: Payer: Self-pay | Admitting: Radiology

## 2020-06-01 ENCOUNTER — Other Ambulatory Visit: Payer: Self-pay

## 2020-06-01 ENCOUNTER — Ambulatory Visit (HOSPITAL_COMMUNITY)
Admission: RE | Admit: 2020-06-01 | Discharge: 2020-06-01 | Disposition: A | Discharge status: Home / Self Care | Payer: Medicaid Other | Source: Ambulatory Visit | Attending: Nephrology | Admitting: Nephrology

## 2020-06-01 ENCOUNTER — Other Ambulatory Visit (HOSPITAL_COMMUNITY): Payer: Self-pay | Admitting: Nephrology

## 2020-06-01 ENCOUNTER — Encounter (HOSPITAL_COMMUNITY): Payer: Self-pay

## 2020-06-01 DIAGNOSIS — Z4901 Encounter for fitting and adjustment of extracorporeal dialysis catheter: Secondary | ICD-10-CM | POA: Insufficient documentation

## 2020-06-01 DIAGNOSIS — N186 End stage renal disease: Secondary | ICD-10-CM | POA: Diagnosis not present

## 2020-06-01 HISTORY — PX: IR DIALY SHUNT INTRO NEEDLE/INTRACATH INITIAL W/IMG LEFT: IMG6102

## 2020-06-01 HISTORY — PX: IR FLUORO GUIDE CV LINE RIGHT: IMG2283

## 2020-06-01 LAB — POCT I-STAT, CHEM 8
BUN: 82 mg/dL — ABNORMAL HIGH (ref 6–20)
Calcium, Ion: 1.13 mmol/L — ABNORMAL LOW (ref 1.15–1.40)
Chloride: 102 mmol/L (ref 98–111)
Creatinine, Ser: 17.4 mg/dL — ABNORMAL HIGH (ref 0.61–1.24)
Glucose, Bld: 97 mg/dL (ref 70–99)
HCT: 23 % — ABNORMAL LOW (ref 39.0–52.0)
Hemoglobin: 7.8 g/dL — ABNORMAL LOW (ref 13.0–17.0)
Potassium: 4 mmol/L (ref 3.5–5.1)
Sodium: 137 mmol/L (ref 135–145)
TCO2: 24 mmol/L (ref 22–32)

## 2020-06-01 LAB — GLUCOSE, CAPILLARY: Glucose-Capillary: 98 mg/dL (ref 70–99)

## 2020-06-01 MED ORDER — MIDAZOLAM HCL 2 MG/2ML IJ SOLN
INTRAMUSCULAR | Status: AC
  Filled 2020-06-01: qty 4

## 2020-06-01 MED ORDER — MIDAZOLAM HCL 2 MG/2ML IJ SOLN
INTRAMUSCULAR | Status: AC | PRN
  Administered 2020-06-01: 1 mg via INTRAVENOUS
  Administered 2020-06-01: 0.5 mg via INTRAVENOUS

## 2020-06-01 MED ORDER — HEPARIN SODIUM (PORCINE) 1000 UNIT/ML IJ SOLN
INTRAMUSCULAR | Status: AC
  Filled 2020-06-01: qty 1

## 2020-06-01 MED ORDER — SODIUM CHLORIDE 0.9 % IV SOLN
INTRAVENOUS | Status: AC | PRN
  Administered 2020-06-01: 10 mL/h via INTRAVENOUS

## 2020-06-01 MED ORDER — SODIUM CHLORIDE 0.9 % IV SOLN: INTRAVENOUS | Status: DC

## 2020-06-01 MED ORDER — FENTANYL CITRATE (PF) 100 MCG/2ML IJ SOLN
INTRAMUSCULAR | Status: AC
  Filled 2020-06-01: qty 2

## 2020-06-01 MED ORDER — IOHEXOL 300 MG/ML  SOLN
50.0000 mL | Freq: Once | INTRAMUSCULAR | Status: AC | PRN
  Administered 2020-06-01: 21 mL via INTRA_ARTERIAL

## 2020-06-01 MED ORDER — FENTANYL CITRATE (PF) 100 MCG/2ML IJ SOLN
INTRAMUSCULAR | Status: AC | PRN
Start: 2020-06-01 — End: 2020-06-01
  Administered 2020-06-01 (×3): 25 ug via INTRAVENOUS

## 2020-06-01 MED ORDER — CHLORHEXIDINE GLUCONATE 4 % EX LIQD
CUTANEOUS | Status: AC
  Filled 2020-06-01: qty 15

## 2020-06-01 MED ORDER — FENTANYL CITRATE (PF) 100 MCG/2ML IJ SOLN
INTRAMUSCULAR | Status: AC | PRN
Start: 2020-06-01 — End: 2020-06-01
  Administered 2020-06-01: 25 ug via INTRAVENOUS

## 2020-06-01 MED ORDER — MIDAZOLAM HCL 2 MG/2ML IJ SOLN
INTRAMUSCULAR | Status: AC | PRN
  Administered 2020-06-01: 0.5 mg via INTRAVENOUS

## 2020-06-01 MED ORDER — LIDOCAINE HCL 1 % IJ SOLN
INTRAMUSCULAR | Status: AC
  Filled 2020-06-01: qty 20

## 2020-06-01 NOTE — Procedures (Signed)
Interventional Radiology Procedure Note  Procedure:  1) Right upper extremity fistulagram 2) Superior venacavagram 3) Exchange of tunneled hemodialysis catheter  Findings: Please refer to procedural dictation for full description.  Diffusely aneurysmal and tortuous RUE fistula with patent anastomosis but partially occlusive tapering to occlusive thrombus in venous outflow.  Unable to traverse thrombus beyond mid upper arm with catheter and wire combinations due to extreme tortuosity and thrombus.    Blue port of indwelling tunneled line not aspirating.  Superior venacavagram through indwelling catheter without evidence of significant fibrin sheath or thrombus.  Catheter exchanged for 28 cm catheter, functioning well.  Complications: None immediate  Estimated Blood Loss: <10 mL  Recommendations: Avoid use of fistula at this time for HD.  Continue use of indwelling tunneled HD catheter. Defer to Vascular Surgery regarding management of fistula.   Ruthann Cancer, MD Pager: [phone]

## 2020-06-01 NOTE — Discharge Instructions (Addendum)
Dialysis Vascular Access Malfunction        A vascular access is an entrance to your blood vessels that can be used for dialysis. Dialysis is a treatment used for kidney failure. A health care provider can make a vascular access in many ways, such as by:  Joining an artery to a vein under your skin to make a bigger blood vessel called a fistula.  Joining an artery to a vein under your skin using a soft tube called a graft.  Placing a thin, flexible tube (catheter) in a large vein, usually in your neck, chest, or groin. A vascular access can become blocked or stop working correctly (malfunction). What can cause my vascular access to malfunction?  Infection. This is the most common cause of malfunction.  A blood clot inside a part of the fistula, graft, or catheter. A blood clot can completely or partially block the flow of blood.  A kink in the graft or catheter.  A collection of blood (hematoma or bruise) next to the graft or catheter that pushes against it, blocking the flow of blood. What are signs and symptoms of vascular access malfunction? Signs and symptoms of vascular access malfunction include:  A change in the vibration or pulse (thrill) of your fistula or graft.  The thrill of your fistula or graft being gone.  New or unusual swelling of the area around the access.  An unsuccessful puncture of your access by the dialysis team.  The flow of blood through the fistula, graft, or catheter being too slow for effective dialysis.  Bleeding that cannot be easily controlled when routine dialysis is completed and the needle is removed.  Signs of infection such as pain, swelling, redness, red streaks, numbness, and blood or pus coming from or around the access. What happens if my vascular access malfunctions? If your vascular access malfunctions, your health care provider may order blood work, cultures, or an X-ray test to find out what went wrong. The X-ray test involves the  injection of a dye (contrast) into the vascular access. The contrast shows up on the X-ray and lets your health care provider see if there is a blockage in the vascular access. Treatment varies depending on the cause of the malfunction:  If the vascular access is infected, your health care provider may prescribe antibiotic medicine to control the infection.  If a clot is found in the vascular access, you may need surgery to remove the clot. Blood thinning medicines may also be used.  If a blockage in the vascular access is due to some other cause, such as a kink in a graft, you will likely need surgery to unblock or replace the graft.  If there is a malfunction for any reason, your health care provider may remove and replace your access. Follow these instructions at home: Medicines  Take over-the-counter and prescription medicines only as told by your health care provider.  If you were prescribed an antibiotic medicine, use it as told by your health care provider. Do not stop using the antibiotic even if you start to feel better. Activity  Do not lift heavy things with the arm that has the access.  Try not to bump or hurt the arm that has the access. Lifestyle  Do not wear jewelry or tight clothing around the access.  Do not sleep by placing the access arm under your head or body. General instructions  Wash your hands with soap and water before touching the area around the  vascular access. If soap and water are not available, use hand sanitizer.  Keep all follow-up visits as told by your health care provider. This is very important. Any delay in follow-up could cause permanent dysfunction of the vascular access, which may be dangerous.  Do not measure your blood pressure on the arm that has the access.  Keep the access area clean and do not use makeup, creams, lotions, or ointments on it.  Check your access area every day for signs of infection. Check for: ? More redness,  swelling, or pain. ? More fluid or blood. ? Warmth. ? Pus or a bad smell. Contact a health care provider if:  Swelling around the vascular access gets worse.  You develop new pain. Get help right away if:  You have bleeding at the vascular access that cannot be easily controlled.  You have pain, numbness, an unusual pale skin color, or blue fingers or sores at the tips of your fingers in the hand on the side of your fistula.  You have chills.  You have a fever.  You have pus or other fluid (drainage) at the vascular access site.  You develop skin redness or red streaking on the skin around, above, or below the vascular access.  Your access is hot, swollen, red, and very painful.  You can suddenly see the cuff of your catheter used in the access. Summary  A vascular access is an entrance to your blood vessels that can be used for dialysis.  Several things can cause your vascular access to malfunction.  Treatment varies depending on the cause of the malfunction.  Keep all follow-up visits as told by your health care provider. Any delay in follow-up could cause permanent dysfunction of the vascular access, which may be dangerous. This information is not intended to replace advice given to you by your health care provider. Make sure you discuss any questions you have with your health care provider. Document Revised: 10/18/2017 Document Reviewed: 08/17/2016 Elsevier Patient Education  [place]. Moderate Conscious Sedation, Adult Sedation is the use of medicines to promote relaxation and relieve discomfort and anxiety. Moderate conscious sedation is a type of sedation. Under moderate conscious sedation, you are less alert than normal, but you are still able to respond to instructions, touch, or both. Moderate conscious sedation is used during short medical and dental procedures. It is milder than deep sedation, which is a type of sedation under which you cannot be easily  woken up. It is also milder than general anesthesia, which is the use of medicines to make you unconscious. Moderate conscious sedation allows you to return to your regular activities sooner. Tell a health care provider about:  Any allergies you have.  All medicines you are taking, including vitamins, herbs, eye drops, creams, and over-the-counter medicines.  Use of steroids (by mouth or creams).  Any problems you or family members have had with sedatives and anesthetic medicines.  Any blood disorders you have.  Any surgeries you have had.  Any medical conditions you have, such as sleep apnea.  Whether you are pregnant or may be pregnant.  Any use of cigarettes, alcohol, marijuana, or street drugs. What are the risks? Generally, this is a safe procedure. However, problems may occur, including:  Getting too much medicine (oversedation).  Nausea.  Allergic reaction to medicines.  Trouble breathing. If this happens, a breathing tube may be used to help with breathing. It will be removed when you are awake and breathing on  your own.  Heart trouble.  Lung trouble. What happens before the procedure? Staying hydrated Follow instructions from your health care provider about hydration, which may include:  Up to 2 hours before the procedure - you may continue to drink clear liquids, such as water, clear fruit juice, black coffee, and plain tea. Eating and drinking restrictions Follow instructions from your health care provider about eating and drinking, which may include:  8 hours before the procedure - stop eating heavy meals or foods such as meat, fried foods, or fatty foods.  6 hours before the procedure - stop eating light meals or foods, such as toast or cereal.  6 hours before the procedure - stop drinking milk or drinks that contain milk.  2 hours before the procedure - stop drinking clear liquids. Medicine Ask your health care provider about:  Changing or stopping  your regular medicines. This is especially important if you are taking diabetes medicines or blood thinners.  Taking medicines such as aspirin and ibuprofen. These medicines can thin your blood. Do not take these medicines before your procedure if your health care provider instructs you not to.  Tests and exams  You will have a physical exam.  You may have blood tests done to show: ? How well your kidneys and liver are working. ? How well your blood can clot. General instructions  Plan to have someone take you home from the hospital or clinic.  If you will be going home right after the procedure, plan to have someone with you for 24 hours. What happens during the procedure?  An IV tube will be inserted into one of your veins.  Medicine to help you relax (sedative) will be given through the IV tube.  The medical or dental procedure will be performed. What happens after the procedure?  Your blood pressure, heart rate, breathing rate, and blood oxygen level will be monitored often until the medicines you were given have worn off.  Do not drive for 24 hours. This information is not intended to replace advice given to you by your health care provider. Make sure you discuss any questions you have with your health care provider. Document Revised: 07/05/2017 Document Reviewed: 11/12/2015 Elsevier Patient Education  2020 [employer].

## 2020-06-01 NOTE — Sedation Documentation (Signed)
Patient is resting comfortably. 

## 2020-06-01 NOTE — Sedation Documentation (Signed)
Exchange tunneled HD cathter as a portion of the catheter is not working appropriately.

## 2020-06-01 NOTE — H&P (Addendum)
Chief Complaint: Patient was seen in consultation today for right upper arm dialysis fistula evaluation/intervention at the request of [place]  Referring Physician(s): Kolluru,Sarath  Supervising Physician: Dr Ruthann Cancer  Patient Status: [facility] - Out-pt  History of Present Illness: Bryce Miller is a 59 y.o. male   Hx HTN; paraplegia; CVA ESRD Clotted RUA fistula since Sat 05/28/20 Had left IJ dialysis catheter placed at [facility] 05/30/20 Last dialysis 10/25-- complete  Scheduled at [facility] for RUA fistula evaluation and intervention   Right brachiocephalic fistula by Dr. Bridgett Larsson on 12/06/2015.  Has had little issue with it since placement.  One intervention in 2019 per notes: Dr Oneida Alar: Procedure: Right arm fistulogram, angioplasty right cephalic arch tandem lesions (7 x 60 Lutonix, 12 x 4 Atlas) Preoperative diagnosis: Venous hypertension right arm AV fistula Postoperative diagnosis: Same Anesthesia: Local Operative findings: #1 tandem lesions right cephalic arch mild tortuosity of fistula  Was seen by Dr Donzetta Matters 05/09/20 for malfunction of fistula OP note 05/09/20:  Dr Fredrik Cove 05/09/2020 Pre-operative Diagnosis: End-stage renal disease, malfunction right arm AV fistula Post-operative diagnosis:  Same Surgeon:  Eda Paschal. Donzetta Matters, MD Procedure Performed: 1.  Ultrasound-guided cannulation right arm AV fistula 2.  Stenting of right cephalic arch with 7 x 50 mm Viabahn 3.  Right upper extremity fistulogram Indications: 59 year old male with history of end-stage renal disease dialyzes via right arm AV fistula.  He has had previous intervention on the cephalic arch.  He has had some low flows with outflow stenosis is indicated for fistula gram with possible invention.   Now scheduled in IR for evaluation with possible thrombolysis with possible thrombolysis/angioplasty/stent placement  Past Medical History:  Diagnosis Date  . Chronic kidney disease    functioning at 18%    . Constipation   . Diabetes mellitus without complication ([place])    "borderline"  . GERD (gastroesophageal reflux disease)   . Hypertension   . Paraplegic spinal paralysis ([place])   . Stroke [facility])    left side weakness    Past Surgical History:  Procedure Laterality Date  . A/V FISTULAGRAM N/A 05/09/2020   Procedure: A/V FISTULAGRAM;  Surgeon: Waynetta Sandy, MD;  Location: [place] CV LAB;  Service: Cardiovascular;  Laterality: N/A;  . A/V SHUNTOGRAM Right 03/21/2018   Procedure: A/V SHUNTOGRAM;  Surgeon: Elam Dutch, MD;  Location: [place] [facility];  Service: Cardiovascular;  Laterality: Right;  . AV FISTULA PLACEMENT Right 12/06/2015   Procedure: ARTERIOVENOUS (AV) FISTULA CREATION-RIGHT upper arm ;  Surgeon: Conrad Wilmore, MD;  Location: [place];  Service: Vascular;  Laterality: Right;  . COLONOSCOPY N/A 09/30/2014   Procedure: COLONOSCOPY;  Surgeon: Daneil Dolin, MD;  Location[facility];  Service: Endoscopy;  Laterality: N/A;  2:30 AM - moved to 1:10 - Ginger to notify pt  . CSF SHUNT    . PERIPHERAL VASCULAR BALLOON ANGIOPLASTY Right 03/21/2018   Procedure: PERIPHERAL VASCULAR BALLOON ANGIOPLASTY;  Surgeon: Elam Dutch, MD;  Location: [place] [facility];  Service: Cardiovascular;  Laterality: Right;  AV GRAFT  . PERIPHERAL VASCULAR INTERVENTION Right 05/09/2020   Procedure: PERIPHERAL VASCULAR INTERVENTION;  Surgeon: Waynetta Sandy, MD;  Location: [place] [facility];  Service: Cardiovascular;  Laterality: Right;    Allergies: Patient has no known allergies.  Medications: Prior to Admission medications   Medication Sig Start Date End Date Taking? Authorizing Provider  furosemide (LASIX) 40 MG tablet Take 40 mg by mouth daily.    Yes  [provider]  hydrALAZINE (APRESOLINE) 50 MG tablet Take 50 mg by mouth 3 (three) times daily.    Yes [provider]  metoprolol (LOPRESSOR) 50 MG tablet Take 25 mg by mouth daily.    Yes  [provider]  omeprazole (PRILOSEC) 20 MG capsule Take 20 mg by mouth daily.   Yes [provider]  sevelamer carbonate (RENVELA) 800 MG tablet Take 800-1,600 mg by mouth 3 (three) times daily with meals.   Yes [provider]  Cholecalciferol (VITAMIN D) 2000 units CAPS Take 2,000 Units by mouth daily.     [provider]  cholecalciferol (VITAMIN D) 25 MCG (1000 UNIT) tablet Take 1,000 Units by mouth daily.    [provider]     Family History  Problem Relation Age of Onset  . Diabetes Mother     Social History   Socioeconomic History  . Marital status: Divorced    Spouse name: Not on file  . Number of children: Not on file  . Years of education: Not on file  . Highest education level: Not on file  Occupational History  . Not on file  Tobacco Use  . Smoking status: Never Smoker  . Smokeless tobacco: Never Used  Vaping Use  . Vaping Use: Never used  Substance and Sexual Activity  . Alcohol use: No  . Drug use: Not Currently    Types: Cocaine    Comment: none since 2012  . Sexual activity: Not Currently  Other Topics Concern  . Not on file  Social History Narrative  . Not on file   Social Determinants of Health   Financial Resource Strain:   . Difficulty of Paying Living Expenses: Not on file  Food Insecurity:   . Worried About Charity fundraiser in the Last Year: Not on file  . Ran Out of Food in the Last Year: Not on file  Transportation Needs:   . Lack of Transportation (Medical): Not on file  . Lack of Transportation (Non-Medical): Not on file  Physical Activity:   . Days of Exercise per Week: Not on file  . Minutes of Exercise per Session: Not on file  Stress:   . Feeling of Stress : Not on file  Social Connections:   . Frequency of Communication with Friends and Family: Not on file  . Frequency of Social Gatherings with Friends and Family: Not on file  . Attends Religious Services: Not on file  . Active  Member of Clubs or Organizations: Not on file  . Attends Archivist Meetings: Not on file  . Marital Status: Not on file    Review of Systems: A 12 point ROS discussed and pertinent positives are indicated in the HPI above.  All other systems are negative.  Review of Systems  Constitutional: Negative for activity change, fatigue and fever.  Respiratory: Negative for cough and shortness of breath.   Cardiovascular: Negative for chest pain.  Gastrointestinal: Negative for abdominal pain.  Psychiatric/Behavioral: Positive for decreased concentration. Negative for behavioral problems.    Vital Signs: BP (!) 142/84   Pulse (!) 112   Temp 98.8 F (37.1 C) (Oral)   Resp 16   Ht 6' (1.829 m)   Wt 240 lb (108.9 kg)   SpO2 99%   BMI 32.55 kg/m   Physical Exam Vitals reviewed.  Cardiovascular:     Rate and Rhythm: Normal rate and regular rhythm.     Heart sounds: Normal heart sounds.  Pulmonary:     Effort: Pulmonary effort is normal.     Breath sounds: Normal breath sounds.  Abdominal:     Palpations: Abdomen is soft.  Musculoskeletal:        General: Normal range of motion.  Skin:    General: Skin is warm.  Neurological:     Mental Status: He is alert. Mental status is at baseline.     Comments: Pt has had CVA-- Answers most questions appropriately (cannot tell me date or yr) Moves all 4s; left extremities -- slow/weaker    Psychiatric:     Comments: Bryce Miller at bedside Consents for pt     Imaging: PERIPHERAL VASCULAR CATHETERIZATION  Result Date: 05/09/2020 DEFAULT Cardiovascular   Pt. Name: Bryce Miller                           Pt. DOB: [birth year]         ID: [id number]                               Pt. Age: 77 years Study Type: PERIPHERAL VASCULAR CATHETERIZATION   Exam Date: 05/09/2020 Ref. Phys.: [id number] Waynetta Sandy    Review Date: 05/09/2020  Diag Phys: Ruta Hinds MD                      Modality: Retta Diones  Indications: History:  //Unknown// Diagnosis: Impression: Summary:   Final     Labs:  CBC: Recent Labs    05/09/20 1205  HGB 9.5*  HCT 28.0*    COAGS: No results for input(s): INR, APTT in the last 8760 hours.  BMP: Recent Labs    05/09/20 1205  NA 139  K 4.4  CL 97*  GLUCOSE 89  BUN 51*  CREATININE 11.50*    LIVER FUNCTION TESTS: No results for input(s): BILITOT, AST, ALT, ALKPHOS, PROT, ALBUMIN in the last 8760 hours.  TUMOR MARKERS: No results for input(s): AFPTM, CEA, CA199, CHROMGRNA in the last 8760 hours.  Assessment and Plan:  ESRD Clotted RUA dialysis fistula Scheduled for evaluation and possible intervention in IR Risks and benefits discussed with the patient including, but not limited to bleeding, infection, vascular injury, pneumothorax which may require chest tube placement, air embolism or even death  All of the patient's questions were answered, patient is agreeable to proceed. Consent signed and in chart.   Thank you for this interesting consult.  I greatly enjoyed meeting Bryce Miller and look forward to participating in their care.  A copy of this report was sent to the requesting provider on this date.  Electronically Signed: Lavonia Drafts, PA-C 06/01/2020, 1:24 PM   I spent a total of  30 Minutes   in face to face in clinical consultation, greater than 50% of which was counseling/coordinating care for RUA fistula evaluation

## 2020-06-09 ENCOUNTER — Other Ambulatory Visit: Payer: Self-pay

## 2020-06-09 DIAGNOSIS — Z992 Dependence on renal dialysis: Secondary | ICD-10-CM

## 2020-06-09 DIAGNOSIS — N186 End stage renal disease: Secondary | ICD-10-CM

## 2020-06-15 ENCOUNTER — Ambulatory Visit (INDEPENDENT_AMBULATORY_CARE_PROVIDER_SITE_OTHER): Payer: Medicaid Other | Admitting: Physician Assistant

## 2020-06-15 ENCOUNTER — Other Ambulatory Visit: Payer: Self-pay

## 2020-06-15 ENCOUNTER — Ambulatory Visit (HOSPITAL_COMMUNITY)
Admission: RE | Admit: 2020-06-15 | Discharge: 2020-06-15 | Disposition: A | Discharge status: Home / Self Care | Payer: Medicaid Other | Source: Ambulatory Visit | Attending: Physician Assistant | Admitting: Physician Assistant

## 2020-06-15 VITALS — BP 122/84 | HR 86 | Temp 98.6°F | Resp 20 | Ht 72.0 in

## 2020-06-15 DIAGNOSIS — Z992 Dependence on renal dialysis: Secondary | ICD-10-CM | POA: Diagnosis not present

## 2020-06-15 DIAGNOSIS — N186 End stage renal disease: Secondary | ICD-10-CM

## 2020-06-15 NOTE — Progress Notes (Signed)
Established Dialysis Access   History of Present Illness   Bryce Miller is a 59 y.o. (Oct 10, 1960) male who presents for re-evaluation for permanent access. He is with his Nephew who helps provide history. The patient is right hand dominant.  He currently has a right brachiocephalic AV fistula that was placed by Dr. Bridgett Miller in 2017. He has been seen in follow up by Dr. Trula Miller and Dr. Donzetta Miller. He has had several interventions on his fistula for recurrent cephalic vein stenosis/ He recently had fistulogram on 05/09/20 by Dr. Donzetta Miller. He had recurrent cephalic arch stenosis which was treated with a Viabahn stent. There was no residual stenosis at the end of the procedure and fistula was working well. Dr. Jamse Miller did note that the fistula was very tortuous and that if there were persistent outflow issues that the patient would benefit from a cephalic vein turndown to the central venous system with possible straightening of the fistula. Patient reports that he has had pain and swelling in the arm around the aneurysms since the procedure, approximately 1 month. He subsequently had right upper extremity fistulogram by IR on 06/01/20. 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 He currently is using a left IJ TDC for dialysis at [facility] in [place] on Tues/ Thurs/ Sat  Current Outpatient Medications  Medication Sig Dispense Refill  . Cholecalciferol (VITAMIN D) 2000 units CAPS Take 2,000 Units by mouth daily.     . cholecalciferol (VITAMIN D) 25 MCG (1000 UNIT) tablet Take 1,000 Units by mouth daily.     . furosemide (LASIX) 40 MG tablet Take 40 mg by mouth daily.     . hydrALAZINE (APRESOLINE) 50 MG tablet Take 50 mg by mouth 3 (three) times daily.     . metoprolol (LOPRESSOR) 50 MG tablet Take 25 mg by mouth daily.     Marland Kitchen omeprazole (PRILOSEC) 20 MG capsule Take 20 mg by mouth daily.    . sevelamer carbonate (RENVELA) 800 MG tablet Take 800-1,600 mg by mouth 3 (three) times daily with meals.     Current Facility-Administered Medications  Medication Dose Route Frequency Provider Last Rate Last Admin  . 0.9 %  sodium chloride infusion  250 mL Intravenous PRN Bryce Sandy, MD        On ROS today: negative unless stated in HPI   Physical Examination   Vitals:   06/15/20 0925  BP: 122/84  Pulse: 86  Resp: 20  Temp: 98.6 F (37 C)  TempSrc: Temporal  SpO2: 100%  Height: 6' (1.829 m)   Body mass index is 32.55 kg/m.  Right upper extremity  2+ radial pulse, hand grip is 5/5, sensation in digits is  intact, palpable thrill, bruit can be auscultated. Fistula is tortuous and aneurysmal. Appears to be some swelling over mid portion of fistula which is tender. There is also some pulsatility in fistula                       Non-invasive Vascular Imaging    BUE Vein Mapping  (06/15/20):  R arm: acceptable vein conduits include the right Basilic Vein Diameter is 0.60-0.63 cm  L arm: contracted secondary to stroke so unable to visualize The BC fistula is patent but tortuous with diffusely aneurysmal cephalic vein   Medical Decision Making   Bryce Miller is a 59 y.o. male who presents with right upper extremity Brachiocephalic AV fistula. He has some recurrent issues with cephalic arch stenosis. Appears to have thrombus in cephalic vein that was not able to be traversed by IR during recent fistulogram. Patient additionally has a lot of tortuosity and aneurysm of the current AV fistula. He does not have left upper extremity as an option for access due to his  contracture. Duplex today shows adequate right upper extremity basilic vein conduit. I discussed patient with Dr. Donzetta Miller and he recommends right upper extremity basilic vein fistula vs. Graft. He feels that there should not be any issues with central venous occlusion and that hopefully this will exclude the issues with the cephalic vein occlusion. Will plan to ligate or possibly revise his current Brachiocephalic AV fistula at the time of surgery as well - I have called to discuss recommendation and plan with patients sister Bryce Miller who makes most of patients medical decisions. Discussed procedure with her and answered her questions. She is agreeable to consent to proceed - plan is for 1st stage right upper extremity brachiobasilic AV fistula vs graft, possible revision of right brachiocephalic fistula, possible ligation of right brachiocephalic AV fistula by Dr. Jeri Lager, PA-C [facility] [place] Office: [phone]  Clinic MD: Dr. Oneida Miller

## 2020-06-16 ENCOUNTER — Other Ambulatory Visit: Payer: Self-pay

## 2020-06-17 NOTE — Telephone Encounter (Signed)
Encounter opened in error

## 2020-06-24 ENCOUNTER — Ambulatory Visit: Payer: Medicaid Other | Admitting: Vascular Surgery

## 2020-07-05 ENCOUNTER — Encounter (HOSPITAL_COMMUNITY): Payer: Self-pay | Admitting: Emergency Medicine

## 2020-07-05 ENCOUNTER — Telehealth: Payer: Self-pay

## 2020-07-05 ENCOUNTER — Ambulatory Visit (HOSPITAL_COMMUNITY)
Admission: EM | Admit: 2020-07-05 | Discharge: 2020-07-05 | Disposition: A | Discharge status: Home / Self Care | Payer: Medicaid Other | Attending: Emergency Medicine | Admitting: Emergency Medicine

## 2020-07-05 ENCOUNTER — Other Ambulatory Visit (HOSPITAL_COMMUNITY): Payer: Self-pay | Admitting: Medical

## 2020-07-05 ENCOUNTER — Other Ambulatory Visit: Payer: Self-pay | Admitting: Student

## 2020-07-05 ENCOUNTER — Emergency Department (HOSPITAL_COMMUNITY): Payer: Medicaid Other | Admitting: Anesthesiology

## 2020-07-05 ENCOUNTER — Emergency Department (HOSPITAL_COMMUNITY): Payer: Medicaid Other

## 2020-07-05 ENCOUNTER — Encounter (HOSPITAL_COMMUNITY)
Admission: EM | Disposition: A | Discharge status: Home / Self Care | Payer: Self-pay | Source: Home / Self Care | Attending: Emergency Medicine

## 2020-07-05 DIAGNOSIS — Z95828 Presence of other vascular implants and grafts: Secondary | ICD-10-CM

## 2020-07-05 DIAGNOSIS — E1122 Type 2 diabetes mellitus with diabetic chronic kidney disease: Secondary | ICD-10-CM | POA: Insufficient documentation

## 2020-07-05 DIAGNOSIS — I69354 Hemiplegia and hemiparesis following cerebral infarction affecting left non-dominant side: Secondary | ICD-10-CM | POA: Diagnosis not present

## 2020-07-05 DIAGNOSIS — Z20822 Contact with and (suspected) exposure to covid-19: Secondary | ICD-10-CM | POA: Insufficient documentation

## 2020-07-05 DIAGNOSIS — D631 Anemia in chronic kidney disease: Secondary | ICD-10-CM | POA: Insufficient documentation

## 2020-07-05 DIAGNOSIS — Z79899 Other long term (current) drug therapy: Secondary | ICD-10-CM | POA: Diagnosis not present

## 2020-07-05 DIAGNOSIS — Z789 Other specified health status: Secondary | ICD-10-CM

## 2020-07-05 DIAGNOSIS — Z992 Dependence on renal dialysis: Secondary | ICD-10-CM

## 2020-07-05 DIAGNOSIS — I120 Hypertensive chronic kidney disease with stage 5 chronic kidney disease or end stage renal disease: Secondary | ICD-10-CM | POA: Insufficient documentation

## 2020-07-05 DIAGNOSIS — Y832 Surgical operation with anastomosis, bypass or graft as the cause of abnormal reaction of the patient, or of later complication, without mention of misadventure at the time of the procedure: Secondary | ICD-10-CM | POA: Diagnosis not present

## 2020-07-05 DIAGNOSIS — N186 End stage renal disease: Secondary | ICD-10-CM

## 2020-07-05 DIAGNOSIS — T827XXA Infection and inflammatory reaction due to other cardiac and vascular devices, implants and grafts, initial encounter: Secondary | ICD-10-CM | POA: Diagnosis not present

## 2020-07-05 DIAGNOSIS — T82898A Other specified complication of vascular prosthetic devices, implants and grafts, initial encounter: Secondary | ICD-10-CM | POA: Insufficient documentation

## 2020-07-05 DIAGNOSIS — Z419 Encounter for procedure for purposes other than remedying health state, unspecified: Secondary | ICD-10-CM

## 2020-07-05 HISTORY — PX: INSERTION OF DIALYSIS CATHETER: SHX1324

## 2020-07-05 HISTORY — PX: LIGATION ARTERIOVENOUS GORTEX GRAFT: SHX5947

## 2020-07-05 LAB — BASIC METABOLIC PANEL
Anion gap: 18 — ABNORMAL HIGH (ref 5–15)
BUN: 90 mg/dL — ABNORMAL HIGH (ref 6–20)
CO2: 19 mmol/L — ABNORMAL LOW (ref 22–32)
Calcium: 9.5 mg/dL (ref 8.9–10.3)
Chloride: 97 mmol/L — ABNORMAL LOW (ref 98–111)
Creatinine, Ser: 14.78 mg/dL — ABNORMAL HIGH (ref 0.61–1.24)
GFR, Estimated: 3 mL/min — ABNORMAL LOW (ref >60)
Glucose, Bld: 92 mg/dL (ref 70–99)
Potassium: 3.6 mmol/L (ref 3.5–5.1)
Sodium: 134 mmol/L — ABNORMAL LOW (ref 135–145)

## 2020-07-05 LAB — CBC WITH DIFFERENTIAL/PLATELET
Abs Immature Granulocytes: 0.03 10*3/uL (ref 0.00–0.07)
Basophils Absolute: 0 10*3/uL (ref 0.0–0.1)
Basophils Relative: 0 %
Eosinophils Absolute: 0.1 10*3/uL (ref 0.0–0.5)
Eosinophils Relative: 1 %
HCT: 25.1 % — ABNORMAL LOW (ref 39.0–52.0)
Hemoglobin: 7.9 g/dL — ABNORMAL LOW (ref 13.0–17.0)
Immature Granulocytes: 0 %
Lymphocytes Relative: 21 %
Lymphs Abs: 1.6 10*3/uL (ref 0.7–4.0)
MCH: 29.3 pg (ref 26.0–34.0)
MCHC: 31.5 g/dL (ref 30.0–36.0)
MCV: 93 fL (ref 80.0–100.0)
Monocytes Absolute: 0.6 10*3/uL (ref 0.1–1.0)
Monocytes Relative: 8 %
Neutro Abs: 5.5 10*3/uL (ref 1.7–7.7)
Neutrophils Relative %: 70 %
Platelets: 365 10*3/uL (ref 150–400)
RBC: 2.7 MIL/uL — ABNORMAL LOW (ref 4.22–5.81)
RDW: 13.3 % (ref 11.5–15.5)
WBC: 7.8 10*3/uL (ref 4.0–10.5)
nRBC: 0 % (ref 0.0–0.2)

## 2020-07-05 LAB — GLUCOSE, CAPILLARY
Glucose-Capillary: 86 mg/dL (ref 70–99)
Glucose-Capillary: 93 mg/dL (ref 70–99)

## 2020-07-05 LAB — RESP PANEL BY RT-PCR (FLU A&B, COVID) ARPGX2
Influenza A by PCR: NEGATIVE
Influenza B by PCR: NEGATIVE
SARS Coronavirus 2 by RT PCR: NEGATIVE

## 2020-07-05 SURGERY — LIGATION ARTERIOVENOUS GORTEX GRAFT
Anesthesia: General | Site: Neck | Laterality: Right

## 2020-07-05 MED ORDER — FENTANYL CITRATE (PF) 100 MCG/2ML IJ SOLN
INTRAMUSCULAR | Status: DC | PRN
  Administered 2020-07-05: 25 ug via INTRAVENOUS
  Administered 2020-07-05 (×2): 50 ug via INTRAVENOUS
  Administered 2020-07-05: 25 ug via INTRAVENOUS
  Administered 2020-07-05 (×2): 50 ug via INTRAVENOUS

## 2020-07-05 MED ORDER — SODIUM CHLORIDE 0.9 % IV SOLN: INTRAVENOUS | Status: DC

## 2020-07-05 MED ORDER — FENTANYL CITRATE (PF) 100 MCG/2ML IJ SOLN
50.0000 ug | Freq: Once | INTRAMUSCULAR | Status: AC
  Administered 2020-07-05: 50 ug via INTRAVENOUS
  Filled 2020-07-05: qty 2

## 2020-07-05 MED ORDER — LIDOCAINE HCL (PF) 1 % IJ SOLN
INTRAMUSCULAR | Status: AC
  Filled 2020-07-05: qty 30

## 2020-07-05 MED ORDER — LIDOCAINE-EPINEPHRINE (PF) 1 %-1:200000 IJ SOLN
INTRAMUSCULAR | Status: AC
  Filled 2020-07-05: qty 30

## 2020-07-05 MED ORDER — ONDANSETRON HCL 4 MG/2ML IJ SOLN
INTRAMUSCULAR | Status: DC | PRN
  Administered 2020-07-05: 4 mg via INTRAVENOUS

## 2020-07-05 MED ORDER — 0.9 % SODIUM CHLORIDE (POUR BTL) OPTIME
TOPICAL | Status: DC | PRN
  Administered 2020-07-05: 1000 mL

## 2020-07-05 MED ORDER — FENTANYL CITRATE (PF) 100 MCG/2ML IJ SOLN
INTRAMUSCULAR | Status: AC
  Administered 2020-07-05: 50 ug via INTRAVENOUS
  Filled 2020-07-05: qty 2

## 2020-07-05 MED ORDER — CHLORHEXIDINE GLUCONATE 0.12 % MT SOLN
OROMUCOSAL | Status: AC
  Administered 2020-07-05: 15 mL via OROMUCOSAL
  Filled 2020-07-05: qty 15

## 2020-07-05 MED ORDER — ONDANSETRON HCL 4 MG/2ML IJ SOLN
INTRAMUSCULAR | Status: AC
  Filled 2020-07-05: qty 2

## 2020-07-05 MED ORDER — PHENYLEPHRINE HCL-NACL 10-0.9 MG/250ML-% IV SOLN
INTRAVENOUS | Status: DC | PRN
  Administered 2020-07-05: 25 ug/min via INTRAVENOUS

## 2020-07-05 MED ORDER — ONDANSETRON HCL 4 MG/2ML IJ SOLN: 4.0000 mg | Freq: Once | INTRAMUSCULAR | Status: DC | PRN

## 2020-07-05 MED ORDER — LIDOCAINE-EPINEPHRINE (PF) 1 %-1:200000 IJ SOLN
INTRAMUSCULAR | Status: DC | PRN
  Administered 2020-07-05 (×2): 30 mL

## 2020-07-05 MED ORDER — LABETALOL HCL 5 MG/ML IV SOLN: 5.0000 mg | Freq: Once | INTRAVENOUS | Status: AC

## 2020-07-05 MED ORDER — OXYCODONE HCL 5 MG/5ML PO SOLN: 5.0000 mg | Freq: Once | ORAL | Status: DC | PRN

## 2020-07-05 MED ORDER — LABETALOL HCL 5 MG/ML IV SOLN
INTRAVENOUS | Status: AC
  Administered 2020-07-05: 5 mg via INTRAVENOUS
  Filled 2020-07-05: qty 4

## 2020-07-05 MED ORDER — SODIUM CHLORIDE 0.9 % IV SOLN: INTRAVENOUS | Status: DC | PRN

## 2020-07-05 MED ORDER — CEFAZOLIN SODIUM-DEXTROSE 2-4 GM/100ML-% IV SOLN
2.0000 g | Freq: Once | INTRAVENOUS | Status: AC
  Administered 2020-07-05: 2 g via INTRAVENOUS

## 2020-07-05 MED ORDER — PROPOFOL 10 MG/ML IV BOLUS
INTRAVENOUS | Status: AC
  Filled 2020-07-05: qty 20

## 2020-07-05 MED ORDER — CEFAZOLIN SODIUM-DEXTROSE 2-4 GM/100ML-% IV SOLN
INTRAVENOUS | Status: AC
  Filled 2020-07-05: qty 100

## 2020-07-05 MED ORDER — LIDOCAINE 2% (20 MG/ML) 5 ML SYRINGE
INTRAMUSCULAR | Status: DC | PRN
  Administered 2020-07-05: 60 mg via INTRAVENOUS

## 2020-07-05 MED ORDER — LIDOCAINE HCL (PF) 2 % IJ SOLN
INTRAMUSCULAR | Status: AC
  Filled 2020-07-05: qty 5

## 2020-07-05 MED ORDER — ONDANSETRON HCL 4 MG/2ML IJ SOLN
4.0000 mg | Freq: Once | INTRAMUSCULAR | Status: AC
  Administered 2020-07-05: 4 mg via INTRAVENOUS
  Filled 2020-07-05: qty 2

## 2020-07-05 MED ORDER — FENTANYL CITRATE (PF) 100 MCG/2ML IJ SOLN: 25.0000 ug | INTRAMUSCULAR | Status: DC | PRN

## 2020-07-05 MED ORDER — PROPOFOL 10 MG/ML IV BOLUS
INTRAVENOUS | Status: DC | PRN
  Administered 2020-07-05: 20 mg via INTRAVENOUS
  Administered 2020-07-05: 120 mg via INTRAVENOUS

## 2020-07-05 MED ORDER — THROMBIN (RECOMBINANT) 20000 UNITS EX SOLR
CUTANEOUS | Status: AC
  Filled 2020-07-05: qty 20000

## 2020-07-05 MED ORDER — HEPARIN SODIUM (PORCINE) 1000 UNIT/ML IJ SOLN
INTRAMUSCULAR | Status: AC
  Filled 2020-07-05: qty 1

## 2020-07-05 MED ORDER — CHLORHEXIDINE GLUCONATE 0.12 % MT SOLN: 15.0000 mL | Freq: Once | OROMUCOSAL | Status: AC

## 2020-07-05 MED ORDER — LABETALOL HCL 5 MG/ML IV SOLN
5.0000 mg | Freq: Once | INTRAVENOUS | Status: AC
  Administered 2020-07-05: 5 mg via INTRAVENOUS

## 2020-07-05 MED ORDER — SODIUM CHLORIDE 0.9 % IV SOLN
INTRAVENOUS | Status: AC
  Filled 2020-07-05: qty 1.2

## 2020-07-05 MED ORDER — FENTANYL CITRATE (PF) 250 MCG/5ML IJ SOLN
INTRAMUSCULAR | Status: AC
  Filled 2020-07-05: qty 5

## 2020-07-05 MED ORDER — HEPARIN SODIUM (PORCINE) 1000 UNIT/ML IJ SOLN
INTRAMUSCULAR | Status: DC | PRN
  Administered 2020-07-05: 1000 [IU] via INTRAVENOUS

## 2020-07-05 MED ORDER — OXYCODONE HCL 5 MG PO TABS: 5.0000 mg | ORAL_TABLET | Freq: Once | ORAL | Status: DC | PRN

## 2020-07-05 MED ORDER — OXYCODONE-ACETAMINOPHEN 5-325 MG PO TABS
1.0000 | ORAL_TABLET | ORAL | 0 refills | Status: DC | PRN
Start: 2020-07-05 — End: 2020-08-15

## 2020-07-05 SURGICAL SUPPLY — 63 items
ADH SKN CLS APL DERMABOND .7 (GAUZE/BANDAGES/DRESSINGS) ×2
APL PRP STRL LF DISP 70% ISPRP (MISCELLANEOUS) ×4
BAG DECANTER FOR FLEXI CONT (MISCELLANEOUS) ×2 IMPLANT
BIOPATCH RED 1 DISK 7.0 (GAUZE/BANDAGES/DRESSINGS) ×3 IMPLANT
BIOPATCH RED 1IN DISK 7.0MM (GAUZE/BANDAGES/DRESSINGS) ×1
BNDG ELASTIC 4X5.8 VLCR STR LF (GAUZE/BANDAGES/DRESSINGS) ×2 IMPLANT
BNDG GAUZE ELAST 4 BULKY (GAUZE/BANDAGES/DRESSINGS) ×2 IMPLANT
CANISTER SUCT 3000ML PPV (MISCELLANEOUS) ×4 IMPLANT
CATH PALINDROME-P 19CM W/VT (CATHETERS) ×2 IMPLANT
CATH PALINDROME-P 23CM W/VT (CATHETERS) IMPLANT
CATH PALINDROME-P 28CM W/VT (CATHETERS) IMPLANT
CHLORAPREP W/TINT 26 (MISCELLANEOUS) ×6 IMPLANT
COVER PROBE W GEL 5X96 (DRAPES) ×2 IMPLANT
COVER SURGICAL LIGHT HANDLE (MISCELLANEOUS) ×4 IMPLANT
COVER WAND RF STERILE (DRAPES) ×2 IMPLANT
DERMABOND ADVANCED (GAUZE/BANDAGES/DRESSINGS) ×2
DERMABOND ADVANCED .7 DNX12 (GAUZE/BANDAGES/DRESSINGS) ×2 IMPLANT
DRAPE C-ARM 42X72 X-RAY (DRAPES) ×4 IMPLANT
DRAPE CHEST BREAST 15X10 FENES (DRAPES) ×4 IMPLANT
ELECT REM PT RETURN 9FT ADLT (ELECTROSURGICAL)
ELECTRODE REM PT RTRN 9FT ADLT (ELECTROSURGICAL) ×2 IMPLANT
GAUZE 4X4 16PLY RFD (DISPOSABLE) ×4 IMPLANT
GAUZE SPONGE 4X4 12PLY STRL (GAUZE/BANDAGES/DRESSINGS) ×4 IMPLANT
GLOVE BIO SURGEON STRL SZ 6.5 (GLOVE) ×1 IMPLANT
GLOVE BIO SURGEON STRL SZ7.5 (GLOVE) ×8 IMPLANT
GLOVE BIO SURGEONS STRL SZ 6.5 (GLOVE) ×1
GLOVE BIOGEL PI IND STRL 8 (GLOVE) ×2 IMPLANT
GLOVE BIOGEL PI INDICATOR 8 (GLOVE) ×2
GLOVE SURG SS PI 6.5 STRL IVOR (GLOVE) ×4 IMPLANT
GLOVE SURG UNDER POLY LF SZ6.5 (GLOVE) ×2 IMPLANT
GOWN STRL REUS W/ TWL LRG LVL3 (GOWN DISPOSABLE) ×6 IMPLANT
GOWN STRL REUS W/TWL LRG LVL3 (GOWN DISPOSABLE) ×20
KIT BASIN OR (CUSTOM PROCEDURE TRAY) ×4 IMPLANT
KIT PALINDROME-P 55CM (CATHETERS) IMPLANT
KIT TURNOVER KIT B (KITS) ×4 IMPLANT
NDL 18GX1X1/2 (RX/OR ONLY) (NEEDLE) ×2 IMPLANT
NDL HYPO 25GX1X1/2 BEV (NEEDLE) ×2 IMPLANT
NEEDLE 18GX1X1/2 (RX/OR ONLY) (NEEDLE) ×4 IMPLANT
NEEDLE HYPO 25GX1X1/2 BEV (NEEDLE) ×4 IMPLANT
NS IRRIG 1000ML POUR BTL (IV SOLUTION) ×4 IMPLANT
PACK CV ACCESS (CUSTOM PROCEDURE TRAY) ×4 IMPLANT
PACK SURGICAL SETUP 50X90 (CUSTOM PROCEDURE TRAY) ×4 IMPLANT
PAD ARMBOARD 7.5X6 YLW CONV (MISCELLANEOUS) ×8 IMPLANT
SET MICROPUNCTURE 5F STIFF (MISCELLANEOUS) ×2 IMPLANT
SPONGE LAP 18X18 RF (DISPOSABLE) ×2 IMPLANT
SPONGE SURGIFOAM ABS GEL 100 (HEMOSTASIS) IMPLANT
SUT ETHILON 3 0 PS 1 (SUTURE) ×10 IMPLANT
SUT PROLENE 5 0 C 1 24 (SUTURE) ×2 IMPLANT
SUT PROLENE 6 0 BV (SUTURE) IMPLANT
SUT SILK 0 TIES 10X30 (SUTURE) ×4 IMPLANT
SUT VIC AB 3-0 SH 27 (SUTURE) ×8
SUT VIC AB 3-0 SH 27X BRD (SUTURE) ×2 IMPLANT
SUT VICRYL 4-0 PS2 18IN ABS (SUTURE) ×6 IMPLANT
SWAB COLLECTION DEVICE MRSA (MISCELLANEOUS) ×2 IMPLANT
SWAB CULTURE ESWAB REG 1ML (MISCELLANEOUS) ×2 IMPLANT
SYR 10ML LL (SYRINGE) ×4 IMPLANT
SYR 20ML LL LF (SYRINGE) ×8 IMPLANT
SYR 5ML LL (SYRINGE) ×6 IMPLANT
SYR CONTROL 10ML LL (SYRINGE) ×4 IMPLANT
TOWEL GREEN STERILE (TOWEL DISPOSABLE) ×8 IMPLANT
TOWEL GREEN STERILE FF (TOWEL DISPOSABLE) ×4 IMPLANT
UNDERPAD 30X36 HEAVY ABSORB (UNDERPADS AND DIAPERS) ×4 IMPLANT
WATER STERILE IRR 1000ML POUR (IV SOLUTION) ×4 IMPLANT

## 2020-07-05 NOTE — Telephone Encounter (Signed)
Spoke to HD nurse Juanda Crumble and he is very concerned about the aneursymal segments on patient's fistula and the possibility of them rupturing. The patient currently has no open spots or scabs. He also says he thinks it is infected as it is red and slightly painful. Denies patient running a fever. The patient's [facility] is also malfunctioning and he has not had a tx since Thursday. He is sending patient to the ED for evaluation.

## 2020-07-05 NOTE — Anesthesia Preprocedure Evaluation (Addendum)
Anesthesia Evaluation  Patient identified by MRN, date of birth, ID band Patient awake    Reviewed: Allergy & Precautions, NPO status , Patient's Chart, lab work & pertinent test results, reviewed documented beta blocker date and time   History of Anesthesia Complications Negative for: history of anesthetic complications  Airway Mallampati: II  TM Distance: >3 FB Neck ROM: Full    Dental  (+) Dental Advisory Given   Pulmonary neg pulmonary ROS,    Pulmonary exam normal        Cardiovascular hypertension, Pt. on home beta blockers and Pt. on medications Normal cardiovascular exam     Neuro/Psych  Paraplegic spinal paralysis   CVA (left arm weakness, contracture), Residual Symptoms negative psych ROS   GI/Hepatic Neg liver ROS, GERD  Medicated and Controlled,  Endo/Other  diabetes (no meds), Type 2  Renal/GU ESRF and DialysisRenal disease     Musculoskeletal negative musculoskeletal ROS (+)   Abdominal   Peds  Hematology negative hematology ROS (+)   Anesthesia Other Findings Covid test negative   Reproductive/Obstetrics                           Anesthesia Physical Anesthesia Plan  ASA: IV  Anesthesia Plan: General   Post-op Pain Management:    Induction: Intravenous  PONV Risk Score and Plan: 2 and Treatment may vary due to age or medical condition and Ondansetron  Airway Management Planned: LMA  Additional Equipment: None  Intra-op Plan:   Post-operative Plan: Extubation in OR  Informed Consent: I have reviewed the patients History and Physical, chart, labs and discussed the procedure including the risks, benefits and alternatives for the proposed anesthesia with the patient or authorized representative who has indicated his/her understanding and acceptance.     Dental advisory given  Plan Discussed with: CRNA and Anesthesiologist  Anesthesia Plan Comments:         Anesthesia Quick Evaluation

## 2020-07-05 NOTE — Anesthesia Procedure Notes (Signed)
Procedure Name: LMA Insertion Date/Time: 07/05/2020 3:47 PM Performed by: Alain Marion, CRNA Pre-anesthesia Checklist: Patient identified, Emergency Drugs available, Suction available and Patient being monitored Patient Re-evaluated:Patient Re-evaluated prior to induction Oxygen Delivery Method: Circle System Utilized Preoxygenation: Pre-oxygenation with 100% oxygen Induction Type: IV induction LMA: LMA inserted LMA Size: 5.0 Number of attempts: 1 Airway Equipment and Method: Bite block Placement Confirmation: positive ETCO2 Tube secured with: Tape Dental Injury: Teeth and Oropharynx as per pre-operative assessment

## 2020-07-05 NOTE — Discharge Instructions (Signed)
[facility]  Discharge Instructions  Dialysis Access surgery  Please refer to the following instructions for your post-procedure care. Your surgeon or physician assistant will discuss any changes with you.  Activity  You may drive the day following your surgery, if you are comfortable and no longer taking prescription pain medication. Resume full activity as the soreness in your incision resolves.  Bathing/Showering  You may shower after you go home. Keep your incision dry for 48 hours. Do not soak in a bathtub, hot tub, or swim until the incision heals completely. You may not shower if you have a hemodialysis catheter.  Incision Care  Clean your incision with mild soap and water after 48 hours. Pat the area dry with a clean towel. You do not need a bandage unless otherwise instructed. Do not apply any ointments or creams to your incision. You may have skin glue on your incision. Do not peel it off. It will come off on its own in about one week. Your arm may swell a bit after surgery. To reduce swelling use pillows to elevate your arm so it is above your heart. Your doctor will tell you if you need to lightly wrap your arm with an ACE bandage.  Diet  Resume your normal diet. There are not special food restrictions following this procedure. In order to heal from your surgery, it is CRITICAL to get adequate nutrition. Your body requires vitamins, minerals, and protein. Vegetables are the best source of vitamins and minerals. Vegetables also provide the perfect balance of protein. Processed food has little nutritional value, so try to avoid this.  Medications  Resume taking all of your medications. If your incision is causing pain, you may take over-the counter pain relievers such as acetaminophen (Tylenol). If you were prescribed a stronger pain medication, please be aware these medications can cause nausea and constipation. Prevent nausea by taking the  medication with a snack or meal. Avoid constipation by drinking plenty of fluids and eating foods with high amount of fiber, such as fruits, vegetables, and grains.   Do not take Tylenol if you are taking prescription pain medications.  Follow up Your surgeon may want to see you in the office following your access surgery. If so, this will be arranged at the time of your surgery.  Please call us immediately for any of the following conditions:   Increased pain, redness, drainage (pus) from your incision site  Fever of 101 degrees or higher  Severe or worsening pain at your incision site  Hand pain or numbness.   Reduce your risk of vascular disease:   Stop smoking. If you would like help, call [facility] at 1-800-QUIT-NOW 517-658-3153) or [place] [facility] [place] your cholesterol  Maintain a desired weight  Control your diabetes  Keep your blood pressure down  Dialysis  It will take several weeks to several months for your new dialysis access to be ready for use. Your surgeon will determine when it is okay to use it. Your nephrologist will continue to direct your dialysis. You can continue to use your Permcath until your new access is ready for use.   07/05/2020 Bryce Miller [id number] 1960-09-11  Surgeon(s): Angelia Mould, MD  Procedure(s): LIGATION EXCISION  ARTERIOVENOUS GORTEX GRAFT INSERTION OF TUNNEL  DIALYSIS CATHETER USING 19CM PRECISION CHRONIC CATHETER   May stick graft immediately   May stick graft on designated area only:   X Do not stick right upper  arm     If you have any questions, please call the office at [phone].

## 2020-07-05 NOTE — ED Triage Notes (Signed)
Pt arrives with his brother who states he took patient to dialysis today but he was unable to be dialyzed due to his catheter not working, sent over for further evaluation. Last dialysis was Saturday. resp e/u, nad.

## 2020-07-05 NOTE — Transfer of Care (Signed)
Immediate Anesthesia Transfer of Care Note  Patient: Bryce Miller  Procedure(s) Performed: LIGATION EXCISION  ARTERIOVENOUS GORTEX GRAFT (Right ) INSERTION OF TUNNEL  DIALYSIS CATHETER USING 19CM PRECISION CHRONIC CATHETER (Right Neck)  Patient Location: PACU  Anesthesia Type:General  Level of Consciousness: awake, alert  and oriented  Airway & Oxygen Therapy: Patient Spontanous Breathing and Patient connected to face mask oxygen  Post-op Assessment: Report given to RN and Post -op Vital signs reviewed and stable  Post vital signs: Reviewed and stable  Last Vitals:  Vitals Value Taken Time  BP 155/114 07/05/20 1812  Temp    Pulse 86 07/05/20 1815  Resp 11 07/05/20 1815  SpO2 100 % 07/05/20 1815  Vitals shown include unvalidated device data.  Last Pain:  Vitals:   07/05/20 1350  TempSrc: Oral  PainSc:          Complications: No complications documented.

## 2020-07-05 NOTE — ED Provider Notes (Signed)
[facility] EMERGENCY DEPARTMENT Provider Note   CSN: [id number] Arrival date & time: 07/05/20  1101     History Chief Complaint  Patient presents with  . Vascular Access Problem    Bryce Miller is a 59 y.o. male with a past medical history of end-stage renal disease on dialysis Tuesday Thursday Saturday, history of diabetes and hypertension, history of stroke with spastic left-sided hemiplegia.  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  Patient's last full dialysis was this past Saturday.  He has a left sided dialysis catheter in the chest wall which is apparently not functioning and his dialysis center was unable to perform dialysis today.  The patient previously underwent a fistulogram which showed stenosis with some outlet obstruction, a tortuous right-sided AV fistula.  His brother who gives most of the history says that he has noticed that his been scabbing and oozing lately.  The patient states that it is extremely painful.  He denies fevers or chills. HPI     Past Medical History:  Diagnosis Date  . Chronic kidney disease    functioning at 18%  . Constipation   . Diabetes mellitus without complication ([place])    "borderline"  . GERD (gastroesophageal reflux disease)   . Hypertension   . Paraplegic spinal paralysis ([place])   . Stroke [facility])    left side weakness    Patient Active Problem List   Diagnosis Date Noted  . ESRD (end stage renal disease) ([place]) 11/19/2017  . Fever 11/19/2017  . Paraplegic spinal paralysis ([place]) 11/19/2017  . Sepsis ([place]) 11/19/2017  . Chronic kidney disease (CKD), stage IV (severe) ([place]) 01/19/2016  . Colon cancer screening     Past Surgical History:  Procedure Laterality Date  . A/V FISTULAGRAM N/A 05/09/2020   Procedure: A/V FISTULAGRAM;  Surgeon: Waynetta Sandy, MD;  Location: [facility] CV LAB;  Service: Cardiovascular;   Laterality: N/A;  . A/V SHUNTOGRAM Right 03/21/2018   Procedure: A/V SHUNTOGRAM;  Surgeon: Elam Dutch, MD;  Location: [place] CV LAB;  Service: Cardiovascular;  Laterality: Right;  . AV FISTULA PLACEMENT Right 12/06/2015   Procedure: ARTERIOVENOUS (AV) FISTULA CREATION-RIGHT upper arm ;  Surgeon: Conrad Weingarten, MD;  Location: [facility];  Service: Vascular;  Laterality: Right;  . COLONOSCOPY N/A 09/30/2014   Procedure: COLONOSCOPY;  Surgeon: Daneil Dolin, MD;  [facility];  Service: Endoscopy;  Laterality: N/A;  2:30 AM - moved to 1:10 - Ginger to notify pt  . CSF SHUNT    . IR DIALY SHUNT INTRO NEEDLE/INTRACATH INITIAL W/IMG LEFT Left 06/01/2020  . IR FLUORO GUIDE CV LINE RIGHT  06/01/2020  . PERIPHERAL VASCULAR BALLOON ANGIOPLASTY Right 03/21/2018   Procedure: PERIPHERAL VASCULAR BALLOON ANGIOPLASTY;  Surgeon: Elam Dutch, MD;  Location: [facility] CV LAB;  Service: Cardiovascular;  Laterality: Right;  AV GRAFT  . PERIPHERAL VASCULAR INTERVENTION Right 05/09/2020   Procedure: PERIPHERAL VASCULAR INTERVENTION;  Surgeon: Waynetta Sandy, MD;  Location: [facility] CV LAB;  Service: Cardiovascular;  Laterality: Right;       Family History  Problem Relation Age of Onset  . Diabetes Mother     Social History   Tobacco Use  . Smoking status: Never Smoker  . Smokeless tobacco: Never Used  Vaping Use  . Vaping Use: Never used  Substance Use Topics  . Alcohol use: No  . Drug use: Not Currently  Types: Cocaine    Comment: none since 2012    Home Medications Prior to Admission medications   Medication Sig Start Date End Date Taking? Authorizing Provider  Cholecalciferol (VITAMIN D) 2000 units CAPS Take 2,000 Units by mouth daily.     [provider]  cholecalciferol (VITAMIN D) 25 MCG (1000 UNIT) tablet Take 1,000 Units by mouth daily.    [provider]  furosemide (LASIX) 40 MG tablet Take 40 mg by mouth daily.     [provider]  hydrALAZINE (APRESOLINE) 50 MG tablet Take 50 mg by mouth 3 (three) times daily.     [provider]  metoprolol (LOPRESSOR) 50 MG tablet Take 25 mg by mouth daily.     [provider]  omeprazole (PRILOSEC) 20 MG capsule Take 20 mg by mouth daily.    [provider]  sevelamer carbonate (RENVELA) 800 MG tablet Take 800-1,600 mg by mouth 3 (three) times daily with meals.    [provider]    Allergies    Patient has no known allergies.  Review of Systems   Review of Systems Ten systems reviewed and are negative for acute change, except as noted in the HPI.   Physical Exam Updated Vital Signs BP 130/85   Pulse 85   Temp 98.5 F (36.9 C)   Resp 16   SpO2 100%   Physical Exam Vitals and nursing note reviewed.  Constitutional:      General: He is not in acute distress.    Appearance: He is well-developed. He is not diaphoretic.  HENT:     Head: Normocephalic and atraumatic.  Eyes:     General: No scleral icterus.    Extraocular Movements: Extraocular movements intact.     Conjunctiva/sclera: Conjunctivae normal.     Pupils: Pupils are equal, round, and reactive to light.  Cardiovascular:     Rate and Rhythm: Normal rate and regular rhythm.     Heart sounds: Normal heart sounds.     Comments: RUE av fistula bulbous. No palpable thrill.  There is scabbing and serous drainage.  It is erythematous tense and exquisitely tender to palpation. Tunneled dialysis catheter present in the left upper chest wall Pulmonary:     Effort: Pulmonary effort is normal. No respiratory distress.     Breath sounds: Normal breath sounds.  Abdominal:     Palpations: Abdomen is soft.     Tenderness: There is no abdominal tenderness.  Musculoskeletal:     Cervical back: Normal range of motion and neck supple.  Skin:    General: Skin is warm and dry.  Neurological:     Mental Status: He is alert.  Psychiatric:        Behavior: Behavior normal.      ED Results / Procedures / Treatments   Labs (all labs ordered are listed, but only abnormal results are displayed) Labs Reviewed  RESP PANEL BY RT-PCR (FLU A&B, COVID) ARPGX2  BASIC METABOLIC PANEL  CBC WITH DIFFERENTIAL/PLATELET    EKG None  Radiology No results found.  Procedures Procedures (including critical care time)  Medications Ordered in ED Medications - No data to display  ED Course  I have reviewed the triage vital signs and the nursing notes.  Pertinent labs & imaging results that were available during my care of the patient were reviewed by me and considered in my medical decision making (see chart for details).    MDM Rules/Calculators/A&P  Patient seen in the emergency department. Consult provided by Dr. Doren Custard of vascular surgery who plans to take him for ligation procedure. Plan for discharge after procedure. I ordered and reviewed the patient's labs which shows chronic anemia, normal potassium level. BUN elevated to 90 consistent with end-stage renal disease. Covid swabs are negative. EKG without evidence of T wave elevation. Final Clinical Impression(s) / ED Diagnoses Final diagnoses:  None    Rx / DC Orders ED Discharge Orders    None       Margarita Mail, PA-C 07/05/20 1502    Dorie Rank, MD 07/07/20 [phone]

## 2020-07-05 NOTE — Op Note (Signed)
NAME: Bryce Miller    MRN: [id number] DOB: 1960/12/07    DATE OF OPERATION: 07/05/2020  PREOP DIAGNOSIS:    Infected, aneurysmal right upper arm fistula and nonfunctioning left IJ tunneled dialysis catheter  POSTOP DIAGNOSIS:    Same  PROCEDURE:    1.  Ultrasound-guided placement of right IJ tunneled dialysis catheter (19 cm) 2.  Removal of left IJ tunneled dialysis catheter 3.  Excision of infected right brachiocephalic AV fistula and ligation of fistula  SURGEON: Judeth Cornfield. Scot Dock, MD  ASSIST: Karoline Caldwell, PA  ANESTHESIA: General  EBL: Minimal  INDICATIONS:    Bryce Miller is a 59 y.o. male with a aneurysmal right upper arm fistula that had become infected and was at risk for bleeding.  He was sent from the dialysis center to the emergency department and is brought to the operating room to excise the infected area and ligate the fistula.  In addition the tunneled dialysis catheter was not functioning and therefore he needed a new catheter.  FINDINGS:   Infected fistula.  Cultures were sent.  TECHNIQUE:   The patient was taken to the operating room and received a general anesthetic.  The neck and upper chest were prepped and draped in usual sterile fashion.  Under ultrasound guidance, after the skin was anesthetized, I cannulated the right IJ which was patent by ultrasound.  A real-time image was sent to the server.  The vein was cannulated with a micropuncture needle and a micropuncture sheath introduced over wire.  I then advanced the J-wire into the right atrium.  The exit site for the cath was selected and a 19 cm catheter brought through the tunnel.  The tract over the wire was dilated and then the dilator and peel-away sheath advanced over the wire and the wire and dilator removed.  The 19 cm catheter was passed through the peel-away sheath and positioned at the cavoatrial junction.  Both ports withdrew easily.  The catheter was secured at its exit site with  3-0 nylon suture.  The IJ cannulation site was closed with a 4-0 subcuticular stitch.  This was all done under fluoroscopy.  I then removed the right IJ catheter after the cuff was bluntly dissected free.  Pressure was held for hemostasis.  I confirmed that the new catheter had not changed positions after removing the old catheter.  Sterile dressing was applied and then attention was turned to the right arm.  The right arm was prepped and draped in usual sterile fashion.  A fishmouth incision was made encompassing the aneurysm at the proximal anastomosis.  Here the fistula was dissected free.  I clamped just above the anastomosis and divided the vein.  This was oversewn with a 5-0 Prolene suture.  I then excised this aneurysm.  Next the infected area was anesthetized and a large elliptical incision made encompassing all this infection.  There was some purulence which was cultured.  I excised this entire aneurysmal segment.  The vein was ligated distally.  A large amount of clot was retrieved from the central portion of the fistula.  Hemostasis was obtained in the wound.  This incision was closed with interrupted 3-0 nylon's.  The incision at the antecubital level was closed with a deep layer of 3-0 Vicryl and the skin closed with 4-0 Vicryl.  Dermabond was applied.  Sterile dressing was applied.  The patient tolerated the procedure well was transferred to the recovery room in stable condition.  All needle and sponge counts  were correct.  Given the complexity of the case a first assistant was necessary in order to expedient the procedure and safely perform the technical aspects of the operation.  Deitra Mayo, MD, FACS [facility]  DATE OF DICTATION:   07/05/2020

## 2020-07-05 NOTE — H&P (Signed)
REASON FOR CONSULT:    Aneurysmal right upper arm fistula at risk for bleeding.  The consult is requested by the dialysis center in [place].  ASSESSMENT & PLAN:   ANEURYSMAL RIGHT UPPER ARM FISTULA: This patient was sent from the dialysis center today with an aneurysmal right upper arm fistula with skin breakdown.  The patient was felt to be at very high risk for rupture and bleeding and was sent urgently to the emergency department.  The son also tells me that his dialysis catheter did not work today.  The patient dialyzes on Tuesdays Thursdays and Saturdays and last dialyzed Saturday.  Given the risk for bleeding I have recommended ligation of his right arm fistula and excision of the area of concern.  In addition we will have to place a new dialysis catheter as reportedly his current catheter is not working well.  I have discussed the indications for the procedure and potential complications with the patient and his brother and they are agreeable to proceed.  He ate at 7:15 AM.  Of note he was scheduled for a for stage basilic vein transposition or right arm AV graft in early December.  However I think we need to be sure that he is recovered from today surgery before we can reevaluate him for proceeding with new access in the right arm.  He has a contraction in the left arm and is not felt to be a candidate for access in the left arm.  The other consideration would be a thigh graft in the future.   Deitra Mayo, MD Office: [phone]   HPI:   Bryce Miller is a pleasant 59 y.o. male, who dialyzes on Tuesdays Thursdays and Saturdays.  He last dialyzed Saturday.  He went for dialysis today and had a large aneurysmal fistula with compromise of the overlying skin that was felt to be at risk for bleeding.  He was sent by the dialysis center urgently to the [facility] emergency department for further evaluation.  The brother also reports that his dialysis catheter did not work today so he  did not receive dialysis today.  To the best of their understanding he is supposed to return for dialysis on Thursday.  He dialyzes at [facility] in [place].  Of note the patient was last seen in our office by Karoline Caldwell, PA.  The patient's access history in the right upper arm is well documented in her note.  He has had multiple interventions and based on my review of this history I do not think this fistula is salvageable.  He had been scheduled for a for stage basilic vein transposition of the right arm versus an AV graft however given the infection and compromise of the skin I think this would have to be done only after he is recovered from his planned procedure today.  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  Past Medical History:  Diagnosis Date  . Chronic kidney disease    functioning at 18%  . Constipation   . Diabetes mellitus without complication ([place])    "borderline"  . GERD (gastroesophageal reflux disease)   . Hypertension   . 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 Stroke [facility])    left side weakness    Family History  Problem Relation Age of Onset  . Diabetes Mother     SOCIAL HISTORY: Social History   Socioeconomic History  .  Marital status: Divorced    Spouse name: Not on file  . Number of children: Not on file  . Years of education: Not on file  . Highest education level: Not on file  Occupational History  . Not on file  Tobacco Use  . Smoking status: Never Smoker  . Smokeless tobacco: Never Used  Vaping Use  . Vaping Use: Never used  Substance and Sexual Activity  . Alcohol use: No  . Drug use: Not Currently    Types: Cocaine    Comment: none since 2012  . Sexual activity: Not Currently  Other Topics Concern  . Not on file  Social History Narrative  . Not on file   Social Determinants of Health   Financial Resource Strain:   . Difficulty of Paying  Living Expenses: Not on file  Food Insecurity:   . Worried About Charity fundraiser in the Last Year: Not on file  . Ran Out of Food in the Last Year: Not on file  Transportation Needs:   . Lack of Transportation (Medical): Not on file  . Lack of Transportation (Non-Medical): Not on file  Physical Activity:   . Days of Exercise per Week: Not on file  . Minutes of Exercise per Session: Not on file  Stress:   . Feeling of Stress : Not on file  Social Connections:   . Frequency of Communication with Friends and Family: Not on file  . Frequency of Social Gatherings with Friends and Family: Not on file  . Attends Religious Services: Not on file  . Active Member of Clubs or Organizations: Not on file  . Attends Archivist Meetings: Not on file  . Marital Status: Not on file  Intimate Partner Violence:   . Fear of Current or Ex-Partner: Not on file  . Emotionally Abused: Not on file  . Physically Abused: Not on file  . Sexually Abused: Not on file    No Known Allergies  Current Facility-Administered Medications  Medication Dose Route Frequency Provider Last Rate Last Admin  . 0.9 %  sodium chloride infusion  250 mL Intravenous PRN Waynetta Sandy, MD      . fentaNYL (SUBLIMAZE) injection 50 mcg  50 mcg Intravenous Once Harris, Abigail, PA-C      . ondansetron (ZOFRAN) injection 4 mg  4 mg Intravenous Once Margarita Mail, PA-C       Current Outpatient Medications  Medication Sig Dispense Refill  . Cholecalciferol (VITAMIN D) 2000 units CAPS Take 2,000 Units by mouth daily.     . cholecalciferol (VITAMIN D) 25 MCG (1000 UNIT) tablet Take 1,000 Units by mouth daily.    . furosemide (LASIX) 40 MG tablet Take 40 mg by mouth daily.     . hydrALAZINE (APRESOLINE) 50 MG tablet Take 50 mg by mouth 3 (three) times daily.     . metoprolol (LOPRESSOR) 50 MG tablet Take 25 mg by mouth daily.     Marland Kitchen omeprazole (PRILOSEC) 20 MG capsule Take 20 mg by mouth daily.    .  sevelamer carbonate (RENVELA) 800 MG tablet Take 800-1,600 mg by mouth 3 (three) times daily with meals.      REVIEW OF SYSTEMS:  [X]  denotes positive finding, [ ]  denotes negative finding Cardiac  Comments:  Chest pain or chest pressure:    Shortness of breath upon exertion:    Short of breath when lying flat:    Irregular heart rhythm:  Vascular    Pain in calf, thigh, or hip brought on by ambulation:    Pain in feet at night that wakes you up from your sleep:     Blood clot in your veins:    Leg swelling:         Pulmonary    Oxygen at home:    Productive cough:     Wheezing:         Neurologic    Sudden weakness in arms or legs:     Sudden numbness in arms or legs:     Sudden onset of difficulty speaking or slurred speech:    Temporary loss of vision in one eye:     Problems with dizziness:         Gastrointestinal    Blood in stool:     Vomited blood:         Genitourinary    Burning when urinating:     Blood in urine:        Psychiatric    Major depression:         Hematologic    Bleeding problems:    Problems with blood clotting too easily:        Skin    Rashes or ulcers:        Constitutional    Fever or chills:     PHYSICAL EXAM:   Vitals:   07/05/20 1142 07/05/20 1230  BP: 130/85 (!) 163/119  Pulse: 85 95  Resp: 16 17  Temp: 98.5 F (36.9 C) 98.2 F (36.8 C)  TempSrc:  Oral  SpO2: 100% 98%    GENERAL: The patient is a well-nourished male, in no acute distress. The vital signs are documented above. CARDIAC: There is a regular rate and rhythm.  VASCULAR: He has a palpable right radial pulse. His fistula is pulsatile and aneurysmal as documented below.  There is a large area that appears to be infected with compromised skin.  This is documented in the photograph below.    PULMONARY: There is good air exchange bilaterally without wheezing or rales. ABDOMEN: Soft and non-tender with normal pitched bowel sounds.  MUSCULOSKELETAL:  There are no major deformities or cyanosis.  DATA:    LABS: pend  COVID: pend

## 2020-07-06 ENCOUNTER — Inpatient Hospital Stay (HOSPITAL_COMMUNITY): Admission: RE | Admit: 2020-07-06 | Payer: Medicaid Other | Source: Ambulatory Visit

## 2020-07-06 ENCOUNTER — Encounter (HOSPITAL_COMMUNITY): Payer: Self-pay | Admitting: Vascular Surgery

## 2020-07-06 NOTE — Anesthesia Postprocedure Evaluation (Signed)
Anesthesia Post Note  Patient: Bryce Miller  Procedure(s) Performed: LIGATION EXCISION  ARTERIOVENOUS GORTEX GRAFT (Right ) INSERTION OF TUNNEL  DIALYSIS CATHETER USING 19CM PRECISION CHRONIC CATHETER (Right Neck)     Patient location during evaluation: PACU Anesthesia Type: General Level of consciousness: awake and alert Pain management: pain level controlled Vital Signs Assessment: post-procedure vital signs reviewed and stable Respiratory status: spontaneous breathing, nonlabored ventilation, respiratory function stable and patient connected to nasal cannula oxygen Cardiovascular status: blood pressure returned to baseline and stable Postop Assessment: no apparent nausea or vomiting Anesthetic complications: no   No complications documented.  Last Vitals:  Vitals:   07/05/20 1915 07/05/20 1930  BP: (!) 158/106 (!) 149/92  Pulse: 73 73  Resp: (!) 8 11  Temp:  36.5 C  SpO2: 95% 98%    Last Pain:  Vitals:   07/05/20 1930  TempSrc:   PainSc: 0-No pain                 Tyjah Hai S

## 2020-07-09 ENCOUNTER — Other Ambulatory Visit (HOSPITAL_COMMUNITY)
Admission: RE | Admit: 2020-07-09 | Discharge: 2020-07-09 | Disposition: A | Discharge status: Home / Self Care | Payer: Medicaid Other | Source: Ambulatory Visit | Attending: Vascular Surgery | Admitting: Vascular Surgery

## 2020-07-09 DIAGNOSIS — Z20822 Contact with and (suspected) exposure to covid-19: Secondary | ICD-10-CM | POA: Diagnosis not present

## 2020-07-09 DIAGNOSIS — Z01812 Encounter for preprocedural laboratory examination: Secondary | ICD-10-CM | POA: Insufficient documentation

## 2020-07-09 LAB — SARS CORONAVIRUS 2 (TAT 6-24 HRS): SARS Coronavirus 2: NEGATIVE

## 2020-07-10 LAB — AEROBIC/ANAEROBIC CULTURE W GRAM STAIN (SURGICAL/DEEP WOUND)
Culture: NO GROWTH
Gram Stain: NONE SEEN

## 2020-07-11 ENCOUNTER — Telehealth: Payer: Self-pay | Admitting: *Deleted

## 2020-07-11 NOTE — Telephone Encounter (Signed)
Pt had ligation/excision of right AVG on 11/30. Scheduled for right UE bascilic vein fistula on 10/3. Pts wife called to see if surgery on 12/8 is still necessary. It is not, and has been cancelled. Pt is scheduled to see Dr. Scot Dock on 12/15 to discuss further access.

## 2020-07-13 ENCOUNTER — Ambulatory Visit (HOSPITAL_COMMUNITY): Admission: RE | Admit: 2020-07-13 | Payer: Medicaid Other | Source: Home / Self Care | Admitting: Vascular Surgery

## 2020-07-13 ENCOUNTER — Encounter (HOSPITAL_COMMUNITY): Admission: RE | Payer: Self-pay | Source: Home / Self Care

## 2020-07-13 SURGERY — ARTERIOVENOUS (AV) FISTULA CREATION
Anesthesia: Monitor Anesthesia Care | Laterality: Right

## 2020-07-20 ENCOUNTER — Other Ambulatory Visit: Payer: Self-pay

## 2020-07-20 ENCOUNTER — Encounter: Payer: Self-pay | Admitting: Vascular Surgery

## 2020-07-20 ENCOUNTER — Ambulatory Visit (INDEPENDENT_AMBULATORY_CARE_PROVIDER_SITE_OTHER): Payer: Self-pay | Admitting: Vascular Surgery

## 2020-07-20 VITALS — BP 182/119 | HR 88 | Temp 98.3°F | Resp 20 | Ht 72.0 in | Wt 240.0 lb

## 2020-07-20 DIAGNOSIS — N186 End stage renal disease: Secondary | ICD-10-CM

## 2020-07-20 DIAGNOSIS — Z992 Dependence on renal dialysis: Secondary | ICD-10-CM

## 2020-07-20 NOTE — Progress Notes (Signed)
error 

## 2020-07-20 NOTE — Progress Notes (Signed)
REASON FOR VISIT:   Follow-up after removal of infected right AV fistula  MEDICAL ISSUES:   END-STAGE RENAL DISEASE: His right arm continues to heal after removal of an infected right upper arm fistula.  I removed his sutures in the office today.  He still fairly sore and is not ready to proceed with new access.  Based on his duplex scan it looks like you might be a candidate for a basilic vein transposition.  I have explained that we would do this in 2 stages.  If the vein was not adequate we would place an AV graft.  Given the contracture in the left arm I do not think he can have access there.  We will give him some more time for the his right arm to heal before scheduling surgery.  He dialyzes on Tuesdays Thursdays and Saturdays so we will schedule this on a Monday in mid January.   HPI:   Bryce Miller is a pleasant 59 y.o. male who had presented with an aneurysmal right upper arm fistula that had become infected and was at risk for bleeding.  He was sent from the dialysis center to the emergency department and was brought to the operating room to excise the infected area and ligate the fistula.  In addition he had a tunneled dialysis catheter that was nonfunctioning.  On 07/05/2020 he underwent ultrasound-guided placement of a right IJ tunneled dialysis catheter, removal of his left IJ catheter, and excision of an infected right brachiocephalic fistula with ligation of the fistula.   The patient has had a stroke and is significantly disabled.  History is obtained from his brother.   Past Medical History:  Diagnosis Date  . Chronic kidney disease    functioning at 18%  . Constipation   . Diabetes mellitus without complication ([place])    "borderline"  . GERD (gastroesophageal reflux disease)   . Hypertension   . 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 Stroke [facility])    left side weakness    Family History  Problem Relation Age of Onset  . Diabetes Mother     SOCIAL  HISTORY: Social History   Tobacco Use  . Smoking status: Never Smoker  . Smokeless tobacco: Never Used  Substance Use Topics  . Alcohol use: No    No Known Allergies  Current Outpatient Medications  Medication Sig Dispense Refill  . furosemide (LASIX) 40 MG tablet Take 40 mg by mouth daily.     . hydrALAZINE (APRESOLINE) 50 MG tablet Take 50 mg by mouth 3 (three) times daily.     . metoprolol tartrate (LOPRESSOR) 25 MG tablet Take 25 mg by mouth daily.     Marland Kitchen omeprazole (PRILOSEC) 20 MG capsule Take 20 mg by mouth daily.    . sevelamer carbonate (RENVELA) 800 MG tablet Take 800-1,600 mg by mouth See admin instructions. 1600 mg with meals, 800 mg with snacks    . oxyCODONE-acetaminophen (PERCOCET) 5-325 MG tablet Take 1 tablet by mouth every 4 (four) hours as needed for severe pain. (Patient not taking: No sig reported) 20 tablet 0   Current Facility-Administered Medications  Medication Dose Route Frequency Provider Last Rate Last Admin  . 0.9 %  sodium chloride infusion  250 mL Intravenous PRN Waynetta Sandy, MD        REVIEW OF SYSTEMS:  [X]  denotes positive finding, [ ]  denotes negative finding Cardiac  Comments:  Chest pain or chest pressure:    Shortness of breath  upon exertion:    Short of breath when lying flat:    Irregular heart rhythm:        Vascular    Pain in calf, thigh, or hip brought on by ambulation:    Pain in feet at night that wakes you up from your sleep:     Blood clot in your veins:    Leg swelling:         Pulmonary    Oxygen at home:    Productive cough:     Wheezing:         Neurologic    Sudden weakness in arms or legs:     Sudden numbness in arms or legs:     Sudden onset of difficulty speaking or slurred speech:    Temporary loss of vision in one eye:     Problems with dizziness:         Gastrointestinal    Blood in stool:     Vomited blood:         Genitourinary    Burning when urinating:     Blood in urine:         Psychiatric    Major depression:         Hematologic    Bleeding problems:    Problems with blood clotting too easily:        Skin    Rashes or ulcers:        Constitutional    Fever or chills:     PHYSICAL EXAM:   Vitals:   07/20/20 0929  BP: (!) 182/119  Pulse: 88  Resp: 20  Temp: 98.3 F (36.8 C)  Weight: 240 lb (108.9 kg)  Height: 6' (1.829 m)    GENERAL: The patient is a well-nourished male, in no acute distress. The vital signs are documented above. CARDIAC: There is a regular rate and rhythm.  VASCULAR: He has a palpable right radial pulse. PULMONARY: There is good air exchange bilaterally without wheezing or rales. NEUROLOGIC: He has a contracture in his left arm from the stroke. SKIN: There are no ulcers or rashes noted. PSYCHIATRIC: The patient has a normal affect.  DATA:    DUPLEX RIGHT ARM: He did have a duplex of his right basilic vein on 48/08/6551 that showed that the diameters of the vein ranged from 0.6-0.63 cm.  Deitra Mayo [facility] [phone]

## 2020-08-18 ENCOUNTER — Other Ambulatory Visit: Payer: Self-pay

## 2020-08-18 ENCOUNTER — Encounter (HOSPITAL_COMMUNITY): Payer: Self-pay | Admitting: Vascular Surgery

## 2020-08-19 ENCOUNTER — Telehealth: Payer: Self-pay

## 2020-08-19 NOTE — Telephone Encounter (Signed)
Pt's nephew Lennyn Bellanca requested to reschedule surgery on 08/22/20 due to possible inclement weather. Pt rescheduled for 08/29/20 with 0530 arrival time. Covid test rescheduled for 08/27/20. Nephew Terry verbalized understanding.

## 2020-08-20 ENCOUNTER — Other Ambulatory Visit (HOSPITAL_COMMUNITY): Payer: Medicaid Other

## 2020-08-25 ENCOUNTER — Other Ambulatory Visit: Payer: Self-pay

## 2020-08-25 ENCOUNTER — Encounter (HOSPITAL_COMMUNITY): Payer: Self-pay | Admitting: Vascular Surgery

## 2020-08-25 NOTE — Progress Notes (Signed)
I spoke with Cariolyn, I asked to speak to Yates Decamp to get permission to speak to Hoyle Sauer- sister or Tyrell Swanton - nephew.  Jaylan gave me permission, I instructed patient and sister that wh patient is checked in on Monday, to ask for what you need to sign,giving staff to speak with family members.  Mr Eliseo Squires denies chest pain or shortness of breath.Patient has not been exposed to anyone with s/s of Covid. Mr. Gaither is scheduled to be tested on Saturday.

## 2020-08-27 ENCOUNTER — Other Ambulatory Visit (HOSPITAL_COMMUNITY)
Admission: RE | Admit: 2020-08-27 | Discharge: 2020-08-27 | Disposition: A | Discharge status: Home / Self Care | Payer: Medicaid Other | Source: Ambulatory Visit | Attending: Vascular Surgery | Admitting: Vascular Surgery

## 2020-08-27 DIAGNOSIS — Z20822 Contact with and (suspected) exposure to covid-19: Secondary | ICD-10-CM | POA: Insufficient documentation

## 2020-08-27 DIAGNOSIS — Z01812 Encounter for preprocedural laboratory examination: Secondary | ICD-10-CM | POA: Insufficient documentation

## 2020-08-27 LAB — SARS CORONAVIRUS 2 (TAT 6-24 HRS): SARS Coronavirus 2: NEGATIVE

## 2020-08-29 ENCOUNTER — Ambulatory Visit (HOSPITAL_COMMUNITY)
Admission: RE | Admit: 2020-08-29 | Discharge: 2020-08-29 | Disposition: A | Discharge status: Home / Self Care | Payer: Medicaid Other | Attending: Vascular Surgery | Admitting: Vascular Surgery

## 2020-08-29 ENCOUNTER — Ambulatory Visit (HOSPITAL_COMMUNITY): Payer: Medicaid Other | Admitting: Certified Registered Nurse Anesthetist

## 2020-08-29 ENCOUNTER — Other Ambulatory Visit: Payer: Self-pay

## 2020-08-29 ENCOUNTER — Encounter (HOSPITAL_COMMUNITY): Payer: Self-pay | Admitting: Vascular Surgery

## 2020-08-29 ENCOUNTER — Encounter (HOSPITAL_COMMUNITY)
Admission: RE | Disposition: A | Discharge status: Home / Self Care | Payer: Self-pay | Source: Home / Self Care | Attending: Vascular Surgery

## 2020-08-29 DIAGNOSIS — G8220 Paraplegia, unspecified: Secondary | ICD-10-CM | POA: Insufficient documentation

## 2020-08-29 DIAGNOSIS — N186 End stage renal disease: Secondary | ICD-10-CM | POA: Diagnosis not present

## 2020-08-29 DIAGNOSIS — Z992 Dependence on renal dialysis: Secondary | ICD-10-CM | POA: Diagnosis not present

## 2020-08-29 DIAGNOSIS — I120 Hypertensive chronic kidney disease with stage 5 chronic kidney disease or end stage renal disease: Secondary | ICD-10-CM | POA: Insufficient documentation

## 2020-08-29 DIAGNOSIS — N185 Chronic kidney disease, stage 5: Secondary | ICD-10-CM | POA: Diagnosis not present

## 2020-08-29 DIAGNOSIS — Z79899 Other long term (current) drug therapy: Secondary | ICD-10-CM | POA: Diagnosis not present

## 2020-08-29 DIAGNOSIS — Z8673 Personal history of transient ischemic attack (TIA), and cerebral infarction without residual deficits: Secondary | ICD-10-CM | POA: Diagnosis not present

## 2020-08-29 DIAGNOSIS — Z833 Family history of diabetes mellitus: Secondary | ICD-10-CM | POA: Insufficient documentation

## 2020-08-29 DIAGNOSIS — E1122 Type 2 diabetes mellitus with diabetic chronic kidney disease: Secondary | ICD-10-CM | POA: Insufficient documentation

## 2020-08-29 HISTORY — DX: End stage renal disease: N18.6

## 2020-08-29 HISTORY — DX: Unspecified convulsions: R56.9

## 2020-08-29 HISTORY — PX: BASCILIC VEIN TRANSPOSITION: SHX5742

## 2020-08-29 HISTORY — DX: Prediabetes: R73.03

## 2020-08-29 LAB — POCT I-STAT, CHEM 8
BUN: 63 mg/dL — ABNORMAL HIGH (ref 6–20)
Calcium, Ion: 1.16 mmol/L (ref 1.15–1.40)
Chloride: 105 mmol/L (ref 98–111)
Creatinine, Ser: 13 mg/dL — ABNORMAL HIGH (ref 0.61–1.24)
Glucose, Bld: 89 mg/dL (ref 70–99)
HCT: 44 % (ref 39.0–52.0)
Hemoglobin: 15 g/dL (ref 13.0–17.0)
Potassium: 4.3 mmol/L (ref 3.5–5.1)
Sodium: 141 mmol/L (ref 135–145)
TCO2: 24 mmol/L (ref 22–32)

## 2020-08-29 SURGERY — TRANSPOSITION, VEIN, BASILIC
Anesthesia: Monitor Anesthesia Care | Laterality: Right

## 2020-08-29 MED ORDER — PROTAMINE SULFATE 10 MG/ML IV SOLN
INTRAVENOUS | Status: DC | PRN
  Administered 2020-08-29: 50 mg via INTRAVENOUS

## 2020-08-29 MED ORDER — SODIUM CHLORIDE 0.9 % IV SOLN
INTRAVENOUS | Status: DC | PRN
  Administered 2020-08-29: 08:00:00 500 mL

## 2020-08-29 MED ORDER — PROPOFOL 10 MG/ML IV BOLUS
INTRAVENOUS | Status: AC
  Filled 2020-08-29: qty 20

## 2020-08-29 MED ORDER — METOPROLOL TARTRATE 25 MG PO TABS
25.0000 mg | ORAL_TABLET | Freq: Once | ORAL | Status: AC
  Filled 2020-08-29: qty 1

## 2020-08-29 MED ORDER — MIDAZOLAM HCL 5 MG/5ML IJ SOLN
INTRAMUSCULAR | Status: DC | PRN
  Administered 2020-08-29: 1 mg via INTRAVENOUS

## 2020-08-29 MED ORDER — LIDOCAINE HCL (PF) 1 % IJ SOLN
INTRAMUSCULAR | Status: AC
  Filled 2020-08-29: qty 30

## 2020-08-29 MED ORDER — CHLORHEXIDINE GLUCONATE 0.12 % MT SOLN
15.0000 mL | Freq: Once | OROMUCOSAL | Status: AC
  Administered 2020-08-29: 15 mL via OROMUCOSAL
  Filled 2020-08-29: qty 15

## 2020-08-29 MED ORDER — METOPROLOL TARTRATE 12.5 MG HALF TABLET
ORAL_TABLET | ORAL | Status: AC
  Administered 2020-08-29: 25 mg via ORAL
  Filled 2020-08-29: qty 2

## 2020-08-29 MED ORDER — DIPHENHYDRAMINE HCL 50 MG/ML IJ SOLN
INTRAMUSCULAR | Status: AC
  Filled 2020-08-29: qty 1

## 2020-08-29 MED ORDER — FENTANYL CITRATE (PF) 250 MCG/5ML IJ SOLN
INTRAMUSCULAR | Status: DC | PRN
  Administered 2020-08-29: 25 ug via INTRAVENOUS

## 2020-08-29 MED ORDER — PROPOFOL 500 MG/50ML IV EMUL
INTRAVENOUS | Status: DC | PRN
  Administered 2020-08-29: 75 ug/kg/min via INTRAVENOUS

## 2020-08-29 MED ORDER — LIDOCAINE 2% (20 MG/ML) 5 ML SYRINGE
INTRAMUSCULAR | Status: AC
  Filled 2020-08-29: qty 5

## 2020-08-29 MED ORDER — FENTANYL CITRATE (PF) 250 MCG/5ML IJ SOLN
INTRAMUSCULAR | Status: AC
  Filled 2020-08-29: qty 5

## 2020-08-29 MED ORDER — LIDOCAINE-EPINEPHRINE (PF) 1 %-1:200000 IJ SOLN
INTRAMUSCULAR | Status: DC | PRN
  Administered 2020-08-29: 26 mL

## 2020-08-29 MED ORDER — OXYCODONE HCL 5 MG PO TABS: 5.0000 mg | ORAL_TABLET | ORAL | 0 refills | Status: DC | PRN

## 2020-08-29 MED ORDER — MIDAZOLAM HCL 2 MG/2ML IJ SOLN
INTRAMUSCULAR | Status: AC
  Filled 2020-08-29: qty 2

## 2020-08-29 MED ORDER — DIPHENHYDRAMINE HCL 50 MG/ML IJ SOLN
INTRAMUSCULAR | Status: DC | PRN
  Administered 2020-08-29: 12.5 mg via INTRAVENOUS

## 2020-08-29 MED ORDER — THROMBIN (RECOMBINANT) 20000 UNITS EX SOLR
CUTANEOUS | Status: AC
  Filled 2020-08-29: qty 20000

## 2020-08-29 MED ORDER — ROCURONIUM BROMIDE 10 MG/ML (PF) SYRINGE
PREFILLED_SYRINGE | INTRAVENOUS | Status: AC
  Filled 2020-08-29: qty 10

## 2020-08-29 MED ORDER — SODIUM CHLORIDE 0.9 % IV SOLN: INTRAVENOUS | Status: DC

## 2020-08-29 MED ORDER — 0.9 % SODIUM CHLORIDE (POUR BTL) OPTIME
TOPICAL | Status: DC | PRN
  Administered 2020-08-29: 1000 mL

## 2020-08-29 MED ORDER — CHLORHEXIDINE GLUCONATE 4 % EX LIQD: 60.0000 mL | Freq: Once | CUTANEOUS | Status: DC

## 2020-08-29 MED ORDER — SUCCINYLCHOLINE CHLORIDE 200 MG/10ML IV SOSY
PREFILLED_SYRINGE | INTRAVENOUS | Status: AC
  Filled 2020-08-29: qty 10

## 2020-08-29 MED ORDER — LIDOCAINE-EPINEPHRINE (PF) 1 %-1:200000 IJ SOLN
INTRAMUSCULAR | Status: AC
  Filled 2020-08-29: qty 30

## 2020-08-29 MED ORDER — SODIUM CHLORIDE 0.9 % IV SOLN
INTRAVENOUS | Status: AC
  Filled 2020-08-29: qty 1.2

## 2020-08-29 MED ORDER — LACTATED RINGERS IV SOLN: INTRAVENOUS | Status: DC

## 2020-08-29 MED ORDER — ORAL CARE MOUTH RINSE: 15.0000 mL | Freq: Once | OROMUCOSAL | Status: AC

## 2020-08-29 MED ORDER — HEPARIN SODIUM (PORCINE) 1000 UNIT/ML IJ SOLN
INTRAMUSCULAR | Status: DC | PRN
  Administered 2020-08-29: 10000 [IU] via INTRAVENOUS

## 2020-08-29 MED ORDER — HEPARIN SODIUM (PORCINE) 1000 UNIT/ML IJ SOLN
INTRAMUSCULAR | Status: AC
  Filled 2020-08-29: qty 1

## 2020-08-29 MED ORDER — CEFAZOLIN SODIUM-DEXTROSE 2-4 GM/100ML-% IV SOLN
2.0000 g | INTRAVENOUS | Status: AC
  Administered 2020-08-29: 2 g via INTRAVENOUS
  Filled 2020-08-29: qty 100

## 2020-08-29 SURGICAL SUPPLY — 33 items
ADH SKN CLS APL DERMABOND .7 (GAUZE/BANDAGES/DRESSINGS) ×1
ARMBAND PINK RESTRICT EXTREMIT (MISCELLANEOUS) ×3 IMPLANT
CANISTER SUCT 3000ML PPV (MISCELLANEOUS) ×3 IMPLANT
CANNULA VESSEL 3MM 2 BLNT TIP (CANNULA) ×3 IMPLANT
CLIP VESOCCLUDE MED 24/CT (CLIP) ×3 IMPLANT
CLIP VESOCCLUDE SM WIDE 24/CT (CLIP) ×3 IMPLANT
COVER PROBE W GEL 5X96 (DRAPES) IMPLANT
COVER WAND RF STERILE (DRAPES) ×3 IMPLANT
DECANTER SPIKE VIAL GLASS SM (MISCELLANEOUS) ×3 IMPLANT
DERMABOND ADVANCED (GAUZE/BANDAGES/DRESSINGS) ×2
DERMABOND ADVANCED .7 DNX12 (GAUZE/BANDAGES/DRESSINGS) ×1 IMPLANT
ELECT REM PT RETURN 9FT ADLT (ELECTROSURGICAL) ×3
ELECTRODE REM PT RTRN 9FT ADLT (ELECTROSURGICAL) ×1 IMPLANT
GLOVE BIO SURGEON STRL SZ7.5 (GLOVE) ×3 IMPLANT
GLOVE SRG 8 PF TXTR STRL LF DI (GLOVE) ×1 IMPLANT
GLOVE SURG UNDER POLY LF SZ8 (GLOVE) ×3
GOWN STRL REUS W/ TWL LRG LVL3 (GOWN DISPOSABLE) ×3 IMPLANT
GOWN STRL REUS W/TWL LRG LVL3 (GOWN DISPOSABLE) ×9
GRAFT GORETEX STRT 4-7X45 (Vascular Products) ×2 IMPLANT
KIT BASIN OR (CUSTOM PROCEDURE TRAY) ×3 IMPLANT
KIT TURNOVER KIT B (KITS) ×3 IMPLANT
NS IRRIG 1000ML POUR BTL (IV SOLUTION) ×3 IMPLANT
PACK CV ACCESS (CUSTOM PROCEDURE TRAY) ×3 IMPLANT
PAD ARMBOARD 7.5X6 YLW CONV (MISCELLANEOUS) ×6 IMPLANT
SPONGE SURGIFOAM ABS GEL 100 (HEMOSTASIS) IMPLANT
SUT PROLENE 6 0 BV (SUTURE) ×8 IMPLANT
SUT SILK 2 0 SH (SUTURE) IMPLANT
SUT VIC AB 3-0 SH 27 (SUTURE) ×6
SUT VIC AB 3-0 SH 27X BRD (SUTURE) ×2 IMPLANT
SUT VICRYL 4-0 PS2 18IN ABS (SUTURE) ×6 IMPLANT
TOWEL GREEN STERILE (TOWEL DISPOSABLE) ×3 IMPLANT
UNDERPAD 30X36 HEAVY ABSORB (UNDERPADS AND DIAPERS) ×3 IMPLANT
WATER STERILE IRR 1000ML POUR (IV SOLUTION) ×3 IMPLANT

## 2020-08-29 NOTE — H&P (Signed)
REASON FOR VISIT:   For new access in the right arm  MEDICAL ISSUES:   END-STAGE RENAL DISEASE: This patient underwent removal of an infected right upper arm fistula.  He presents now for new access. Based on his duplex scan it looks like you might be a candidate for a basilic vein transposition.  I have explained that we would do this in 2 stages.  If the vein was not adequate we would place an AV graft.  Given the contracture in the left arm I do not think he can have access there.   He dialyzes on Tuesdays Thursdays and Saturdays.  HPI:   Bryce Miller is a pleasant 60 y.o. male who had presented with an aneurysmal right upper arm fistula that had become infected and was at risk for bleeding.  He was sent from the dialysis center to the emergency department and was brought to the operating room to excise the infected area and ligate the fistula.  In addition he had a tunneled dialysis catheter that was nonfunctioning.  On 07/05/2020 he underwent ultrasound-guided placement of a right IJ tunneled dialysis catheter, removal of his left IJ catheter, and excision of an infected right brachiocephalic fistula with ligation of the fistula.   The patient has had a stroke and is significantly disabled.  History is obtained from his brother.       Past Medical History:  Diagnosis Date  . Chronic kidney disease    functioning at 18%  . Constipation   . Diabetes mellitus without complication ([place])    "borderline"  . GERD (gastroesophageal reflux disease)   . Hypertension   . 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 Stroke [facility])    left side weakness         Family History  Problem Relation Age of Onset  . Diabetes Mother     SOCIAL HISTORY: Social History       Tobacco Use  . Smoking status: Never Smoker  . Smokeless tobacco: Never Used  Substance Use Topics  . Alcohol use: No    No Known Allergies        Current Outpatient Medications  Medication  Sig Dispense Refill  . furosemide (LASIX) 40 MG tablet Take 40 mg by mouth daily.     . hydrALAZINE (APRESOLINE) 50 MG tablet Take 50 mg by mouth 3 (three) times daily.     . metoprolol tartrate (LOPRESSOR) 25 MG tablet Take 25 mg by mouth daily.     Marland Kitchen omeprazole (PRILOSEC) 20 MG capsule Take 20 mg by mouth daily.    . sevelamer carbonate (RENVELA) 800 MG tablet Take 800-1,600 mg by mouth See admin instructions. 1600 mg with meals, 800 mg with snacks    . oxyCODONE-acetaminophen (PERCOCET) 5-325 MG tablet Take 1 tablet by mouth every 4 (four) hours as needed for severe pain. (Patient not taking: No sig reported) 20 tablet 0            Current Facility-Administered Medications  Medication Dose Route Frequency Provider Last Rate Last Admin  . 0.9 %  sodium chloride infusion  250 mL Intravenous PRN Waynetta Sandy, MD        REVIEW OF SYSTEMS:  '[X]'$  denotes positive finding, '[ ]'$  denotes negative finding Cardiac  Comments:  Chest pain or chest pressure:    Shortness of breath upon exertion:    Short of breath when lying flat:    Irregular heart rhythm:        Vascular  Pain in calf, thigh, or hip brought on by ambulation:    Pain in feet at night that wakes you up from your sleep:     Blood clot in your veins:    Leg swelling:         Pulmonary    Oxygen at home:    Productive cough:     Wheezing:         Neurologic    Sudden weakness in arms or legs:     Sudden numbness in arms or legs:     Sudden onset of difficulty speaking or slurred speech:    Temporary loss of vision in one eye:     Problems with dizziness:         Gastrointestinal    Blood in stool:     Vomited blood:         Genitourinary    Burning when urinating:     Blood in urine:        Psychiatric    Major depression:         Hematologic    Bleeding problems:    Problems with blood clotting too easily:         Skin    Rashes or ulcers:        Constitutional    Fever or chills:     PHYSICAL EXAM:       Vitals:   08/29/20 0622  Pulse: 71  Resp: 17  Temp: 98 F (36.7 C)  SpO2: 98%    GENERAL: The patient is a well-nourished male, in no acute distress. The vital signs are documented above. CARDIAC: There is a regular rate and rhythm.  VASCULAR: He has a palpable right radial pulse. PULMONARY: There is good air exchange bilaterally without wheezing or rales. NEUROLOGIC: He has a contracture in his left arm from the stroke. SKIN: There are no ulcers or rashes noted. PSYCHIATRIC: The patient has a normal affect.  DATA:    DUPLEX RIGHT ARM: He did have a duplex of his right basilic vein on 123456 that showed that the diameters of the vein ranged from 0.6-0.63 cm.  Deitra Mayo [facility]

## 2020-08-29 NOTE — Op Note (Signed)
    NAME: Bryce Miller    MRN: [id number] DOB: 1961-06-28    DATE OF OPERATION: 08/29/2020  PREOP DIAGNOSIS:    End-stage renal disease  POSTOP DIAGNOSIS:    Same  PROCEDURE:    New right upper arm AV graft (4-7 mm PTFE graft)  SURGEON: Judeth Cornfield. Scot Dock, MD  ASSIST: Olin Pia, RNFA  ANESTHESIA: Local with sedation  EBL: Minimal  INDICATIONS:    [name] is a 60 y.o. male who had an infected right brachiocephalic fistula removed.  He had a stroke on the left side and with his contracture was not a good candidate for access in the left arm.  He presents for new access.  FINDINGS:   I looked at the basilic vein myself with the SonoSite and it became tortuous and branching in the upper arm.  Therefore placed an upper arm graft.  At the completion of the procedure there was a good thrill in the graft and a palpable radial pulse  TECHNIQUE:   The patient was taken to the operating room and sedated by anesthesia.  The right upper extremity was prepped and draped in the usual sterile fashion.  A longitudinal incision was made after the skin was anesthetized just above the antecubital level.  Here the brachial artery was dissected free.  It was quite large given that he had a long-term fistula in the upper arm.  After the skin was anesthetized a separate longitudinal incision was made beneath the axilla.  Here the high brachial vein was dissected free.  This branch multiple times but I selected the largest vein.  A tunnel was created between the 2 incisions after the patient was heparinized and a 4-7 mm PTFE graft tunneled between the 2 incisions.  The patient was then heparinized.  The brachial artery was clamped proximally and distally and a longitudinal arteriotomy was made.  A segment of the 4 mm in the graft was excised the graft spatulated and sewn end-to-side to the brachial artery using continuous 6-0 Prolene suture.  The graft then pulled the appropriate length for  anastomosis to the high brachial vein.  The vein was ligated distally and spatulated proximally.  A valve was excised.  The graft was sewn into into the vein using 2 continuous 6-0 Prolene sutures.  At the completion there was a good thrill in the graft and a palpable radial pulse.  The heparin was partially reversed with protamine.  The wounds were each closed with a deep layer of 3-0 Vicryl and the skin closed with 4-0 Vicryl.  Dermabond was applied.  The patient tolerated the procedure well was transferred to recovery room in stable condition.  All needle and sponge counts were correct.  Given the complexity of the case a first assistant was necessary in order to expedient the procedure and safely perform the technical aspects of the operation.  Deitra Mayo, MD, FACS [facility]  DATE OF DICTATION:   08/29/2020

## 2020-08-29 NOTE — Anesthesia Postprocedure Evaluation (Signed)
Anesthesia Post Note  Patient: Bryce Miller  Procedure(s) Performed: RIGHT FIRST STAGE BASCILIC VEIN TRANSPOSITION VERSUS ARTERIOVENOUS GRAFT (Right )     Patient location during evaluation: PACU Anesthesia Type: MAC Level of consciousness: awake and alert Pain management: pain level controlled Vital Signs Assessment: post-procedure vital signs reviewed and stable Respiratory status: spontaneous breathing, nonlabored ventilation, respiratory function stable and patient connected to nasal cannula oxygen Cardiovascular status: stable and blood pressure returned to baseline Postop Assessment: no apparent nausea or vomiting Anesthetic complications: no   No complications documented.  Last Vitals:  Vitals:   08/29/20 1000 08/29/20 1015  BP: (!) 171/98 (!) 162/84  Pulse: 73 74  Resp: 12 17  Temp:  (!) 36.3 C  SpO2: 100% 97%    Last Pain:  Vitals:   08/29/20 0625  TempSrc:   PainSc: 0-No pain                 March Rummage Elle Vezina

## 2020-08-29 NOTE — Transfer of Care (Signed)
Immediate Anesthesia Transfer of Care Note  Patient: Bryce Miller  Procedure(s) Performed: RIGHT FIRST STAGE BASCILIC VEIN TRANSPOSITION VERSUS ARTERIOVENOUS GRAFT (Right )  Patient Location: PACU  Anesthesia Type:MAC  Level of Consciousness: drowsy  Airway & Oxygen Therapy: Patient Spontanous Breathing and Patient connected to face mask oxygen  Post-op Assessment: Report given to RN and Post -op Vital signs reviewed and stable  Post vital signs: Reviewed and stable  Last Vitals:  Vitals Value Taken Time  BP 160/94 08/29/20 0945  Temp    Pulse 80 08/29/20 0945  Resp 11 08/29/20 0948  SpO2 100 % 08/29/20 0945  Vitals shown include unvalidated device data.  Last Pain:  Vitals:   08/29/20 0625  TempSrc:   PainSc: 0-No pain         Complications: No complications documented.

## 2020-08-29 NOTE — Anesthesia Preprocedure Evaluation (Addendum)
Anesthesia Evaluation  Patient identified by MRN, date of birth, ID band Patient awake    Reviewed: NPO status , Patient's Chart, lab work & pertinent test results  Airway Mallampati: II  TM Distance: >3 FB Neck ROM: Full    Dental  (+) Teeth Intact   Pulmonary    Pulmonary exam normal        Cardiovascular hypertension, Pt. on medications and Pt. 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 Bowel sounds: normal.  Peds  Hematology negative hematology ROS (+)   Anesthesia Other Findings   Reproductive/Obstetrics                            Anesthesia Physical Anesthesia Plan  ASA: III  Anesthesia Plan: MAC   Post-op Pain Management:    Induction: Intravenous  PONV Risk Score and Plan: Ondansetron, Dexamethasone, Propofol infusion and Treatment may vary due to age or medical condition  Airway Management Planned: Simple Face Mask, Natural Airway and Nasal Cannula  Additional Equipment: None  Intra-op Plan:   Post-operative Plan:   Informed Consent: I have reviewed the patients History and Physical, chart, labs and discussed the procedure including the risks, benefits and alternatives for the proposed anesthesia with the patient or authorized representative who has indicated his/her understanding and acceptance.     Dental advisory given  Plan Discussed with: CRNA  Anesthesia Plan Comments:        Anesthesia Quick Evaluation

## 2020-08-30 ENCOUNTER — Encounter (HOSPITAL_COMMUNITY): Payer: Self-pay | Admitting: Vascular Surgery

## 2021-02-01 ENCOUNTER — Telehealth: Payer: Self-pay

## 2021-02-01 ENCOUNTER — Other Ambulatory Visit: Payer: Self-pay

## 2021-02-01 DIAGNOSIS — N186 End stage renal disease: Secondary | ICD-10-CM

## 2021-02-01 NOTE — Telephone Encounter (Signed)
Bryce Harvest, PA called to let us know pt's RUA AVG has clotted 5x since 11/21/20. They are unsure why but it does not appear to be a functional issue. They have been able to declot it easily. His b/p has been stabilized and it still clotted. He had a catheter placed 2 weeks ago. Per MD, he should return to office for an appt for vein mapping and new access. Betsy at the HD center has been given the date/time of his upcoming appt and will give pt this information. We are working on setting up vein mapping for him at [facility]. Juanda Crumble has been made aware as well.

## 2021-02-14 ENCOUNTER — Ambulatory Visit (HOSPITAL_COMMUNITY): Payer: Medicaid Other

## 2021-02-14 ENCOUNTER — Ambulatory Visit (HOSPITAL_COMMUNITY): Admission: RE | Admit: 2021-02-14 | Payer: Medicaid Other | Source: Ambulatory Visit

## 2021-02-15 ENCOUNTER — Encounter: Payer: Self-pay | Admitting: Vascular Surgery

## 2021-02-15 ENCOUNTER — Ambulatory Visit (INDEPENDENT_AMBULATORY_CARE_PROVIDER_SITE_OTHER): Payer: Medicaid Other | Admitting: Vascular Surgery

## 2021-02-15 ENCOUNTER — Other Ambulatory Visit: Payer: Self-pay

## 2021-02-15 VITALS — BP 155/99 | HR 81 | Temp 98.2°F

## 2021-02-15 DIAGNOSIS — N186 End stage renal disease: Secondary | ICD-10-CM

## 2021-02-15 DIAGNOSIS — Z992 Dependence on renal dialysis: Secondary | ICD-10-CM

## 2021-02-15 NOTE — H&P (View-Only) (Signed)
[facility] [place]  Patient name: Bryce Miller MRN: [id number] DOB: [birth year] Sex: male  REASON FOR VISIT: Discuss new access for hemodialysis  HPI: Bryce Miller is a 60 y.o. male here today for discussion of access for hemodialysis.  He is here today with his nephew.  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  His nephew provides history.  He had a right brachiocephalic fistula with good intermediate-term use.  She has essentially failed.  He had placement of right upper arm Gore-Tex graft by Dr. Scot Dock on 08/29/2020.  He has had multiple thromboses of this and this has been abandoned and he now has a central catheter placed by [employer] and he is using this for dialysis.  His nephew reports that he has had some difficulty with clotting of the catheter but no infection.  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  He is in a wheelchair chronically.  Past Medical History:  Diagnosis Date   Constipation    ESRD (end stage renal disease) ([place])    [place]- TTHS   GERD (gastroesophageal reflux disease)    Hypertension    Paraplegic spinal paralysis ([place])    Pre-diabetes    Seizure ([place])    Stroke ([facility])    left side weakness, memory loss    Family History  Problem Relation Age of Onset   Diabetes Mother     SOCIAL HISTORY: Social History   Tobacco Use   Smoking status: Never   Smokeless tobacco: Never  Substance Use Topics   Alcohol use: No    No Known Allergies  Current Outpatient Medications  Medication Sig Dispense Refill   amLODipine (NORVASC) 5 MG tablet Take 5 mg by mouth daily.     calcitRIOL (ROCALTROL) 0.25 MCG capsule Take 0.25 mcg by mouth daily.     FLUoxetine (PROZAC) 10 MG capsule Take 10 mg by mouth daily.     furosemide (LASIX) 40 MG tablet Take 40  mg by mouth daily.      hydrALAZINE (APRESOLINE) 50 MG tablet Take 50 mg by mouth 3 (three) times daily.      levETIRAcetam (KEPPRA) 500 MG tablet Take 500 mg by mouth 2 (two) times daily.     metoprolol tartrate (LOPRESSOR) 25 MG tablet Take 25 mg by mouth daily.      omeprazole (PRILOSEC) 20 MG capsule Take 20 mg by mouth daily.     oxyCODONE (ROXICODONE) 5 MG immediate release tablet Take 1 tablet (5 mg total) by mouth every 4 (four) hours as needed. 15 tablet 0   oxyCODONE (ROXICODONE) 5 MG immediate release tablet Take 1 tablet (5 mg total) by mouth every 4 (four) hours as needed. 15 tablet 0   sevelamer carbonate (RENVELA) 800 MG tablet Take 800-1,600 mg by mouth See admin instructions. 1600 mg with meals, 800 mg with snacks     Current Facility-Administered Medications  Medication Dose Route Frequency Provider Last Rate Last Admin   0.9 %  sodium chloride infusion  250 mL Intravenous PRN Waynetta Sandy, MD        REVIEW OF SYSTEMS:  '[X]'$  denotes positive finding, '[ ]'$  denotes negative finding Cardiac  Comments:  Chest pain or chest pressure:    Shortness of breath upon exertion:    Short of breath when lying flat:  Irregular heart rhythm:        Vascular    Pain in calf, thigh, or hip brought on by ambulation:    Pain in feet at night that wakes you up from your sleep:     Blood clot in your veins:    Leg swelling:           PHYSICAL EXAM: Vitals:   02/15/21 1055  BP: (!) 155/99  Pulse: 81  Temp: 98.2 F (36.8 C)  TempSrc: Oral  SpO2: 94%    GENERAL: The patient is a well-nourished male, in no acute distress. The vital signs are documented above. CARDIOVASCULAR: 2+ radial pulses bilaterally.  Thrombosed right upper arm brachiocephalic fistula and thrombosed right upper arm AV Gore-Tex graft. PULMONARY: There is good air exchange  MUSCULOSKELETAL: There are no major deformities or cyanosis. NEUROLOGIC: No focal weakness or paresthesias are  detected. SKIN: There are no ulcers or rashes noted. PSYCHIATRIC: The patient has a normal affect.  DATA:  I imaged his left arm with SonoSite ultrasound.  He does have contracture but he is able to somewhat straighten his arm out.  He is also having some spastic movement in his left arm.  He does have visible cephalic vein at the antecubital space and this is patent with moderate size with SonoSite evaluation  MEDICAL ISSUES: Difficult access situation.  I discussed options with the patient and his nephew.  He does not have any further right arm access options.  I discussed placement of access in his left arm versus a leg graft.  He does have cephalic vein that is sizable enough to attempt fistula.  He would have availability for this region of his arm for access even with his contracture.  I recommended left brachiocephalic fistula at his next fistula option.  I explained the potential for not maturation.  If this fails he could have a leg graft.  We will schedule this at [facility] for 02/28/2021 as an outpatient    Rosetta Posner, MD [facility] [phone]  Note: Portions of this report may have been transcribed using voice recognition software.  Every effort has been made to ensure accuracy; however, inadvertent computerized transcription errors may still be present.

## 2021-02-15 NOTE — Progress Notes (Signed)
[facility] [place]  Patient name: Bryce Miller MRN: [id number] DOB: [birth year] Sex: male  REASON FOR VISIT: Discuss new access for hemodialysis  HPI: Bryce Miller is a 60 y.o. male here today for discussion of access for hemodialysis.  He is here today with his nephew.  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  His nephew provides history.  He had a right brachiocephalic fistula with good intermediate-term use.  She has essentially failed.  He had placement of right upper arm Gore-Tex graft by Dr. Scot Dock on 08/29/2020.  He has had multiple thromboses of this and this has been abandoned and he now has a central catheter placed by [employer] and he is using this for dialysis.  His nephew reports that he has had some difficulty with clotting of the catheter but no infection.  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  He is in a wheelchair chronically.  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     calcitRIOL (ROCALTROL) 0.25 MCG capsule Take 0.25 mcg by mouth daily.     FLUoxetine (PROZAC) 10 MG capsule Take 10 mg by mouth daily.     furosemide (LASIX) 40 MG tablet Take 40  mg by mouth daily.      hydrALAZINE (APRESOLINE) 50 MG tablet Take 50 mg by mouth 3 (three) times daily.      levETIRAcetam (KEPPRA) 500 MG tablet Take 500 mg by mouth 2 (two) times daily.     metoprolol tartrate (LOPRESSOR) 25 MG tablet Take 25 mg by mouth daily.      omeprazole (PRILOSEC) 20 MG capsule Take 20 mg by mouth daily.     oxyCODONE (ROXICODONE) 5 MG immediate release tablet Take 1 tablet (5 mg total) by mouth every 4 (four) hours as needed. 15 tablet 0   oxyCODONE (ROXICODONE) 5 MG immediate release tablet Take 1 tablet (5 mg total) by mouth every 4 (four) hours as needed. 15 tablet 0   sevelamer carbonate (RENVELA) 800 MG tablet Take 800-1,600 mg by mouth See admin instructions. 1600 mg with meals, 800 mg with snacks     Current Facility-Administered Medications  Medication Dose Route Frequency Provider Last Rate Last Admin   0.9 %  sodium chloride infusion  250 mL Intravenous PRN Waynetta Sandy, MD        REVIEW OF SYSTEMS:  '[X]'$  denotes positive finding, '[ ]'$  denotes negative finding Cardiac  Comments:  Chest pain or chest pressure:    Shortness of breath upon exertion:    Short of breath when lying flat:  Irregular heart rhythm:        Vascular    Pain in calf, thigh, or hip brought on by ambulation:    Pain in feet at night that wakes you up from your sleep:     Blood clot in your veins:    Leg swelling:           PHYSICAL EXAM: Vitals:   02/15/21 1055  BP: (!) 155/99  Pulse: 81  Temp: 98.2 F (36.8 C)  TempSrc: Oral  SpO2: 94%    GENERAL: The patient is a well-nourished male, in no acute distress. The vital signs are documented above. CARDIOVASCULAR: 2+ radial pulses bilaterally.  Thrombosed right upper arm brachiocephalic fistula and thrombosed right upper arm AV Gore-Tex graft. PULMONARY: There is good air exchange  MUSCULOSKELETAL: There are no major deformities or cyanosis. NEUROLOGIC: No focal weakness or paresthesias are  detected. SKIN: There are no ulcers or rashes noted. PSYCHIATRIC: The patient has a normal affect.  DATA:  I imaged his left arm with SonoSite ultrasound.  He does have contracture but he is able to somewhat straighten his arm out.  He is also having some spastic movement in his left arm.  He does have visible cephalic vein at the antecubital space and this is patent with moderate size with SonoSite evaluation  MEDICAL ISSUES: Difficult access situation.  I discussed options with the patient and his nephew.  He does not have any further right arm access options.  I discussed placement of access in his left arm versus a leg graft.  He does have cephalic vein that is sizable enough to attempt fistula.  He would have availability for this region of his arm for access even with his contracture.  I recommended left brachiocephalic fistula at his next fistula option.  I explained the potential for not maturation.  If this fails he could have a leg graft.  We will schedule [facility] at [facility] for 02/28/2021 as an outpatient    Rosetta Posner, MD [facility] [phone]  Note: Portions of this report may have been transcribed using voice recognition software.  Every effort has been made to ensure accuracy; however, inadvertent computerized transcription errors may still be present.

## 2021-02-17 ENCOUNTER — Other Ambulatory Visit: Payer: Self-pay

## 2021-02-23 ENCOUNTER — Other Ambulatory Visit: Payer: Self-pay

## 2021-02-23 ENCOUNTER — Encounter (HOSPITAL_COMMUNITY)
Admission: RE | Admit: 2021-02-23 | Discharge: 2021-02-23 | Disposition: A | Discharge status: Home / Self Care | Payer: Medicaid Other | Source: Ambulatory Visit | Attending: Vascular Surgery | Admitting: Vascular Surgery

## 2021-02-28 ENCOUNTER — Ambulatory Visit (HOSPITAL_COMMUNITY): Payer: Medicaid Other | Admitting: Certified Registered Nurse Anesthetist

## 2021-02-28 ENCOUNTER — Encounter (HOSPITAL_COMMUNITY)
Admission: RE | Disposition: A | Discharge status: Home / Self Care | Payer: Self-pay | Source: Home / Self Care | Attending: Vascular Surgery

## 2021-02-28 ENCOUNTER — Encounter (HOSPITAL_COMMUNITY): Payer: Self-pay | Admitting: Vascular Surgery

## 2021-02-28 ENCOUNTER — Ambulatory Visit (HOSPITAL_COMMUNITY)
Admission: RE | Admit: 2021-02-28 | Discharge: 2021-02-28 | Disposition: A | Discharge status: Home / Self Care | Payer: Medicaid Other | Attending: Vascular Surgery | Admitting: Vascular Surgery

## 2021-02-28 DIAGNOSIS — Z79899 Other long term (current) drug therapy: Secondary | ICD-10-CM | POA: Insufficient documentation

## 2021-02-28 DIAGNOSIS — I120 Hypertensive chronic kidney disease with stage 5 chronic kidney disease or end stage renal disease: Secondary | ICD-10-CM | POA: Insufficient documentation

## 2021-02-28 DIAGNOSIS — Z993 Dependence on wheelchair: Secondary | ICD-10-CM | POA: Diagnosis not present

## 2021-02-28 DIAGNOSIS — I69311 Memory deficit following cerebral infarction: Secondary | ICD-10-CM | POA: Insufficient documentation

## 2021-02-28 DIAGNOSIS — N186 End stage renal disease: Secondary | ICD-10-CM | POA: Diagnosis not present

## 2021-02-28 DIAGNOSIS — I69354 Hemiplegia and hemiparesis following cerebral infarction affecting left non-dominant side: Secondary | ICD-10-CM | POA: Insufficient documentation

## 2021-02-28 HISTORY — PX: AV FISTULA PLACEMENT: SHX1204

## 2021-02-28 LAB — POCT I-STAT, CHEM 8
BUN: 31 mg/dL — ABNORMAL HIGH (ref 6–20)
Calcium, Ion: 1.13 mmol/L — ABNORMAL LOW (ref 1.15–1.40)
Chloride: 99 mmol/L (ref 98–111)
Creatinine, Ser: 8.4 mg/dL — ABNORMAL HIGH (ref 0.61–1.24)
Glucose, Bld: 85 mg/dL (ref 70–99)
HCT: 37 % — ABNORMAL LOW (ref 39.0–52.0)
Hemoglobin: 12.6 g/dL — ABNORMAL LOW (ref 13.0–17.0)
Potassium: 3.8 mmol/L (ref 3.5–5.1)
Sodium: 140 mmol/L (ref 135–145)
TCO2: 29 mmol/L (ref 22–32)

## 2021-02-28 SURGERY — ARTERIOVENOUS (AV) FISTULA CREATION
Anesthesia: General | Laterality: Left

## 2021-02-28 MED ORDER — PROPOFOL 500 MG/50ML IV EMUL
INTRAVENOUS | Status: DC | PRN
  Administered 2021-02-28: 25 ug/kg/min via INTRAVENOUS

## 2021-02-28 MED ORDER — MIDAZOLAM HCL 2 MG/2ML IJ SOLN
INTRAMUSCULAR | Status: DC | PRN
  Administered 2021-02-28: 2 mg via INTRAVENOUS

## 2021-02-28 MED ORDER — 0.9 % SODIUM CHLORIDE (POUR BTL) OPTIME
TOPICAL | Status: DC | PRN
  Administered 2021-02-28: 1000 mL

## 2021-02-28 MED ORDER — CHLORHEXIDINE GLUCONATE 4 % EX LIQD: 60.0000 mL | Freq: Once | CUTANEOUS | Status: DC

## 2021-02-28 MED ORDER — PROPOFOL 10 MG/ML IV BOLUS
INTRAVENOUS | Status: AC
  Filled 2021-02-28: qty 20

## 2021-02-28 MED ORDER — PROPOFOL 10 MG/ML IV BOLUS
INTRAVENOUS | Status: AC
  Filled 2021-02-28: qty 40

## 2021-02-28 MED ORDER — FENTANYL CITRATE (PF) 100 MCG/2ML IJ SOLN
INTRAMUSCULAR | Status: DC | PRN
  Administered 2021-02-28 (×2): 25 ug via INTRAVENOUS

## 2021-02-28 MED ORDER — FENTANYL CITRATE (PF) 100 MCG/2ML IJ SOLN
INTRAMUSCULAR | Status: AC
  Filled 2021-02-28: qty 2

## 2021-02-28 MED ORDER — HEPARIN 6000 UNIT IRRIGATION SOLUTION
Status: DC | PRN
  Administered 2021-02-28: 1

## 2021-02-28 MED ORDER — LIDOCAINE-EPINEPHRINE 0.5 %-1:200000 IJ SOLN
INTRAMUSCULAR | Status: DC | PRN
  Administered 2021-02-28: 9 mL

## 2021-02-28 MED ORDER — SODIUM CHLORIDE 0.9 % IV SOLN: INTRAVENOUS | Status: DC

## 2021-02-28 MED ORDER — LIDOCAINE-EPINEPHRINE 0.5 %-1:200000 IJ SOLN
INTRAMUSCULAR | Status: AC
  Filled 2021-02-28: qty 1

## 2021-02-28 MED ORDER — LIDOCAINE HCL (CARDIAC) PF 100 MG/5ML IV SOSY
PREFILLED_SYRINGE | INTRAVENOUS | Status: DC | PRN
  Administered 2021-02-28: 60 mg via INTRATRACHEAL

## 2021-02-28 MED ORDER — FENTANYL CITRATE (PF) 100 MCG/2ML IJ SOLN: 25.0000 ug | INTRAMUSCULAR | Status: DC | PRN

## 2021-02-28 MED ORDER — LACTATED RINGERS IV SOLN: INTRAVENOUS | Status: DC

## 2021-02-28 MED ORDER — HEPARIN SODIUM (PORCINE) 1000 UNIT/ML IJ SOLN
INTRAMUSCULAR | Status: AC
  Filled 2021-02-28: qty 6

## 2021-02-28 MED ORDER — CEFAZOLIN SODIUM-DEXTROSE 2-4 GM/100ML-% IV SOLN
2.0000 g | INTRAVENOUS | Status: AC
  Administered 2021-02-28: 2 g via INTRAVENOUS
  Filled 2021-02-28: qty 100

## 2021-02-28 MED ORDER — OXYCODONE-ACETAMINOPHEN 5-325 MG PO TABS
1.0000 | ORAL_TABLET | Freq: Four times a day (QID) | ORAL | 0 refills | Status: DC | PRN
Start: 2021-02-28 — End: 2024-02-04

## 2021-02-28 MED ORDER — MIDAZOLAM HCL 2 MG/2ML IJ SOLN
INTRAMUSCULAR | Status: AC
  Filled 2021-02-28: qty 2

## 2021-02-28 MED ORDER — ONDANSETRON HCL 4 MG/2ML IJ SOLN: 4.0000 mg | Freq: Once | INTRAMUSCULAR | Status: DC | PRN

## 2021-02-28 MED ORDER — PROPOFOL 10 MG/ML IV BOLUS
INTRAVENOUS | Status: DC | PRN
  Administered 2021-02-28: 20 mg via INTRAVENOUS

## 2021-02-28 SURGICAL SUPPLY — 42 items
ADH SKN CLS APL DERMABOND .7 (GAUZE/BANDAGES/DRESSINGS) ×1
ARMBAND PINK RESTRICT EXTREMIT (MISCELLANEOUS) ×2 IMPLANT
BAG HAMPER (MISCELLANEOUS) ×2 IMPLANT
CANNULA VESSEL 3MM 2 BLNT TIP (CANNULA) ×2 IMPLANT
CLIP LIGATING EXTRA MED SLVR (CLIP) ×2 IMPLANT
CLIP LIGATING EXTRA SM BLUE (MISCELLANEOUS) ×2 IMPLANT
COVER LIGHT HANDLE STERIS (MISCELLANEOUS) ×4 IMPLANT
COVER MAYO STAND XLG (MISCELLANEOUS) ×2 IMPLANT
COVER PROBE W GEL 5X96 (DRAPES) IMPLANT
DECANTER SPIKE VIAL GLASS SM (MISCELLANEOUS) ×2 IMPLANT
DERMABOND ADVANCED (GAUZE/BANDAGES/DRESSINGS) ×1
DERMABOND ADVANCED .7 DNX12 (GAUZE/BANDAGES/DRESSINGS) ×1 IMPLANT
ELECT REM PT RETURN 9FT ADLT (ELECTROSURGICAL) ×2
ELECTRODE REM PT RTRN 9FT ADLT (ELECTROSURGICAL) ×1 IMPLANT
GAUZE SPONGE 4X4 12PLY STRL (GAUZE/BANDAGES/DRESSINGS) ×2 IMPLANT
GLOVE SS BIOGEL STRL SZ 7.5 (GLOVE) ×1 IMPLANT
GLOVE SUPERSENSE BIOGEL SZ 7.5 (GLOVE) ×1
GLOVE SURG LTX SZ7 (GLOVE) ×2 IMPLANT
GLOVE SURG UNDER POLY LF SZ7 (GLOVE) ×6 IMPLANT
GOWN STRL REUS W/TWL LRG LVL3 (GOWN DISPOSABLE) ×6 IMPLANT
IV NS 500ML (IV SOLUTION) ×2
IV NS 500ML BAXH (IV SOLUTION) ×1 IMPLANT
KIT BLADEGUARD II DBL (SET/KITS/TRAYS/PACK) ×2 IMPLANT
KIT TURNOVER KIT A (KITS) ×2 IMPLANT
MANIFOLD NEPTUNE II (INSTRUMENTS) ×2 IMPLANT
MARKER SKIN DUAL TIP RULER LAB (MISCELLANEOUS) ×4 IMPLANT
NDL HYPO 18GX1.5 BLUNT FILL (NEEDLE) ×1 IMPLANT
NEEDLE HYPO 18GX1.5 BLUNT FILL (NEEDLE) ×2 IMPLANT
NS IRRIG 1000ML POUR BTL (IV SOLUTION) ×2 IMPLANT
PACK CV ACCESS (CUSTOM PROCEDURE TRAY) ×2 IMPLANT
PAD ARMBOARD 7.5X6 YLW CONV (MISCELLANEOUS) ×4 IMPLANT
SET BASIN LINEN APH (SET/KITS/TRAYS/PACK) ×2 IMPLANT
SOL PREP POV-IOD 4OZ 10% (MISCELLANEOUS) ×2 IMPLANT
SOL PREP PROV IODINE SCRUB 4OZ (MISCELLANEOUS) ×2 IMPLANT
SUT PROLENE 6 0 CC (SUTURE) ×3 IMPLANT
SUT SILK 2 0 SH (SUTURE) IMPLANT
SUT VIC AB 3-0 SH 27 (SUTURE) ×2
SUT VIC AB 3-0 SH 27X BRD (SUTURE) ×1 IMPLANT
SYR 10ML LL (SYRINGE) ×2 IMPLANT
SYR BULB IRRIG 60ML STRL (SYRINGE) ×2 IMPLANT
SYR CONTROL 10ML LL (SYRINGE) ×2 IMPLANT
UNDERPAD 30X36 HEAVY ABSORB (UNDERPADS AND DIAPERS) ×2 IMPLANT

## 2021-02-28 NOTE — Discharge Instructions (Signed)
[facility]in Specialists of [facility]  Discharge Instructions  AV Fistula or Graft Surgery for Dialysis Access  Please refer to the following instructions for your post-procedure care. Your surgeon or physician assistant will discuss any changes with you.  Activity  You may drive the day following your surgery, if you are comfortable and no longer taking prescription pain medication. Resume full activity as the soreness in your incision resolves.  Bathing/Showering  You may shower after you go home. Keep your incision dry for 48 hours. Do not soak in a bathtub, hot tub, or swim until the incision heals completely. You may not shower if you have a hemodialysis catheter.  Incision Care  Clean your incision with mild soap and water after 48 hours. Pat the area dry with a clean towel. You do not need a bandage unless otherwise instructed. Do not apply any ointments or creams to your incision. You may have skin glue on your incision. Do not peel it off. It will come off on its own in about one week. Your arm may swell a bit after surgery. To reduce swelling use pillows to elevate your arm so it is above your heart. Your doctor will tell you if you need to lightly wrap your arm with an ACE bandage.  Diet  Resume your normal diet. There are not special food restrictions following this procedure. In order to heal from your surgery, it is CRITICAL to get adequate nutrition. Your body requires vitamins, minerals, and protein. Vegetables are the best source of vitamins and minerals. Vegetables also provide the perfect balance of protein. Processed food has little nutritional value, so try to avoid this.  Medications  Resume taking all of your medications. If your incision is causing pain, you may take over-the counter pain relievers such as acetaminophen (Tylenol). If you were prescribed a stronger pain medication, please be aware these medications can cause nausea and constipation. Prevent  nausea by taking the medication with a snack or meal. Avoid constipation by drinking plenty of fluids and eating foods with high amount of fiber, such as fruits, vegetables, and grains.  Do not take Tylenol if you are taking prescription pain medications.  Follow up Your surgeon may want to see you in the office following your access surgery. If so, this will be arranged at the time of your surgery.  Please call us immediately for any of the following conditions:  Increased pain, redness, drainage (pus) from your incision site Fever of 101 degrees or higher Severe or worsening pain at your incision site Hand pain or numbness.  Reduce your risk of vascular disease:  Stop smoking. If you would like help, call [facility] at 1-800-QUIT-NOW (743)681-2648) or [place] [facility] [place] your cholesterol Maintain a desired weight Control your diabetes Keep your blood pressure down  Dialysis  It will take several weeks to several months for your new dialysis access to be ready for use. Your surgeon will determine when it is okay to use it. Your nephrologist will continue to direct your dialysis. You can continue to use your Permcath until your new access is ready for use.   02/28/2021 Bryce Miller [id number] 1960-10-18  Surgeon(s): Luetta Piazza, Arvilla Meres, MD  Procedure(s): LEFT ARM ARTERIOVENOUS (AV) FISTULA CREATION   May stick graft immediately   May stick graft on designated area only:    Do not stick fistula for 12 weeks    If you have any questions, please call the office at [phone].

## 2021-02-28 NOTE — Anesthesia Preprocedure Evaluation (Signed)
Anesthesia Evaluation  Patient identified by MRN, date of birth, ID band Patient awake    Reviewed: Allergy & Precautions, H&P , NPO status , Patient's Chart, lab work & pertinent test results, reviewed documented beta blocker date and time   Airway Mallampati: II  TM Distance: >3 FB Neck ROM: full    Dental no notable dental hx.    Pulmonary neg pulmonary ROS,    Pulmonary exam normal breath sounds clear to auscultation       Cardiovascular Exercise Tolerance: Good hypertension, negative cardio ROS   Rhythm:regular Rate:Normal     Neuro/Psych Seizures -, Well Controlled,  CVA, Residual Symptoms negative psych ROS   GI/Hepatic Neg liver ROS, GERD  Medicated,  Endo/Other  negative endocrine ROS  Renal/GU ESRFRenal disease  negative genitourinary   Musculoskeletal   Abdominal   Peds  Hematology negative hematology ROS (+)   Anesthesia Other Findings   Reproductive/Obstetrics negative OB ROS                             Anesthesia Physical Anesthesia Plan  ASA: 3  Anesthesia Plan: General   Post-op Pain Management:    Induction:   PONV Risk Score and Plan: Propofol infusion  Airway Management Planned:   Additional Equipment:   Intra-op Plan:   Post-operative Plan:   Informed Consent: I have reviewed the patients History and Physical, chart, labs and discussed the procedure including the risks, benefits and alternatives for the proposed anesthesia with the patient or authorized representative who has indicated his/her understanding and acceptance.     Dental Advisory Given  Plan Discussed with: CRNA  Anesthesia Plan Comments:         Anesthesia Quick Evaluation

## 2021-02-28 NOTE — Interval H&P Note (Signed)
History and Physical Interval Note:  02/28/2021 8:55 AM  Bryce Miller  has presented today for surgery, with the diagnosis of ESRD.  The various methods of treatment have been discussed with the patient and family. After consideration of risks, benefits and other options for treatment, the patient has consented to  Procedure(s): LEFT ARM ARTERIOVENOUS (AV) FISTULA CREATION (Left) as a surgical intervention.  The patient's history has been reviewed, patient examined, no change in status, stable for surgery.  I have reviewed the patient's chart and labs.  Questions were answered to the patient's satisfaction.     Curt Jews

## 2021-02-28 NOTE — Op Note (Signed)
    OPERATIVE REPORT  DATE OF SURGERY: 02/28/2021  PATIENT: Bryce Miller, 60 y.o. male MRN: [id number]  DOB: 1961-03-12  PRE-OPERATIVE DIAGNOSIS: End-stage renal disease  POST-OPERATIVE DIAGNOSIS:  Same  PROCEDURE: Left brachiocephalic AV fistula  SURGEON:  Curt Jews, M.D.  PHYSICIAN ASSISTANT: Vevelyn Royals, RN  The assistant was needed for exposure and to expedite the case  ANESTHESIA: Local with sedation  EBL: per anesthesia record  Total I/O In: 500 [I.V.:400; IV Piggyback:100] Out: 5 [Blood:5]  BLOOD ADMINISTERED: none  DRAINS: none  SPECIMEN: none  COUNTS CORRECT:  YES  PATIENT DISPOSITION:  PACU - hemodynamically stable  PROCEDURE DETAILS: The patient was taken everyplace supine position with area of the left arm prepped and draped in sterile fashion.  The patient had contracture from prior stroke.  The arm was extended as much as possible.  Using local anesthesia incision was made over the antecubital space and carried to isolate the cephalic vein which was of large caliber and the brachial artery which was also of large caliber.  The vein was mobilized and tributary branches were ligated with 3-0 and 4 silk ties and divided.  The vein was ligated distally and divided and was mobilized to the level of the brachial artery.  The artery was occluded proximally distally and was opened with an 11 blade Celestia Potts scissors.  The vein was cut to the appropriate length and was sewn end-to-side to the artery with a running 6-0 Prolene suture.  Clamps removed and excellent thrill was noted.  The wounds were irrigated with saline.  Hemostased electrocautery.  Wounds were closed with 3-0 Vicryl in the subcutaneous and subcuticular tissue.  Sterile dressing was applied and the patient was transferred to the recovery room in stable condition   Rosetta Posner, M.D., [facility] 02/28/2021 11:09 AM  Note: Portions of this report may have been transcribed using voice recognition  software.  Every effort has been made to ensure accuracy; however, inadvertent computerized transcription errors may still be present.

## 2021-02-28 NOTE — Transfer of Care (Signed)
Immediate Anesthesia Transfer of Care Note  Patient: Bryce Miller  Procedure(s) Performed: LEFT ARM ARTERIOVENOUS (AV) FISTULA CREATION (Left)  Patient Location: Short Stay  Anesthesia Type:General  Level of Consciousness: awake and alert   Airway & Oxygen Therapy: Patient Spontanous Breathing  Post-op Assessment: Report given to RN and Post -op Vital signs reviewed and stable  Post vital signs: Reviewed and stable  Last Vitals:  Vitals Value Taken Time  BP    Temp    Pulse    Resp    SpO2      Last Pain:  Vitals:   02/28/21 0900  PainSc: 0-No pain         Complications: No notable events documented.

## 2021-02-28 NOTE — Anesthesia Postprocedure Evaluation (Signed)
Anesthesia Post Note  Patient: Bryce Miller  Procedure(s) Performed: LEFT ARM ARTERIOVENOUS (AV) FISTULA CREATION (Left)  Patient location during evaluation: Phase II Anesthesia Type: General Level of consciousness: awake Pain management: pain level controlled Vital Signs Assessment: post-procedure vital signs reviewed and stable Respiratory status: spontaneous breathing and respiratory function stable Cardiovascular status: blood pressure returned to baseline and stable Postop Assessment: no headache and no apparent nausea or vomiting Anesthetic complications: no Comments: Late entry   No notable events documented.   Last Vitals:  Vitals:   02/28/21 0930 02/28/21 1103  BP:  (!) 145/94  Pulse: 69 86  Resp: 19 18  Temp:  36.8 C  SpO2: 97% 100%    Last Pain:  Vitals:   02/28/21 1103  TempSrc: Oral  PainSc: 0-No pain                 Louann Sjogren

## 2021-02-28 NOTE — Progress Notes (Signed)
Talked with Dr Donnetta Hutching. Ok to start IV in right arm.

## 2021-03-01 ENCOUNTER — Ambulatory Visit: Payer: Medicaid Other | Admitting: Vascular Surgery

## 2021-03-01 ENCOUNTER — Encounter (HOSPITAL_COMMUNITY): Payer: Self-pay | Admitting: Vascular Surgery

## 2021-04-11 ENCOUNTER — Other Ambulatory Visit: Payer: Self-pay | Admitting: *Deleted

## 2021-04-11 DIAGNOSIS — N186 End stage renal disease: Secondary | ICD-10-CM

## 2021-04-12 ENCOUNTER — Encounter: Payer: Medicaid Other | Admitting: Vascular Surgery

## 2021-05-21 ENCOUNTER — Other Ambulatory Visit: Payer: Self-pay

## 2021-05-21 ENCOUNTER — Emergency Department (HOSPITAL_COMMUNITY): Payer: Medicaid Other

## 2021-05-21 ENCOUNTER — Emergency Department (HOSPITAL_COMMUNITY)
Admission: EM | Admit: 2021-05-21 | Discharge: 2021-05-21 | Disposition: A | Discharge status: Home / Self Care | Payer: Medicaid Other | Attending: Emergency Medicine | Admitting: Emergency Medicine

## 2021-05-21 ENCOUNTER — Encounter (HOSPITAL_COMMUNITY): Payer: Self-pay | Admitting: *Deleted

## 2021-05-21 DIAGNOSIS — K5900 Constipation, unspecified: Secondary | ICD-10-CM

## 2021-05-21 DIAGNOSIS — N186 End stage renal disease: Secondary | ICD-10-CM | POA: Diagnosis not present

## 2021-05-21 DIAGNOSIS — R1110 Vomiting, unspecified: Secondary | ICD-10-CM | POA: Diagnosis present

## 2021-05-21 DIAGNOSIS — Z79899 Other long term (current) drug therapy: Secondary | ICD-10-CM | POA: Insufficient documentation

## 2021-05-21 DIAGNOSIS — D649 Anemia, unspecified: Secondary | ICD-10-CM | POA: Diagnosis not present

## 2021-05-21 DIAGNOSIS — E876 Hypokalemia: Secondary | ICD-10-CM | POA: Diagnosis not present

## 2021-05-21 DIAGNOSIS — I120 Hypertensive chronic kidney disease with stage 5 chronic kidney disease or end stage renal disease: Secondary | ICD-10-CM | POA: Insufficient documentation

## 2021-05-21 DIAGNOSIS — Z992 Dependence on renal dialysis: Secondary | ICD-10-CM | POA: Diagnosis not present

## 2021-05-21 LAB — URINALYSIS, ROUTINE W REFLEX MICROSCOPIC
Bilirubin Urine: NEGATIVE
Glucose, UA: 50 mg/dL — AB
Ketones, ur: NEGATIVE mg/dL
Leukocytes,Ua: NEGATIVE
Nitrite: NEGATIVE
Protein, ur: 100 mg/dL — AB
Specific Gravity, Urine: 1.01 (ref 1.005–1.030)
pH: 7 (ref 5.0–8.0)

## 2021-05-21 LAB — CBC
HCT: 33 % — ABNORMAL LOW (ref 39.0–52.0)
Hemoglobin: 11.2 g/dL — ABNORMAL LOW (ref 13.0–17.0)
MCH: 30.3 pg (ref 26.0–34.0)
MCHC: 33.9 g/dL (ref 30.0–36.0)
MCV: 89.2 fL (ref 80.0–100.0)
Platelets: 350 10*3/uL (ref 150–400)
RBC: 3.7 MIL/uL — ABNORMAL LOW (ref 4.22–5.81)
RDW: 13.1 % (ref 11.5–15.5)
WBC: 8.8 10*3/uL (ref 4.0–10.5)
nRBC: 0 % (ref 0.0–0.2)

## 2021-05-21 LAB — COMPREHENSIVE METABOLIC PANEL
ALT: 9 U/L (ref 0–44)
AST: 12 U/L — ABNORMAL LOW (ref 15–41)
Albumin: 3.9 g/dL (ref 3.5–5.0)
Alkaline Phosphatase: 51 U/L (ref 38–126)
Anion gap: 12 (ref 5–15)
BUN: 34 mg/dL — ABNORMAL HIGH (ref 6–20)
CO2: 28 mmol/L (ref 22–32)
Calcium: 9.4 mg/dL (ref 8.9–10.3)
Chloride: 93 mmol/L — ABNORMAL LOW (ref 98–111)
Creatinine, Ser: 8.83 mg/dL — ABNORMAL HIGH (ref 0.61–1.24)
GFR, Estimated: 6 mL/min — ABNORMAL LOW (ref >60)
Glucose, Bld: 108 mg/dL — ABNORMAL HIGH (ref 70–99)
Potassium: 3.1 mmol/L — ABNORMAL LOW (ref 3.5–5.1)
Sodium: 133 mmol/L — ABNORMAL LOW (ref 135–145)
Total Bilirubin: 0.9 mg/dL (ref 0.3–1.2)
Total Protein: 8.1 g/dL (ref 6.5–8.1)

## 2021-05-21 LAB — LIPASE, BLOOD: Lipase: 68 U/L — ABNORMAL HIGH (ref 11–51)

## 2021-05-21 MED ORDER — ONDANSETRON 4 MG PO TBDP: ORAL_TABLET | ORAL | 0 refills | Status: DC

## 2021-05-21 MED ORDER — SORBITOL 70 % SOLN
200.0000 mL | TOPICAL_OIL | Freq: Once | ORAL | Status: AC
  Administered 2021-05-21: 200 mL via RECTAL
  Filled 2021-05-21: qty 60

## 2021-05-21 NOTE — Discharge Instructions (Addendum)

## 2021-05-21 NOTE — ED Provider Notes (Signed)
[facility] EMERGENCY DEPARTMENT Provider Note   CSN: [id number] Arrival date & time: 05/21/21  1503     History Chief Complaint  Patient presents with   Emesis    Bryce Miller is a 60 y.o. male with a past medical history of end-stage renal disease, paraplegic spinal paralysis, stroke with left-sided hemispasticity and weakness, hypertension, reflux, history of seizures who presents to the emergency department.  Patient is extremely taciturn and history is supplemented by the patient's family member at bedside.  Patient has had 2 to 3 days of vomiting after each meal.  He denies abdominal pain.  His family member says that he does not speak much although he is able.  His family member believes he has had some weight loss.  Patient denies chest pain or shortness of breath.   Emesis     Past Medical History:  Diagnosis Date   Constipation    ESRD (end stage renal disease) ([place])    [place]- TTHS   GERD (gastroesophageal reflux disease)    Hypertension    Paraplegic spinal paralysis ([place])    Pre-diabetes    Seizure ([place])    Stroke ([place])    left side weakness, memory loss    Patient Active Problem List   Diagnosis Date Noted   ESRD (end stage renal disease) ([place]) 11/19/2017   Fever 11/19/2017   Paraplegic spinal paralysis ([place]) 11/19/2017   Sepsis ([place]) 11/19/2017   Chronic kidney disease (CKD), stage IV (severe) ([place]) 01/19/2016   Colon cancer screening     Past Surgical History:  Procedure Laterality Date   A/V FISTULAGRAM N/A 05/09/2020   Procedure: A/V FISTULAGRAM;  Surgeon: Waynetta Sandy, MD;  Location: [place] [facility];  Service: Cardiovascular;  Laterality: N/A;   A/V SHUNTOGRAM Right 03/21/2018   Procedure: A/V SHUNTOGRAM;  Surgeon: Elam Dutch, MD;  Location: [place] [facility];  Service: Cardiovascular;  Laterality: Right;   AV FISTULA PLACEMENT Right 12/06/2015   Procedure: ARTERIOVENOUS (AV) FISTULA CREATION-RIGHT upper arm ;  Surgeon:  Conrad Arion, MD;  Location: [place];  Service: Vascular;  Laterality: Right;   AV FISTULA PLACEMENT Left 02/28/2021   Procedure: LEFT ARM ARTERIOVENOUS (AV) FISTULA CREATION;  Surgeon: Rosetta Posner, MD;  Location: [facility];  Service: Vascular;  Laterality: Left;   [place] Right 08/29/2020   Procedure: RIGHT FIRST STAGE [place] TRANSPOSITION VERSUS ARTERIOVENOUS GRAFT;  Surgeon: Angelia Mould, MD;  Location: [place];  Service: Vascular;  Laterality: Right;   COLONOSCOPY N/A 09/30/2014   Procedure: COLONOSCOPY;  Surgeon: Daneil Dolin, MD;  Location: [facility];  Service: Endoscopy;  Laterality: N/A;  2:30 AM - moved to 1:10 - Ginger to notify pt   CSF SHUNT     INSERTION OF DIALYSIS CATHETER Right 07/05/2020   Procedure: INSERTION OF TUNNEL  DIALYSIS CATHETER USING 19CM PRECISION CHRONIC CATHETER;  Surgeon: Angelia Mould, MD;  Location: [place];  Service: Vascular;  Laterality: Right;   IR DIALY SHUNT INTRO [place] W/IMG LEFT Left 06/01/2020   IR FLUORO GUIDE CV LINE RIGHT  06/01/2020   LIGATION ARTERIOVENOUS GORTEX GRAFT Right 07/05/2020   Procedure: LIGATION EXCISION  ARTERIOVENOUS GORTEX GRAFT;  Surgeon: Angelia Mould, MD;  Location: [place];  Service: Vascular;  Laterality: Right;   PERIPHERAL VASCULAR BALLOON ANGIOPLASTY Right 03/21/2018   Procedure: PERIPHERAL VASCULAR BALLOON ANGIOPLASTY;  Surgeon: Elam Dutch, MD;  Location: [place] [facility];  Service: Cardiovascular;  Laterality:  Right;  AV GRAFT   PERIPHERAL VASCULAR INTERVENTION Right 05/09/2020   Procedure: PERIPHERAL VASCULAR INTERVENTION;  Surgeon: Waynetta Sandy, MD;  Location: [place] [facility];  Service: Cardiovascular;  Laterality: Right;       Family History  Problem Relation Age of Onset   Diabetes Mother     Social History   Tobacco Use   Smoking status: Never   Smokeless tobacco: Never  Vaping Use   Vaping Use: Never used  Substance  Use Topics   Alcohol use: No   Drug use: Not Currently    Types: Cocaine    Comment: none since 2009    Home Medications Prior to Admission medications   Medication Sig Start Date End Date Taking? Authorizing Provider  amLODipine (NORVASC) 5 MG tablet Take 5 mg by mouth daily. 08/15/20   [provider]  calcitRIOL (ROCALTROL) 0.25 MCG capsule Take 0.25 mcg by mouth daily. 08/15/20   [provider]  FLUoxetine (PROZAC) 10 MG capsule Take 10 mg by mouth daily. 08/15/20   [provider]  furosemide (LASIX) 40 MG tablet Take 40 mg by mouth daily.     [provider]  hydrALAZINE (APRESOLINE) 50 MG tablet Take 50 mg by mouth 3 (three) times daily.     [provider]  levETIRAcetam (KEPPRA) 500 MG tablet Take 500 mg by mouth 2 (two) times daily. 08/15/20   [provider]  metoprolol tartrate (LOPRESSOR) 25 MG tablet Take 25 mg by mouth daily.     [provider]  omeprazole (PRILOSEC) 20 MG capsule Take 20 mg by mouth daily.    [provider]  oxyCODONE (ROXICODONE) 5 MG immediate release tablet Take 1 tablet (5 mg total) by mouth every 4 (four) hours as needed. 08/29/20   Angelia Mould, MD  oxyCODONE (ROXICODONE) 5 MG immediate release tablet Take 1 tablet (5 mg total) by mouth every 4 (four) hours as needed. 08/29/20   Angelia Mould, MD  oxyCODONE-acetaminophen (PERCOCET) 5-325 MG tablet Take 1 tablet by mouth every 6 (six) hours as needed for severe pain. 02/28/21   Rosetta Posner, MD  sevelamer carbonate (RENVELA) 800 MG tablet Take 800-1,600 mg by mouth See admin instructions. 1600 mg with meals, 800 mg with snacks    [provider]    Allergies    Patient has no known allergies.  Review of Systems   Review of Systems  Unable to perform ROS: Other  Gastrointestinal:  Positive for vomiting.  Patient unwilling/unable to participate Physical Exam Updated Vital Signs BP (!) 170/118   Pulse  75   Resp 18   SpO2 95%   Physical Exam Vitals and nursing note reviewed.  Constitutional:      General: He is not in acute distress.    Appearance: He is well-developed. He is not diaphoretic.  HENT:     Head: Normocephalic and atraumatic.  Eyes:     General: No scleral icterus.    Conjunctiva/sclera: Conjunctivae normal.  Cardiovascular:     Rate and Rhythm: Normal rate and regular rhythm.     Heart sounds: Normal heart sounds.  Pulmonary:     Effort: Pulmonary effort is normal. No respiratory distress.     Breath sounds: Normal breath sounds.  Abdominal:     Palpations: Abdomen is soft.     Tenderness: There is abdominal tenderness in the right upper quadrant. There is guarding.    Musculoskeletal:     Cervical back:  Normal range of motion and neck supple.  Skin:    General: Skin is warm and dry.  Neurological:     Mental Status: He is alert.  Psychiatric:        Behavior: Behavior normal.    ED Results / Procedures / Treatments   Labs (all labs ordered are listed, but only abnormal results are displayed) Labs Reviewed  LIPASE, BLOOD - Abnormal; Notable for the following components:      Result Value   Lipase 68 (*)    All other components within normal limits  COMPREHENSIVE METABOLIC PANEL - Abnormal; Notable for the following components:   Sodium 133 (*)    Potassium 3.1 (*)    Chloride 93 (*)    Glucose, Bld 108 (*)    BUN 34 (*)    Creatinine, Ser 8.83 (*)    AST 12 (*)    GFR, Estimated 6 (*)    All other components within normal limits  CBC - Abnormal; Notable for the following components:   RBC 3.70 (*)    Hemoglobin 11.2 (*)    HCT 33.0 (*)    All other components within normal limits  URINALYSIS, ROUTINE W REFLEX MICROSCOPIC    EKG None  Radiology No results found.  Procedures Procedures   Medications Ordered in ED Medications - No data to display  ED Course  I have reviewed the triage vital signs and the nursing  notes.  Pertinent labs & imaging results that were available during my care of the patient were reviewed by me and considered in my medical decision making (see chart for details).    MDM Rules/Calculators/A&P                           Is a 60 year old male with extensive past medical history.  Patient is here for vomiting. The emergent differential diagnosis for vomiting includes, but is not limited to ACS/MI, Boerhaave's, DKA, Intracranial Hemorrhage, Ischemic bowel, Meningitis, Sepsis, Acute gastric dilation,Acetaminophen toxicity, Adrenal insufficiency, Appendicitis, Aspirin toxicity, Bowel obstruction/ileus, Carbon monoxide poisoning, Cholecystitis, CNS tumor. Digoxin toxicity, Electrolyte abnormalities, Elevated ICP, Gastric outlet obstruction,  pancreatitis, Peritonitis, Ruptured viscus, Testicular torsion,  Biliary colic, Cannabinoid hyperemesis syndrome, Chemotherapy, Disulfiram effect, Erythromycin, ETOH, Gastritis, Gastroenteritis, Gastroparesis, Hepatitis, Ibuprofen,  Labyrinthitis, Migraine, Motion sickness, Narcotic withdrawal, Thyroid, Peptic ulcer disease, Renal colic, and UTI.  I ordered and reviewed labs that included CBC with mild normocytic anemia, lipase of 68 which is unremarkable Urine shows no evidence of infection, CMP with mild hypokalemia, elevated BUN and creatinine consistent with history of end-stage renal disease on hemodialysis.  I ordered and reviewed a CT abdomen pelvis without contrast.  No evidence of acute abnormality.  Patient did have a large stool ball in the rectum which I attempted to disimpact but was unable to reach.  Patient given enema with large bowel movement.  He is not actively vomiting here in the emergency department.  Will discharge with Zofran.  Patient advised to follow closely with PCP.  He appears otherwise appropriate for discharge at this time   Final Clinical Impression(s) / ED Diagnoses Final diagnoses:  None    Rx / DC  Orders ED Discharge Orders     None        Margarita Mail, PA-C 05/22/21 1114    Sherwood Gambler, MD 05/22/21 1640

## 2021-05-21 NOTE — ED Triage Notes (Signed)
Vomiting for 3 days, denies abdominal pain

## 2021-10-26 IMAGING — DX DG CHEST 1V PORT
1 series · 1 of 1 positions shown · non-contrast
Comparison: 11/19/2017

CLINICAL DATA: Status post central venous catheter placement for
dialysis.

EXAM:
PORTABLE CHEST 1 VIEW

[chest ap]
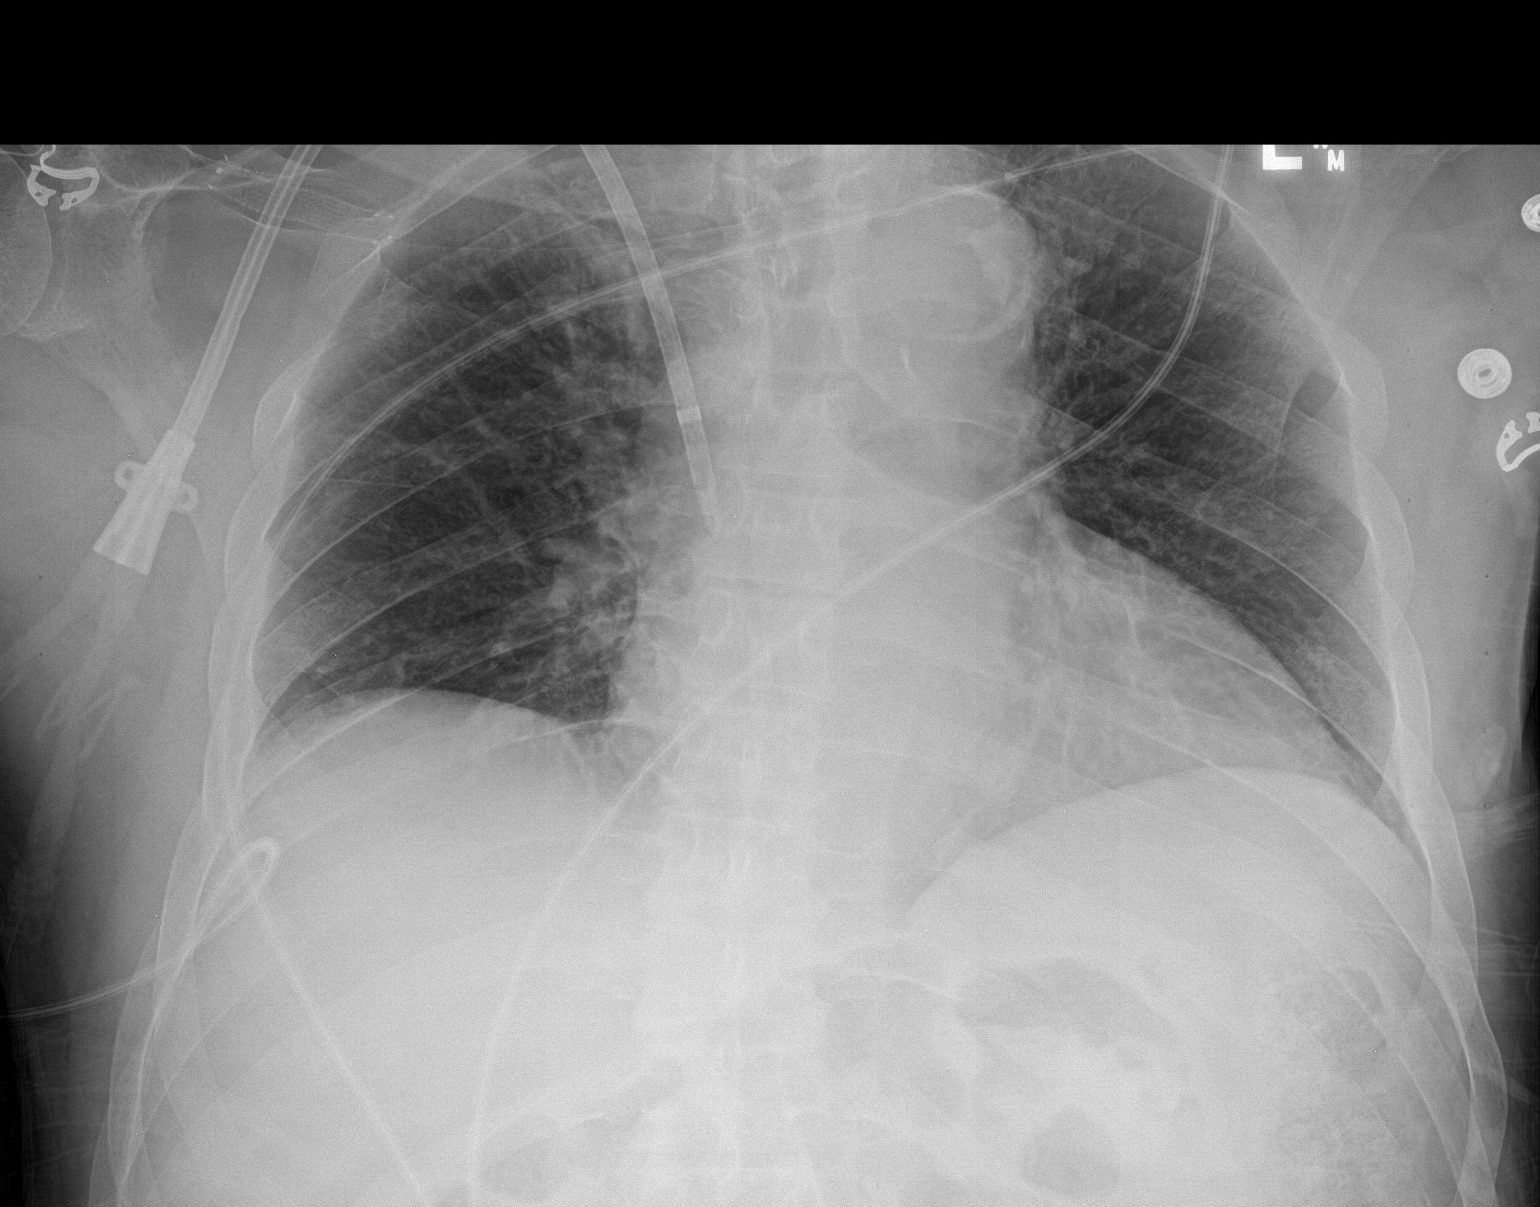

[1 of 1 positions shown; findings below may reference images not displayed]

FINDINGS: Stable cardiomegaly and low lung volumes. Aortic atherosclerotic
calcification noted.

A new right jugular dual-lumen central venous catheter is seen, with
tip overlying the superior cavoatrial junction. No evidence of
pneumothorax. Both lungs are clear.
IMPRESSION: New right jugular dual-lumen central venous catheter in appropriate
position. No evidence of pneumothorax.

Stable cardiomegaly and low lung volumes.  No active lung disease.

## 2023-01-01 ENCOUNTER — Emergency Department (HOSPITAL_COMMUNITY): Payer: Medicaid Other

## 2023-01-01 ENCOUNTER — Other Ambulatory Visit: Payer: Self-pay

## 2023-01-01 ENCOUNTER — Encounter (HOSPITAL_COMMUNITY): Payer: Self-pay | Admitting: *Deleted

## 2023-01-01 ENCOUNTER — Emergency Department (HOSPITAL_COMMUNITY)
Admission: EM | Admit: 2023-01-01 | Discharge: 2023-01-01 | Disposition: A | Discharge status: Home / Self Care | Payer: Medicaid Other | Attending: Emergency Medicine | Admitting: Emergency Medicine

## 2023-01-01 DIAGNOSIS — Z992 Dependence on renal dialysis: Secondary | ICD-10-CM | POA: Insufficient documentation

## 2023-01-01 DIAGNOSIS — Z79899 Other long term (current) drug therapy: Secondary | ICD-10-CM | POA: Insufficient documentation

## 2023-01-01 DIAGNOSIS — I120 Hypertensive chronic kidney disease with stage 5 chronic kidney disease or end stage renal disease: Secondary | ICD-10-CM | POA: Diagnosis not present

## 2023-01-01 DIAGNOSIS — N186 End stage renal disease: Secondary | ICD-10-CM | POA: Diagnosis not present

## 2023-01-01 DIAGNOSIS — R519 Headache, unspecified: Secondary | ICD-10-CM | POA: Insufficient documentation

## 2023-01-01 LAB — CBC WITH DIFFERENTIAL/PLATELET
Abs Immature Granulocytes: 0.02 10*3/uL (ref 0.00–0.07)
Basophils Absolute: 0 10*3/uL (ref 0.0–0.1)
Basophils Relative: 1 %
Eosinophils Absolute: 0.1 10*3/uL (ref 0.0–0.5)
Eosinophils Relative: 2 %
HCT: 28.4 % — ABNORMAL LOW (ref 39.0–52.0)
Hemoglobin: 9.4 g/dL — ABNORMAL LOW (ref 13.0–17.0)
Immature Granulocytes: 0 %
Lymphocytes Relative: 28 %
Lymphs Abs: 1.6 10*3/uL (ref 0.7–4.0)
MCH: 30 pg (ref 26.0–34.0)
MCHC: 33.1 g/dL (ref 30.0–36.0)
MCV: 90.7 fL (ref 80.0–100.0)
Monocytes Absolute: 0.4 10*3/uL (ref 0.1–1.0)
Monocytes Relative: 7 %
Neutro Abs: 3.6 10*3/uL (ref 1.7–7.7)
Neutrophils Relative %: 62 %
Platelets: 220 10*3/uL (ref 150–400)
RBC: 3.13 MIL/uL — ABNORMAL LOW (ref 4.22–5.81)
RDW: 13.4 % (ref 11.5–15.5)
WBC: 5.8 10*3/uL (ref 4.0–10.5)
nRBC: 0 % (ref 0.0–0.2)

## 2023-01-01 LAB — BASIC METABOLIC PANEL
Anion gap: 17 — ABNORMAL HIGH (ref 5–15)
BUN: 53 mg/dL — ABNORMAL HIGH (ref 6–20)
CO2: 22 mmol/L (ref 22–32)
Calcium: 8.8 mg/dL — ABNORMAL LOW (ref 8.9–10.3)
Chloride: 98 mmol/L (ref 98–111)
Creatinine, Ser: 12.21 mg/dL — ABNORMAL HIGH (ref 0.61–1.24)
GFR, Estimated: 4 mL/min — ABNORMAL LOW (ref >60)
Glucose, Bld: 91 mg/dL (ref 70–99)
Potassium: 4.1 mmol/L (ref 3.5–5.1)
Sodium: 137 mmol/L (ref 135–145)

## 2023-01-01 NOTE — ED Notes (Signed)
Attempted to call pt's sister Eber Jones for pt's discharge, no answer. Will attempt to call back.

## 2023-01-01 NOTE — Discharge Instructions (Signed)
Was a pleasure taking care of you today.  You are seen today for headache which is gone away.  Your CT scan of your head did not show any acute findings.  Your blood work did show that your hemoglobin has low at 9.4.  It was recently 10.2 at dialysis.  You to follow-up with your primary care doctor and kidney doctor for this.  People with chronic kidney disease often have low blood counts Make sure you call the dialysis center and make sure you go to dialysis.  Come back to the ER for any new or worsening symptoms.

## 2023-01-01 NOTE — ED Provider Notes (Signed)
EMERGENCY DEPARTMENT AT [facility] Provider Note   CSN: [id number] Arrival date & time: 01/01/23  1004     History  Chief Complaint  Patient presents with   Headache    Bryce Miller is a 62 y.o. male with past medical history of ESRD on dialysis Tuesday Thursday Saturday, paraplegic spinal paralysis, prior CVA with left-sided deficits that are chronic, history of seizures, hypertension and GERD. He presents the ER today accompanied by his sister and his nephew who help with history. History taken from patient today is that he had a headache this morning.  It was not present when he woke from sleep but gradually came on on the left side of his head the morning but pain has not resolved.   Headache      Home Medications Prior to Admission medications   Medication Sig Start Date End Date Taking? Authorizing Provider  amLODipine (NORVASC) 5 MG tablet Take 5 mg by mouth daily. 08/15/20   [provider]  calcitRIOL (ROCALTROL) 0.25 MCG capsule Take 0.25 mcg by mouth daily. Patient not taking: Reported on 05/21/2021 08/15/20   [provider]  FLUoxetine (PROZAC) 10 MG capsule Take 10 mg by mouth daily. 08/15/20   [provider]  furosemide (LASIX) 40 MG tablet Take 40 mg by mouth daily.     [provider]  hydrALAZINE (APRESOLINE) 50 MG tablet Take 50 mg by mouth 3 (three) times daily.     [provider]  levETIRAcetam (KEPPRA) 500 MG tablet Take 500 mg by mouth 2 (two) times daily. 08/15/20   [provider]  metoprolol tartrate (LOPRESSOR) 25 MG tablet Take 25 mg by mouth daily.     [provider]  omeprazole (PRILOSEC) 20 MG capsule Take 20 mg by mouth daily.    [provider]  ondansetron (ZOFRAN ODT) 4 MG disintegrating tablet 4mg  ODT q4 hours prn nausea/vomit 05/21/21   Harris, Abigail, PA-C  oxyCODONE (ROXICODONE) 5 MG immediate release tablet Take 1 tablet (5 mg total) by mouth  every 4 (four) hours as needed. Patient not taking: No sig reported 08/29/20   Chuck Hint, MD  oxyCODONE (ROXICODONE) 5 MG immediate release tablet Take 1 tablet (5 mg total) by mouth every 4 (four) hours as needed. Patient not taking: No sig reported 08/29/20   Chuck Hint, MD  oxyCODONE-acetaminophen (PERCOCET) 5-325 MG tablet Take 1 tablet by mouth every 6 (six) hours as needed for severe pain. Patient not taking: No sig reported 02/28/21   Early, Kristen Loader, MD  sevelamer carbonate (RENVELA) 800 MG tablet Take 800-1,600 mg by mouth See admin instructions. 1600 mg with meals, 800 mg with snacks Patient not taking: Reported on 05/21/2021    [provider]      Allergies    Patient has no known allergies.    Review of Systems   Review of Systems  Neurological:  Positive for headaches.    Physical Exam Updated Vital Signs BP (!) 150/89   Pulse 83   Temp 98.1 F (36.7 C) (Oral)   Resp 20   Ht 6' (1.829 m)   Wt 92.8 kg   SpO2 97%   BMI 27.75 kg/m  Physical Exam Vitals and nursing note reviewed.  Constitutional:      General: He is not in acute distress.    Appearance: He is well-developed.  HENT:     Head: Normocephalic and atraumatic.     Jaw: No trismus.  Comments: No scalp tenderness, specifically no temporal artery tenderness Eyes:     Extraocular Movements:     Right eye: Normal extraocular motion.     Left eye: Normal extraocular motion.     Conjunctiva/sclera: Conjunctivae normal.  Cardiovascular:     Rate and Rhythm: Normal rate and regular rhythm.     Heart sounds: No murmur heard. Pulmonary:     Effort: Pulmonary effort is normal. No respiratory distress.     Breath sounds: Normal breath sounds.  Abdominal:     Palpations: Abdomen is soft.     Tenderness: There is no abdominal tenderness.  Musculoskeletal:        General: No swelling.     Cervical back: Neck supple.  Skin:    General: Skin is warm and dry.     Capillary  Refill: Capillary refill takes less than 2 seconds.  Neurological:     Mental Status: He is alert and oriented to person, place, and time. Mental status is at baseline.     GCS: GCS eye subscore is 4. GCS verbal subscore is 5. GCS motor subscore is 6.  Psychiatric:        Mood and Affect: Mood normal.        Behavior: Behavior normal.     ED Results / Procedures / Treatments   Labs (all labs ordered are listed, but only abnormal results are displayed) Labs Reviewed  BASIC METABOLIC PANEL - Abnormal; Notable for the following components:      Result Value   BUN 53 (*)    Creatinine, Ser 12.21 (*)    Calcium 8.8 (*)    GFR, Estimated 4 (*)    Anion gap 17 (*)    All other components within normal limits  CBC WITH DIFFERENTIAL/PLATELET - Abnormal; Notable for the following components:   RBC 3.13 (*)    Hemoglobin 9.4 (*)    HCT 28.4 (*)    All other components within normal limits    EKG EKG Interpretation  Date/Time:  Tuesday Jan 01 2023 10:16:41 EDT Ventricular Rate:  86 PR Interval:  212 QRS Duration: 89 QT Interval:  379 QTC Calculation: 454 R Axis:   -10 Text Interpretation: Sinus rhythm Prolonged PR interval Confirmed by Gloris Manchester [phone]) on 01/01/2023 11:09:46 AM  Radiology CT Head Wo Contrast  Result Date: 01/01/2023 CLINICAL DATA:  Headache.  History of stroke. EXAM: CT HEAD WITHOUT CONTRAST TECHNIQUE: Contiguous axial images were obtained from the base of the skull through the vertex without intravenous contrast. RADIATION DOSE REDUCTION: This exam was performed according to the departmental dose-optimization program which includes automated exposure control, adjustment of the mA and/or kV according to patient size and/or use of iterative reconstruction technique. COMPARISON:  Head CT 05/24/2018 FINDINGS: Brain: There is no evidence of an acute infarct, intracranial hemorrhage, mass, midline shift, or extra-axial fluid collection. Confluent hypodensities in the  cerebral white matter bilaterally are similar to the prior study and are nonspecific but compatible with severe chronic small vessel ischemic disease. A chronic right thalamic infarct is unchanged. Mild cerebral atrophy is advanced for age. Vascular: Extensive calcified atherosclerosis at the skull base. Skull: No acute fracture or suspicious osseous lesion. Sinuses/Orbits: Visualized paranasal sinuses and mastoid air cells are clear. Unremarkable orbits. Other: None. IMPRESSION: 1. No evidence of acute intracranial abnormality. 2. Severe chronic small vessel ischemic disease. Electronically Signed   By: Sebastian Ache M.D.   On: 01/01/2023 11:49    Procedures Procedures  Medications Ordered in ED Medications - No data to display  ED Course/ Medical Decision Making/ A&P                             Medical Decision Making This patient presents to the ED for concern of headache, patient left dialysis without being dialyzed due to complaint of headache, this involves an extensive number of treatment options, and is a complaint that carries with it a high risk of complications and morbidity.  The differential diagnosis includes migraine, tension headache, Intracranial hemorrhage, cluster headache, medication overuse headache, intracranial mass, temporal arteritis, other    Co morbidities that complicate the patient evaluation :   ESRD on dialysis, prior CVA, paralysis   Additional history obtained:  Additional history obtained from EMR, dialysis chart sent with patient External records from outside source obtained and reviewed including prior ED notes, dialysis notes including recent labs( hgb 10.2 )  Lab Tests:  I Ordered, and personally interpreted labs.  The pertinent results include: CBC shows hemoglobin 9.4, compared to recent dialysis hemoglobin of 10.2   Imaging Studies ordered:  I ordered imaging studies including head CT I independently visualized and interpreted imaging which  showed no acute intracranial findings, no bleeding, old strokes redemonstrated I agree with the radiologist interpretation   Cardiac Monitoring: / EKG:  The patient was maintained on a cardiac monitor.  I personally viewed and interpreted the cardiac monitored which showed an underlying rhythm of: This rhythm    Problem List / ED Course / Critical interventions / Medication management  Patient had a headache this morning, did not dialyze due to this, no indication for emergent dialysis today.  Symptoms are resolved, he wants to go home.  Advised to follow-up with his nephrologist, advised on strict return precautions.  I have reviewed the patients home medicines and have made adjustments as needed   Social Determinants of Health:  Lives with his sister and nephew     Amount and/or Complexity of Data Reviewed Labs: ordered. Radiology: ordered.           Final Clinical Impression(s) / ED Diagnoses Final diagnoses:  Nonintractable headache, unspecified chronicity pattern, unspecified headache type    Rx / DC Orders ED Discharge Orders     None         Josem Kaufmann 01/01/23 2005    Gloris Manchester, MD 01/02/23 [phone]

## 2023-01-01 NOTE — ED Triage Notes (Signed)
Pt BIB [facility] for c/o body aches all over and headache  Pt has hx of stroke and has deficits of weakness due to stroke; pt is non-ambulatory and requires hoyer lift for positioning and transfer

## 2023-05-28 ENCOUNTER — Encounter (HOSPITAL_COMMUNITY): Payer: Self-pay | Admitting: *Deleted

## 2023-05-28 ENCOUNTER — Emergency Department (HOSPITAL_COMMUNITY): Payer: Medicaid Other

## 2023-05-28 ENCOUNTER — Emergency Department (HOSPITAL_COMMUNITY)
Admission: EM | Admit: 2023-05-28 | Discharge: 2023-05-28 | Disposition: A | Discharge status: Home / Self Care | Payer: Medicaid Other | Attending: Emergency Medicine | Admitting: Emergency Medicine

## 2023-05-28 ENCOUNTER — Other Ambulatory Visit: Payer: Self-pay

## 2023-05-28 DIAGNOSIS — D631 Anemia in chronic kidney disease: Secondary | ICD-10-CM | POA: Diagnosis not present

## 2023-05-28 DIAGNOSIS — Z8673 Personal history of transient ischemic attack (TIA), and cerebral infarction without residual deficits: Secondary | ICD-10-CM | POA: Insufficient documentation

## 2023-05-28 DIAGNOSIS — Z992 Dependence on renal dialysis: Secondary | ICD-10-CM | POA: Diagnosis not present

## 2023-05-28 DIAGNOSIS — Z79899 Other long term (current) drug therapy: Secondary | ICD-10-CM | POA: Diagnosis not present

## 2023-05-28 DIAGNOSIS — N186 End stage renal disease: Secondary | ICD-10-CM | POA: Diagnosis not present

## 2023-05-28 DIAGNOSIS — R7989 Other specified abnormal findings of blood chemistry: Secondary | ICD-10-CM | POA: Insufficient documentation

## 2023-05-28 DIAGNOSIS — I120 Hypertensive chronic kidney disease with stage 5 chronic kidney disease or end stage renal disease: Secondary | ICD-10-CM | POA: Diagnosis present

## 2023-05-28 DIAGNOSIS — I10 Essential (primary) hypertension: Secondary | ICD-10-CM

## 2023-05-28 LAB — CBC WITH DIFFERENTIAL/PLATELET
Abs Immature Granulocytes: 0.03 10*3/uL (ref 0.00–0.07)
Basophils Absolute: 0 10*3/uL (ref 0.0–0.1)
Basophils Relative: 1 %
Eosinophils Absolute: 0.1 10*3/uL (ref 0.0–0.5)
Eosinophils Relative: 2 %
HCT: 31.6 % — ABNORMAL LOW (ref 39.0–52.0)
Hemoglobin: 10.2 g/dL — ABNORMAL LOW (ref 13.0–17.0)
Immature Granulocytes: 0 %
Lymphocytes Relative: 18 %
Lymphs Abs: 1.3 10*3/uL (ref 0.7–4.0)
MCH: 29.6 pg (ref 26.0–34.0)
MCHC: 32.3 g/dL (ref 30.0–36.0)
MCV: 91.6 fL (ref 80.0–100.0)
Monocytes Absolute: 0.5 10*3/uL (ref 0.1–1.0)
Monocytes Relative: 7 %
Neutro Abs: 5 10*3/uL (ref 1.7–7.7)
Neutrophils Relative %: 72 %
Platelets: 219 10*3/uL (ref 150–400)
RBC: 3.45 MIL/uL — ABNORMAL LOW (ref 4.22–5.81)
RDW: 13.7 % (ref 11.5–15.5)
WBC: 7 10*3/uL (ref 4.0–10.5)
nRBC: 0 % (ref 0.0–0.2)

## 2023-05-28 LAB — TROPONIN I (HIGH SENSITIVITY)
Troponin I (High Sensitivity): 35 ng/L — ABNORMAL HIGH (ref <18)
Troponin I (High Sensitivity): 33 ng/L — ABNORMAL HIGH (ref <18)

## 2023-05-28 LAB — BASIC METABOLIC PANEL
Anion gap: 14 (ref 5–15)
BUN: 55 mg/dL — ABNORMAL HIGH (ref 6–20)
CO2: 22 mmol/L (ref 22–32)
Calcium: 8.9 mg/dL (ref 8.9–10.3)
Chloride: 102 mmol/L (ref 98–111)
Creatinine, Ser: 12.35 mg/dL — ABNORMAL HIGH (ref 0.61–1.24)
GFR, Estimated: 4 mL/min — ABNORMAL LOW (ref >60)
Glucose, Bld: 106 mg/dL — ABNORMAL HIGH (ref 70–99)
Potassium: 3.9 mmol/L (ref 3.5–5.1)
Sodium: 138 mmol/L (ref 135–145)

## 2023-05-28 MED ORDER — AMLODIPINE BESYLATE 5 MG PO TABS
5.0000 mg | ORAL_TABLET | Freq: Every day | ORAL | Status: DC
  Administered 2023-05-28: 5 mg via ORAL
  Filled 2023-05-28: qty 1

## 2023-05-28 MED ORDER — AMLODIPINE BESYLATE 5 MG PO TABS: 5.0000 mg | ORAL_TABLET | Freq: Every day | ORAL | Status: DC

## 2023-05-28 MED ORDER — NITROGLYCERIN 2 % TD OINT: 1.0000 [in_us] | TOPICAL_OINTMENT | Freq: Once | TRANSDERMAL | Status: DC

## 2023-05-28 MED ORDER — METOPROLOL TARTRATE 25 MG PO TABS
25.0000 mg | ORAL_TABLET | Freq: Every day | ORAL | Status: DC
  Administered 2023-05-28: 25 mg via ORAL
  Filled 2023-05-28: qty 1

## 2023-05-28 MED ORDER — METOPROLOL TARTRATE 25 MG PO TABS: 25.0000 mg | ORAL_TABLET | Freq: Every day | ORAL | Status: DC

## 2023-05-28 MED ORDER — HYDRALAZINE HCL 25 MG PO TABS
50.0000 mg | ORAL_TABLET | Freq: Three times a day (TID) | ORAL | Status: DC
  Administered 2023-05-28: 50 mg via ORAL
  Filled 2023-05-28: qty 2

## 2023-05-28 MED ORDER — HYDRALAZINE HCL 25 MG PO TABS: 50.0000 mg | ORAL_TABLET | Freq: Three times a day (TID) | ORAL | Status: DC

## 2023-05-28 NOTE — ED Provider Notes (Signed)
[facility] AT [facility] Provider Note   CSN: [id number] Arrival date & time: 05/28/23  1003     History  Chief Complaint  Patient presents with   Hypertension    Bryce Miller is a 62 y.o. male.  HPI Patient presents for hypertension.  Medical history includes ESRD, paraplegic spinal paralysis, prior CVA with chronic left-sided deficits, seizures, HTN, GERD.  Home medications include amlodipine, Lasix, hydralazine, metoprolol.  He states that he has not taken his blood pressure medications in several days.  Typically undergoes dialysis on Tuesday, Thursday, Saturday.  When he went to dialysis today, they refused to dialyze him because his blood pressure was elevated.  EMS was called to the dialysis center.  Blood pressure dialysis was reportedly 211/130.  Patient denies any current or recent symptoms.  History per family: Patient gets his medications every day, including his blood pressure medications.    Home Medications Prior to Admission medications   Medication Sig Start Date End Date Taking? Authorizing Provider  amLODipine (NORVASC) 5 MG tablet Take 5 mg by mouth daily. 08/15/20   [provider]  calcitRIOL (ROCALTROL) 0.25 MCG capsule Take 0.25 mcg by mouth daily. Patient not taking: Reported on 05/21/2021 08/15/20   [provider]  FLUoxetine (PROZAC) 10 MG capsule Take 10 mg by mouth daily. 08/15/20   [provider]  furosemide (LASIX) 40 MG tablet Take 40 mg by mouth daily.     [provider]  hydrALAZINE (APRESOLINE) 50 MG tablet Take 50 mg by mouth 3 (three) times daily.     [provider]  levETIRAcetam (KEPPRA) 500 MG tablet Take 500 mg by mouth 2 (two) times daily. 08/15/20   [provider]  metoprolol tartrate (LOPRESSOR) 25 MG tablet Take 25 mg by mouth daily.     [provider]  omeprazole (PRILOSEC) 20 MG capsule Take 20 mg by mouth daily.    [provider]  ondansetron (ZOFRAN ODT) 4 MG disintegrating tablet 4mg  ODT q4 hours prn nausea/vomit 05/21/21   Harris, Abigail, PA-C  oxyCODONE (ROXICODONE) 5 MG immediate release tablet Take 1 tablet (5 mg total) by mouth every 4 (four) hours as needed. Patient not taking: No sig reported 08/29/20   Chuck Hint, MD  oxyCODONE (ROXICODONE) 5 MG immediate release tablet Take 1 tablet (5 mg total) by mouth every 4 (four) hours as needed. Patient not taking: No sig reported 08/29/20   Chuck Hint, MD  oxyCODONE-acetaminophen (PERCOCET) 5-325 MG tablet Take 1 tablet by mouth every 6 (six) hours as needed for severe pain. Patient not taking: No sig reported 02/28/21   Early, Kristen Loader, MD  sevelamer carbonate (RENVELA) 800 MG tablet Take 800-1,600 mg by mouth See admin instructions. 1600 mg with meals, 800 mg with snacks Patient not taking: Reported on 05/21/2021    [provider]      Allergies    Patient has no known allergies.    Review of Systems   Review of Systems  Respiratory:  Negative for shortness of breath.   Cardiovascular:  Negative for chest pain.  Neurological:  Negative for dizziness, weakness, numbness and headaches.  All other systems reviewed and are negative.   Physical Exam Updated Vital Signs BP (!) 160/87 (BP Location: Right Arm)   Pulse 95   Temp 98 F (36.7 C) (Oral)   Resp 16   SpO2 96%  Physical Exam Vitals and nursing note reviewed.  Constitutional:  General: He is not in acute distress.    Appearance: Normal appearance. He is well-developed. He is not ill-appearing, toxic-appearing or diaphoretic.  HENT:     Head: Normocephalic and atraumatic.     Right Ear: External ear normal.     Left Ear: External ear normal.     Nose: Nose normal.     Mouth/Throat:     Mouth: Mucous membranes are moist.  Eyes:     Extraocular Movements: Extraocular movements intact.     Conjunctiva/sclera: Conjunctivae normal.  Cardiovascular:     Rate  and Rhythm: Normal rate and regular rhythm.     Heart sounds: No murmur heard. Pulmonary:     Effort: Pulmonary effort is normal. No respiratory distress.     Breath sounds: Normal breath sounds. No wheezing, rhonchi or rales.  Chest:     Chest wall: No tenderness.  Abdominal:     General: There is no distension.     Palpations: Abdomen is soft.     Tenderness: There is no abdominal tenderness.  Musculoskeletal:        General: No swelling or deformity.     Cervical back: Normal range of motion and neck supple.     Right lower leg: No edema.     Left lower leg: No edema.  Skin:    General: Skin is warm and dry.     Coloration: Skin is not jaundiced or pale.  Neurological:     Mental Status: He is alert and oriented to person, place, and time. Mental status is at baseline.  Psychiatric:        Mood and Affect: Mood normal.        Behavior: Behavior normal.     ED Results / Procedures / Treatments   Labs (all labs ordered are listed, but only abnormal results are displayed) Labs Reviewed  BASIC METABOLIC PANEL - Abnormal; Notable for the following components:      Result Value   Glucose, Bld 106 (*)    BUN 55 (*)    Creatinine, Ser 12.35 (*)    GFR, Estimated 4 (*)    All other components within normal limits  CBC WITH DIFFERENTIAL/PLATELET - Abnormal; Notable for the following components:   RBC 3.45 (*)    Hemoglobin 10.2 (*)    HCT 31.6 (*)    All other components within normal limits  TROPONIN I (HIGH SENSITIVITY) - Abnormal; Notable for the following components:   Troponin I (High Sensitivity) 35 (*)    All other components within normal limits  TROPONIN I (HIGH SENSITIVITY)    EKG EKG Interpretation Date/Time:  Tuesday May 28 2023 10:19:15 EDT Ventricular Rate:  94 PR Interval:  222 QRS Duration:  88 QT Interval:  364 QTC Calculation: 456 R Axis:   -57  Text Interpretation: Sinus rhythm Prolonged PR interval Left anterior fascicular block Confirmed by  Gloris Manchester ([id number]) on 05/28/2023 10:21:56 AM  Radiology DG Chest Portable 1 View  Result Date: 05/28/2023 CLINICAL DATA:  62 year old male with headache. Hypertensive at dialysis today. EXAM: PORTABLE CHEST 1 VIEW COMPARISON:  Portable chest 07/05/2020. FINDINGS: Portable AP upright view at 1032 hours. Right chest dual lumen dialysis catheter similar to that in 2021. Chronic right subclavian vascular stent. Partially visible right upper extremity vascular stent. Calcified aortic atherosclerosis. Stable mild cardiomegaly. Other mediastinal contours are within normal limits. Visualized tracheal air column is within normal limits. Allowing for portable technique the lungs are clear. No pulmonary edema or  pleural effusion. No acute osseous abnormality identified. Negative visible bowel gas. IMPRESSION: No acute cardiopulmonary abnormality. Aortic Atherosclerosis (ICD10-I70.0). Electronically Signed   By: Odessa Fleming M.D.   On: 05/28/2023 13:01   CT Head Wo Contrast  Result Date: 05/28/2023 CLINICAL DATA:  62 year old male with headache. Hypertensive at dialysis today. EXAM: CT HEAD WITHOUT CONTRAST TECHNIQUE: Contiguous axial images were obtained from the base of the skull through the vertex without intravenous contrast. RADIATION DOSE REDUCTION: This exam was performed according to the departmental dose-optimization program which includes automated exposure control, adjustment of the mA and/or kV according to patient size and/or use of iterative reconstruction technique. COMPARISON:  Head CT 01/01/2023. FINDINGS: Brain: No midline shift, ventriculomegaly, mass effect, evidence of mass lesion, intracranial hemorrhage or evidence of cortically based acute infarction. Chronic small vessel infarcts in the right deep gray nuclei, confluent chronic bilateral white matter hypodensity. Vascular: Calcified atherosclerosis at the skull base. No suspicious intracranial vascular hyperdensity. Skull: Mild motion artifact at  the vertex. No acute osseous abnormality identified. Sinuses/Orbits: Visualized paranasal sinuses and mastoids are stable and well aerated. Other: No acute orbit or scalp soft tissue finding. IMPRESSION: 1. No acute intracranial abnormality. 2. Stable non contrast CT appearance of advanced chronic small vessel disease, calcified atherosclerosis. Electronically Signed   By: Odessa Fleming M.D.   On: 05/28/2023 12:59    Procedures Procedures    Medications Ordered in ED Medications  amLODipine (NORVASC) tablet 5 mg (5 mg Oral Given 05/28/23 1135)  hydrALAZINE (APRESOLINE) tablet 50 mg (50 mg Oral Given 05/28/23 1136)  metoprolol tartrate (LOPRESSOR) tablet 25 mg (25 mg Oral Given 05/28/23 1136)    ED Course/ Medical Decision Making/ A&P                                 Medical Decision Making Amount and/or Complexity of Data Reviewed Labs: ordered. Radiology: ordered.  Risk Prescription drug management.   This patient presents to the ED for concern of hypertension, this involves an extensive number of treatment options, and is a complaint that carries with it a high risk of complications and morbidity.  The differential diagnosis includes medication nonadherence, hypertensive crisis, asymptomatic hypertension   Co morbidities that complicate the patient evaluation  ESRD, paraplegic spinal paralysis, prior CVA with chronic left-sided deficits, seizures, HTN, GERD   Additional history obtained:  Additional history obtained from EMS, patient's family External records from outside source obtained and reviewed including EMR   Lab Tests:  I Ordered, and personally interpreted labs.  The pertinent results include: Elevated creatinine and BUN consistent with ESRD, normal electrolytes, baseline anemia, no leukocytosis, very slight elevation in troponin consistent with ESRD   Imaging Studies ordered:  I ordered imaging studies including chest x-ray, CT head I independently visualized and  interpreted imaging which showed no acute findings I agree with the radiologist interpretation   Cardiac Monitoring: / EKG:  The patient was maintained on a cardiac monitor.  I personally viewed and interpreted the cardiac monitored which showed an underlying rhythm of: Sinus rhythm   Consultations Obtained:  I requested consultation with the nephrologist, Dr. Arrie Aran,  and discussed lab and imaging findings as well as pertinent plan - they recommend: No option for dialysis from the ED.  Patient to call dialysis center for possible partial session today.  If all else fails, pick up with his dialysis schedule on Thursday.   Problem List / ED  Course / Critical interventions / Medication management  Patient presenting from dialysis center for asymptomatic hypertension.  Unfortunately, dialysis center was unwilling to dialyze him today.  Patient denies any current symptoms on arrival.  On exam, he is well-appearing.  Breathing is unlabored and lungs are clear to auscultation.  He does state that he has not taken his home medications in several days.  Home blood pressure medications were ordered.  Workup was initiated to assess for any possible endorgan damage.  Blood pressures in the ED, prior to interventions, are in the 130s to 140s SBP.  Patient's lab work and imaging studies are reassuring.  He remained asymptomatic while in the ED.  He was joined by his family who does state that he gets his medicines every day.  I spoke with nephrologist on-call who stated that patient would not be able to get dialysis from the ED.  He recommends the patient reach out to his dialysis center for possible make-up session today.  Patient and family were informed of these recommendations.  Patient's brother to call dialysis center for possible partial session today.  Patient was discharged in stable condition. I ordered medication including hydralazine, metoprolol, amlodipine for hypertension Reevaluation of the  patient after these medicines showed that the patient improved I have reviewed the patients home medicines and have made adjustments as needed   Social Determinants of Health:  Bedbound at baseline, nonverbal        Final Clinical Impression(s) / ED Diagnoses Final diagnoses:  Hypertension, unspecified type    Rx / DC Orders ED Discharge Orders     None         Gloris Manchester, MD 05/28/23 1356

## 2023-05-28 NOTE — ED Triage Notes (Signed)
Pt BIB [facility] from dialysis with c/o high blood pressure; pt's BP at dialysis was 211/135 and pt has no other complaints; pt did not get dialyzed and states he has not taken his BP meds regularly  Pt has hx of previous stoke and is non-verbal

## 2023-05-28 NOTE — Discharge Instructions (Signed)
Reach out to your dialysis center for possible make-up session.  If not, pick up with your regular schedule on Thursday.  Continue home medications as prescribed.  Talk to primary care doctor about ongoing management of your hypertension.  Return to the emergency department for any new or worsening symptoms of concern.

## 2023-05-28 NOTE — ED Triage Notes (Signed)
Pt was given clonidine 0.2mg  at dialysis

## 2023-10-21 NOTE — ED Provider Notes (Addendum)
 Emergency Department Provider Note    ED Clinical Impression   Final diagnoses:  Hematuria, unspecified type (Primary)  ESRD (end stage renal disease) (CMS-HCC)    ED Assessment/Plan    History  No chief complaint on file.  HPI  2210 hrs. Patient 63 year old gentleman here for asymptomatic hematuria apparently this occurred this evening no difficulty urinating he is not anticoagulated no other medical history available he is from [place] by exams awake alert cooperative.  Cardiorespiratory general neuroexam negative abdomen benign per No obvious severe bladder distention bladder scan is pending will refer to urology locally for further diagnostic testing as outpatient  No past medical history on file.  No past surgical history on file.  No family history on file.  Social History   Socioeconomic History  . Marital status: Single    Review of Systems  Gastrointestinal:  Negative for abdominal pain.  Genitourinary:  Positive for hematuria. Negative for dysuria.  All other systems reviewed and are negative.   Physical Exam   There were no vitals taken for this visit.  Physical Exam  Vital signs have been reviewed. Patient is well-appearing w/o respiratory distress shock or major trauma. HEENT is atraumatic.  Neck shows unimpaired range of motion. Chest No increased work of breathing or audible wheezing. Cardiac Good perfusion throughout. Abdomen is not distended. Extremities are without significant trauma. Skin no visualized rash. Neuro exam is grossly non-focal. Psych exam shows normal mood and behavior.  ED Course    Gentleman's had previous CVA again supportively not anticoagulated Meds reviewed Somewhat complex medical regimen including blood pressure meds mental health medications Keppra  oxycodone .  I do not see any anticoagulation not even aspirin   Medical Decision Making   Has stage IV kidney disease also previous spinal paralysis sepsis we will  consider him for or him for additional diagnostic testing  No recent hospitalization has been seen for hypertension back in October 2024 he is in end-stage renal disease patient on dialysis.  Also seen by cardiology.  Seen previously for seizures  Additional information gentleman is Tuesday Thursday Saturday dialysis patient.  At the current time especially given this we would think it would be unlikely for him to require any kind of acute intervention for the hematuria most likely he would benefit from a referral to urology basic labs are pending at this time  2330 hrs.  Bladder scan negative for any significant retention outpatient referral numbers look good resume regular dialysis treatment evaluation which would be tomorrow Thursday Saturday.  Urology referral as outpatient      Dallara, John James, MD 10/21/23 2216    Dallara, John James, MD 10/21/23 2328

## 2023-11-20 ENCOUNTER — Ambulatory Visit (INDEPENDENT_AMBULATORY_CARE_PROVIDER_SITE_OTHER): Admitting: Urology

## 2023-11-20 ENCOUNTER — Ambulatory Visit: Admitting: Urology

## 2023-11-20 VITALS — BP 173/113 | HR 98

## 2023-11-20 DIAGNOSIS — R310 Gross hematuria: Secondary | ICD-10-CM

## 2023-11-20 DIAGNOSIS — N186 End stage renal disease: Secondary | ICD-10-CM

## 2023-11-20 LAB — URINALYSIS, ROUTINE W REFLEX MICROSCOPIC
Bilirubin, UA: NEGATIVE
Nitrite, UA: POSITIVE — AB
Specific Gravity, UA: 1.02 (ref 1.005–1.030)
Urobilinogen, Ur: 8 mg/dL — ABNORMAL HIGH (ref 0.2–1.0)
pH, UA: 8 — ABNORMAL HIGH (ref 5.0–7.5)

## 2023-11-20 LAB — MICROSCOPIC EXAMINATION: RBC, Urine: >30 /HPF — AB (ref 0–2)

## 2023-11-20 MED ORDER — CEPHALEXIN 500 MG PO CAPS: 500.0000 mg | ORAL_CAPSULE | Freq: Two times a day (BID) | ORAL | 0 refills | Status: DC

## 2023-11-20 NOTE — Progress Notes (Signed)
 Order received to complete in and out catherization for urine sample. Patient was cleaned and prepped in a sterle fashion with [id number]  A 14 fr catheter foley was inserted. Urine return was note 125 ml. Urine Bloody in color. Catheter was then removed. Patient tolerated procedure with no complications were noted.  Performed by Melvenia Stabs CMA  Urine sent for routine urinalysis

## 2023-11-20 NOTE — Progress Notes (Signed)
 11/20/2023 11:34 AM   Bryce Miller 11-Apr-1961 [id number]  Referring provider: Benetta Spar, MD [address] [place],  [place] [address]  No chief complaint on file.   HPI: 63 year old male presents today for evaluation and management of persistent intermittent gross painless hematuria.  This has been going on for a month or so.  He has a history of hypertension, end-stage renal disease, chronic hemodialysis 3 days a week, paraplegia secondary to CVA.   PMH: Past Medical History:  Diagnosis Date   Constipation    ESRD (end stage renal disease) (HCC)    Daviata- TTHS   GERD (gastroesophageal reflux disease)    Hypertension    Paraplegic spinal paralysis (HCC)    Pre-diabetes    Seizure (HCC)    Stroke (HCC)    left side weakness, memory loss    Surgical History: Past Surgical History:  Procedure Laterality Date   A/V FISTULAGRAM N/A 05/09/2020   Procedure: A/V FISTULAGRAM;  Surgeon: Maeola Harman, MD;  Location: [facility] INVASIVE CV LAB;  Service: Cardiovascular;  Laterality: N/A;   A/V SHUNTOGRAM Right 03/21/2018   Procedure: A/V SHUNTOGRAM;  Surgeon: Sherren Kerns, MD;  Location: [facility] INVASIVE CV LAB;  Service: Cardiovascular;  Laterality: Right;   AV FISTULA PLACEMENT Right 12/06/2015   Procedure: ARTERIOVENOUS (AV) FISTULA CREATION-RIGHT upper arm ;  Surgeon: Fransisco Hertz, MD;  Location: [facility] OR;  Service: Vascular;  Laterality: Right;   AV FISTULA PLACEMENT Left 02/28/2021   Procedure: LEFT ARM ARTERIOVENOUS (AV) FISTULA CREATION;  Surgeon: Larina Earthly, MD;  Location: [facility] ORS;  Service: Vascular;  Laterality: Left;   BASCILIC VEIN TRANSPOSITION Right 08/29/2020   Procedure: RIGHT FIRST STAGE BASCILIC VEIN TRANSPOSITION VERSUS ARTERIOVENOUS GRAFT;  Surgeon: Chuck Hint, MD;  Location: [facility] OR;  Service: Vascular;  Laterality: Right;   COLONOSCOPY N/A 09/30/2014   Procedure: COLONOSCOPY;  Surgeon: Corbin Ade, MD;  Location: [facility] ENDO  SUITE;  Service: Endoscopy;  Laterality: N/A;  2:30 AM - moved to 1:10 - Ginger to notify pt   CSF SHUNT     INSERTION OF DIALYSIS CATHETER Right 07/05/2020   Procedure: INSERTION OF TUNNEL  DIALYSIS CATHETER USING 19CM PRECISION CHRONIC CATHETER;  Surgeon: Chuck Hint, MD;  Location: [facility] OR;  Service: Vascular;  Laterality: Right;   IR DIALY SHUNT INTRO NEEDLE/INTRACATH INITIAL W/IMG LEFT Left 06/01/2020   IR FLUORO GUIDE CV LINE RIGHT  06/01/2020   LIGATION ARTERIOVENOUS GORTEX GRAFT Right 07/05/2020   Procedure: LIGATION EXCISION  ARTERIOVENOUS GORTEX GRAFT;  Surgeon: Chuck Hint, MD;  Location: [facility] OR;  Service: Vascular;  Laterality: Right;   PERIPHERAL VASCULAR BALLOON ANGIOPLASTY Right 03/21/2018   Procedure: PERIPHERAL VASCULAR BALLOON ANGIOPLASTY;  Surgeon: Sherren Kerns, MD;  Location: [facility] INVASIVE CV LAB;  Service: Cardiovascular;  Laterality: Right;  AV GRAFT   PERIPHERAL VASCULAR INTERVENTION Right 05/09/2020   Procedure: PERIPHERAL VASCULAR INTERVENTION;  Surgeon: Maeola Harman, MD;  Location: [facility] INVASIVE CV LAB;  Service: Cardiovascular;  Laterality: Right;    Home Medications:  Allergies as of 11/20/2023   No Known Allergies      Medication List        Accurate as of November 20, 2023 11:34 AM. If you have any questions, ask your nurse or doctor.          amLODipine 5 MG tablet Commonly known as: NORVASC Take 5 mg by mouth daily.   calcitRIOL 0.25 MCG capsule Commonly known as: ROCALTROL Take  0.25 mcg by mouth daily.   FLUoxetine 10 MG capsule Commonly known as: PROZAC Take 10 mg by mouth daily.   furosemide 40 MG tablet Commonly known as: LASIX Take 40 mg by mouth daily.   hydrALAZINE 50 MG tablet Commonly known as: APRESOLINE Take 50 mg by mouth 3 (three) times daily.   levETIRAcetam 500 MG tablet Commonly known as: KEPPRA Take 500 mg by mouth 2 (two) times daily.   metoprolol tartrate 25 MG tablet Commonly known  as: LOPRESSOR Take 25 mg by mouth daily.   omeprazole 20 MG capsule Commonly known as: PRILOSEC Take 20 mg by mouth daily.   ondansetron 4 MG disintegrating tablet Commonly known as: Zofran ODT 4mg  ODT q4 hours prn nausea/vomit   oxyCODONE 5 MG immediate release tablet Commonly known as: Roxicodone Take 1 tablet (5 mg total) by mouth every 4 (four) hours as needed.   oxyCODONE 5 MG immediate release tablet Commonly known as: Roxicodone Take 1 tablet (5 mg total) by mouth every 4 (four) hours as needed.   oxyCODONE-acetaminophen 5-325 MG tablet Commonly known as: Percocet Take 1 tablet by mouth every 6 (six) hours as needed for severe pain.   sevelamer carbonate 800 MG tablet Commonly known as: RENVELA Take 800-1,600 mg by mouth See admin instructions. 1600 mg with meals, 800 mg with snacks        Allergies: No Known Allergies  Family History: Family History  Problem Relation Age of Onset   Diabetes Mother     Social History:  reports that he has never smoked. He has never used smokeless tobacco. He reports that he does not currently use drugs after having used the following drugs: Cocaine. He reports that he does not drink alcohol.  ROS: All other review of systems were reviewed and are negative except what is noted above in HPI  Physical Exam: There were no vitals taken for this visit.  Constitutional: Alert middle-age male in wheelchair.  He can speak some.  Nods and shakes his head appropriately. HEENT: [place] AT, moist mucus membranes.  Trachea midline, no masses. Cardiovascular: No clubbing, cyanosis, or edema. Respiratory: Normal respiratory effort, no increased work of breathing. GI: No inguinal hernias GU: Normal phallus. No masses/lesions on penis, testis, scrotum.  Lymph: No cervical or inguinal lymphadenopathy. Skin: No rashes, bruises or suspicious lesions. Neurologic: Grossly intact, no focal deficits, moving all 4 extremities. Psychiatric: Normal mood  and affect.  Laboratory Data: Lab Results  Component Value Date   WBC 7.0 05/28/2023   HGB 10.2 (L) 05/28/2023   HCT 31.6 (L) 05/28/2023   MCV 91.6 05/28/2023   PLT 219 05/28/2023    Lab Results  Component Value Date   CREATININE 12.35 (H) 05/28/2023    No results found for: "PSA"  No results found for: "TESTOSTERONE"  Lab Results  Component Value Date   HGBA1C 4.4 (L) 11/20/2017    Urinalysis    Component Value Date/Time   COLORURINE YELLOW 05/21/2021 2006   APPEARANCEUR CLEAR 05/21/2021 2006   LABSPEC 1.010 05/21/2021 2006   PHURINE 7.0 05/21/2021 2006   GLUCOSEU 50 (A) 05/21/2021 2006   HGBUR MODERATE (A) 05/21/2021 2006   BILIRUBINUR NEGATIVE 05/21/2021 2006   KETONESUR NEGATIVE 05/21/2021 2006   PROTEINUR 100 (A) 05/21/2021 2006   NITRITE NEGATIVE 05/21/2021 2006   LEUKOCYTESUR NEGATIVE 05/21/2021 2006    Lab Results  Component Value Date   BACTERIA RARE (A) 05/21/2021    Emergency room records from recent emergency room visit  reviewed  No urinalysis or urine culture present from that visit  CBC/CMP from that visit reviewed  CT abdomen/pelvis scan results from October, 2022 reviewed   Results for orders placed during the hospital encounter of 10/15/13  US  Renal  Narrative CLINICAL DATA:  Renal failure  EXAM: RENAL/URINARY TRACT ULTRASOUND COMPLETE  COMPARISON:  None  FINDINGS: Right Kidney:  Length: 11.6 cm. Negative for hydronephrosis. The renal parenchyma is echogenic. There is a large simple cyst exophytic from the lower pole which measures 4.1 x 3.6 x 4.7 cm. A smaller 2.0 x 1.7 x 2.1 cm simple cyst is noted exophytic to the interpolar region.  Left Kidney:  Length: 11 cm. Negative for hydronephrosis. The renal parenchyma is echogenic.  Bladder:  Appears normal for degree of bladder distention.  IMPRESSION: 1. Negative for hydronephrosis. 2. Bilateral echogenic kidneys consistent with underlying medical renal  disease. 3. Simple renal cysts on the right.  Catheterization was performed for postvoid residual/urine specimen.  120 cc of bloody urine obtained.  Urinalysis revealed blood, nitrite and leukocyte Estrace positive   Assessment: Persistent gross painless hematuria  Possible UTI  Plan:  -Urine was cultured today, he was started on Keflex  -I will have him come back in 1 month to recheck urine.  If still hematuria, he will need cystoscopy/CT abdomen/pelvis hematuria protocol No follow-ups on file.  Roque Collar, MD  [facility] [place]

## 2023-11-22 LAB — URINE CULTURE: Organism ID, Bacteria: NO GROWTH

## 2023-11-25 ENCOUNTER — Telehealth: Payer: Self-pay

## 2023-11-25 NOTE — Telephone Encounter (Signed)
 I left a vm with Bryce Miller informing her to call back for MD response to results and to inform her of appt change.

## 2023-11-25 NOTE — Telephone Encounter (Signed)
-----   Message from Malcolm Scrivener Dahlstedt sent at 11/25/2023  7:56 AM EDT ----- Please call pt/family. Urine culture negative. He can stop abx. I'd like to see him next avail for cysto. OK to cancel appt w/ Sarah. ----- Message ----- From: Interface, [facility] Lab Results In Sent: 11/20/2023   3:36 PM EDT To: Trent Frizzle, MD

## 2024-01-06 ENCOUNTER — Ambulatory Visit: Admitting: Urology

## 2024-01-07 ENCOUNTER — Ambulatory Visit: Admitting: Urology

## 2024-01-07 DIAGNOSIS — R310 Gross hematuria: Secondary | ICD-10-CM

## 2024-02-04 ENCOUNTER — Inpatient Hospital Stay (HOSPITAL_COMMUNITY)
Admission: EM | Admit: 2024-02-04 | Discharge: 2024-02-06 | DRG: 177 | Disposition: A | Discharge status: Home / Self Care | Source: Other Acute Inpatient Hospital | Attending: Internal Medicine | Admitting: Internal Medicine

## 2024-02-04 ENCOUNTER — Encounter (HOSPITAL_COMMUNITY): Payer: Self-pay

## 2024-02-04 ENCOUNTER — Emergency Department (HOSPITAL_COMMUNITY)

## 2024-02-04 ENCOUNTER — Other Ambulatory Visit: Payer: Self-pay

## 2024-02-04 DIAGNOSIS — G40909 Epilepsy, unspecified, not intractable, without status epilepticus: Secondary | ICD-10-CM | POA: Diagnosis present

## 2024-02-04 DIAGNOSIS — G9341 Metabolic encephalopathy: Secondary | ICD-10-CM | POA: Diagnosis present

## 2024-02-04 DIAGNOSIS — Y848 Other medical procedures as the cause of abnormal reaction of the patient, or of later complication, without mention of misadventure at the time of the procedure: Secondary | ICD-10-CM | POA: Diagnosis present

## 2024-02-04 DIAGNOSIS — J690 Pneumonitis due to inhalation of food and vomit: Secondary | ICD-10-CM | POA: Diagnosis not present

## 2024-02-04 DIAGNOSIS — I160 Hypertensive urgency: Secondary | ICD-10-CM | POA: Diagnosis present

## 2024-02-04 DIAGNOSIS — I10 Essential (primary) hypertension: Secondary | ICD-10-CM

## 2024-02-04 DIAGNOSIS — D631 Anemia in chronic kidney disease: Secondary | ICD-10-CM | POA: Diagnosis present

## 2024-02-04 DIAGNOSIS — Z992 Dependence on renal dialysis: Secondary | ICD-10-CM

## 2024-02-04 DIAGNOSIS — I120 Hypertensive chronic kidney disease with stage 5 chronic kidney disease or end stage renal disease: Secondary | ICD-10-CM | POA: Diagnosis present

## 2024-02-04 DIAGNOSIS — T8242XA Displacement of vascular dialysis catheter, initial encounter: Secondary | ICD-10-CM | POA: Diagnosis present

## 2024-02-04 DIAGNOSIS — T829XXA Unspecified complication of cardiac and vascular prosthetic device, implant and graft, initial encounter: Secondary | ICD-10-CM

## 2024-02-04 DIAGNOSIS — Z8673 Personal history of transient ischemic attack (TIA), and cerebral infarction without residual deficits: Secondary | ICD-10-CM

## 2024-02-04 DIAGNOSIS — N2581 Secondary hyperparathyroidism of renal origin: Secondary | ICD-10-CM | POA: Diagnosis present

## 2024-02-04 DIAGNOSIS — N186 End stage renal disease: Secondary | ICD-10-CM | POA: Diagnosis present

## 2024-02-04 DIAGNOSIS — M898X9 Other specified disorders of bone, unspecified site: Secondary | ICD-10-CM | POA: Diagnosis present

## 2024-02-04 DIAGNOSIS — K219 Gastro-esophageal reflux disease without esophagitis: Secondary | ICD-10-CM

## 2024-02-04 DIAGNOSIS — Z79899 Other long term (current) drug therapy: Secondary | ICD-10-CM

## 2024-02-04 DIAGNOSIS — J810 Acute pulmonary edema: Principal | ICD-10-CM | POA: Diagnosis present

## 2024-02-04 DIAGNOSIS — I639 Cerebral infarction, unspecified: Secondary | ICD-10-CM

## 2024-02-04 DIAGNOSIS — J9601 Acute respiratory failure with hypoxia: Secondary | ICD-10-CM | POA: Diagnosis present

## 2024-02-04 DIAGNOSIS — F0390 Unspecified dementia without behavioral disturbance: Secondary | ICD-10-CM | POA: Diagnosis present

## 2024-02-04 DIAGNOSIS — R569 Unspecified convulsions: Secondary | ICD-10-CM

## 2024-02-04 LAB — RESP PANEL BY RT-PCR (RSV, FLU A&B, COVID)  RVPGX2
Influenza A by PCR: NEGATIVE
Influenza B by PCR: NEGATIVE
Resp Syncytial Virus by PCR: NEGATIVE
SARS Coronavirus 2 by RT PCR: NEGATIVE

## 2024-02-04 LAB — COMPREHENSIVE METABOLIC PANEL WITH GFR
ALT: 26 U/L (ref 0–44)
AST: 17 U/L (ref 15–41)
Albumin: 3.5 g/dL (ref 3.5–5.0)
Alkaline Phosphatase: 54 U/L (ref 38–126)
Anion gap: 13 (ref 5–15)
BUN: 52 mg/dL — ABNORMAL HIGH (ref 8–23)
CO2: 25 mmol/L (ref 22–32)
Calcium: 9.3 mg/dL (ref 8.9–10.3)
Chloride: 102 mmol/L (ref 98–111)
Creatinine, Ser: 11.56 mg/dL — ABNORMAL HIGH (ref 0.61–1.24)
GFR, Estimated: 5 mL/min — ABNORMAL LOW (ref >60)
Glucose, Bld: 92 mg/dL (ref 70–99)
Potassium: 4.2 mmol/L (ref 3.5–5.1)
Sodium: 140 mmol/L (ref 135–145)
Total Bilirubin: 0.9 mg/dL (ref 0.0–1.2)
Total Protein: 7.4 g/dL (ref 6.5–8.1)

## 2024-02-04 LAB — LIPASE, BLOOD: Lipase: 57 U/L — ABNORMAL HIGH (ref 11–51)

## 2024-02-04 LAB — URINALYSIS, ROUTINE W REFLEX MICROSCOPIC
Bilirubin Urine: NEGATIVE
Glucose, UA: 50 mg/dL — AB
Hgb urine dipstick: NEGATIVE
Ketones, ur: NEGATIVE mg/dL
Leukocytes,Ua: NEGATIVE
Nitrite: NEGATIVE
Protein, ur: 100 mg/dL — AB
Specific Gravity, Urine: 1.008 (ref 1.005–1.030)
pH: 8 (ref 5.0–8.0)

## 2024-02-04 LAB — MRSA NEXT GEN BY PCR, NASAL: MRSA by PCR Next Gen: NOT DETECTED

## 2024-02-04 LAB — CBC WITH DIFFERENTIAL/PLATELET
Abs Immature Granulocytes: 0.02 10*3/uL (ref 0.00–0.07)
Basophils Absolute: 0 10*3/uL (ref 0.0–0.1)
Basophils Relative: 1 %
Eosinophils Absolute: 0.1 10*3/uL (ref 0.0–0.5)
Eosinophils Relative: 1 %
HCT: 35 % — ABNORMAL LOW (ref 39.0–52.0)
Hemoglobin: 11.7 g/dL — ABNORMAL LOW (ref 13.0–17.0)
Immature Granulocytes: 0 %
Lymphocytes Relative: 17 %
Lymphs Abs: 1 10*3/uL (ref 0.7–4.0)
MCH: 29.3 pg (ref 26.0–34.0)
MCHC: 33.4 g/dL (ref 30.0–36.0)
MCV: 87.7 fL (ref 80.0–100.0)
Monocytes Absolute: 0.4 10*3/uL (ref 0.1–1.0)
Monocytes Relative: 6 %
Neutro Abs: 4.7 10*3/uL (ref 1.7–7.7)
Neutrophils Relative %: 75 %
Platelets: 218 10*3/uL (ref 150–400)
RBC: 3.99 MIL/uL — ABNORMAL LOW (ref 4.22–5.81)
RDW: 14.2 % (ref 11.5–15.5)
WBC: 6.2 10*3/uL (ref 4.0–10.5)
nRBC: 0 % (ref 0.0–0.2)

## 2024-02-04 LAB — TROPONIN I (HIGH SENSITIVITY)
Troponin I (High Sensitivity): 55 ng/L — ABNORMAL HIGH (ref <18)
Troponin I (High Sensitivity): 54 ng/L — ABNORMAL HIGH (ref <18)

## 2024-02-04 LAB — LACTIC ACID, PLASMA
Lactic Acid, Venous: 1 mmol/L (ref 0.5–1.9)
Lactic Acid, Venous: 0.9 mmol/L (ref 0.5–1.9)

## 2024-02-04 LAB — STREP PNEUMONIAE URINARY ANTIGEN: Strep Pneumo Urinary Antigen: NEGATIVE

## 2024-02-04 LAB — MAGNESIUM: Magnesium: 2.1 mg/dL (ref 1.7–2.4)

## 2024-02-04 MED ORDER — LEVETIRACETAM 500 MG PO TABS
500.0000 mg | ORAL_TABLET | Freq: Two times a day (BID) | ORAL | Status: DC
  Administered 2024-02-04 – 2024-02-06 (×4): 500 mg via ORAL
  Filled 2024-02-04 (×4): qty 1

## 2024-02-04 MED ORDER — FUROSEMIDE 40 MG PO TABS
40.0000 mg | ORAL_TABLET | Freq: Every day | ORAL | Status: DC
Start: 2024-02-04 — End: 2024-02-07
  Administered 2024-02-04 – 2024-02-06 (×3): 40 mg via ORAL
  Filled 2024-02-04 (×3): qty 1

## 2024-02-04 MED ORDER — ONDANSETRON HCL 4 MG PO TABS: 4.0000 mg | ORAL_TABLET | Freq: Four times a day (QID) | ORAL | Status: DC | PRN

## 2024-02-04 MED ORDER — ONDANSETRON HCL 4 MG/2ML IJ SOLN
4.0000 mg | Freq: Four times a day (QID) | INTRAMUSCULAR | Status: DC | PRN
Start: 2024-02-04 — End: 2024-02-07

## 2024-02-04 MED ORDER — SODIUM CHLORIDE 0.9 % IV SOLN
1.0000 g | Freq: Once | INTRAVENOUS | Status: AC
  Administered 2024-02-04: 1 g via INTRAVENOUS
  Filled 2024-02-04: qty 10

## 2024-02-04 MED ORDER — HYDRALAZINE HCL 20 MG/ML IJ SOLN
10.0000 mg | Freq: Once | INTRAMUSCULAR | Status: AC
  Administered 2024-02-04: 10 mg via INTRAVENOUS
  Filled 2024-02-04: qty 1

## 2024-02-04 MED ORDER — HEPARIN SODIUM (PORCINE) 5000 UNIT/ML IJ SOLN
5000.0000 [IU] | Freq: Three times a day (TID) | INTRAMUSCULAR | Status: DC
  Administered 2024-02-04 – 2024-02-06 (×6): 5000 [IU] via SUBCUTANEOUS
  Filled 2024-02-04 (×6): qty 1

## 2024-02-04 MED ORDER — FLUOXETINE HCL 10 MG PO CAPS
10.0000 mg | ORAL_CAPSULE | Freq: Every day | ORAL | Status: DC
  Administered 2024-02-04 – 2024-02-06 (×3): 10 mg via ORAL
  Filled 2024-02-04 (×3): qty 1

## 2024-02-04 MED ORDER — CLONIDINE HCL 0.1 MG/24HR TD PTWK
0.1000 mg | MEDICATED_PATCH | TRANSDERMAL | Status: DC
  Administered 2024-02-05: 0.1 mg via TRANSDERMAL
  Filled 2024-02-04: qty 1

## 2024-02-04 MED ORDER — LABETALOL HCL 5 MG/ML IV SOLN
10.0000 mg | INTRAVENOUS | Status: DC | PRN
  Administered 2024-02-04 – 2024-02-06 (×4): 10 mg via INTRAVENOUS
  Filled 2024-02-04 (×4): qty 4

## 2024-02-04 MED ORDER — AMLODIPINE BESYLATE 5 MG PO TABS
5.0000 mg | ORAL_TABLET | Freq: Every day | ORAL | Status: DC
  Administered 2024-02-05 – 2024-02-06 (×2): 5 mg via ORAL
  Filled 2024-02-04 (×2): qty 1

## 2024-02-04 MED ORDER — ACETAMINOPHEN 650 MG RE SUPP: 650.0000 mg | Freq: Four times a day (QID) | RECTAL | Status: DC | PRN

## 2024-02-04 MED ORDER — AMLODIPINE BESYLATE 5 MG PO TABS
5.0000 mg | ORAL_TABLET | Freq: Once | ORAL | Status: AC
  Administered 2024-02-04: 5 mg via ORAL
  Filled 2024-02-04: qty 1

## 2024-02-04 MED ORDER — NICARDIPINE HCL IN NACL 20-0.86 MG/200ML-% IV SOLN
3.0000 mg/h | INTRAVENOUS | Status: DC
  Administered 2024-02-04: 5 mg/h via INTRAVENOUS
  Administered 2024-02-05: 3 mg/h via INTRAVENOUS
  Administered 2024-02-06: 5 mg/h via INTRAVENOUS
  Filled 2024-02-04 (×4): qty 200

## 2024-02-04 MED ORDER — SODIUM CHLORIDE 0.9 % IV SOLN
2.0000 g | INTRAVENOUS | Status: DC
  Administered 2024-02-05 – 2024-02-06 (×2): 2 g via INTRAVENOUS
  Filled 2024-02-04 (×2): qty 20

## 2024-02-04 MED ORDER — PANTOPRAZOLE SODIUM 40 MG PO TBEC
40.0000 mg | DELAYED_RELEASE_TABLET | Freq: Every day | ORAL | Status: DC
  Administered 2024-02-04 – 2024-02-06 (×3): 40 mg via ORAL
  Filled 2024-02-04 (×3): qty 1

## 2024-02-04 MED ORDER — SODIUM CHLORIDE 0.9 % IV SOLN
500.0000 mg | INTRAVENOUS | Status: DC
  Administered 2024-02-04 – 2024-02-06 (×3): 500 mg via INTRAVENOUS
  Filled 2024-02-04 (×3): qty 5

## 2024-02-04 MED ORDER — HYDRALAZINE HCL 50 MG PO TABS
100.0000 mg | ORAL_TABLET | Freq: Two times a day (BID) | ORAL | Status: DC
  Administered 2024-02-04 – 2024-02-06 (×4): 100 mg via ORAL
  Filled 2024-02-04 (×4): qty 2

## 2024-02-04 MED ORDER — ACETAMINOPHEN 325 MG PO TABS: 650.0000 mg | ORAL_TABLET | Freq: Four times a day (QID) | ORAL | Status: DC | PRN

## 2024-02-04 MED ORDER — METOPROLOL SUCCINATE ER 50 MG PO TB24
50.0000 mg | ORAL_TABLET | Freq: Every day | ORAL | Status: DC
  Administered 2024-02-04 – 2024-02-06 (×3): 50 mg via ORAL
  Filled 2024-02-04 (×3): qty 1

## 2024-02-04 MED ORDER — HYDRALAZINE HCL 50 MG PO TABS
50.0000 mg | ORAL_TABLET | Freq: Two times a day (BID) | ORAL | Status: DC
  Administered 2024-02-04 – 2024-02-06 (×4): 50 mg via ORAL
  Filled 2024-02-04 (×4): qty 1

## 2024-02-04 MED ORDER — LOSARTAN POTASSIUM 50 MG PO TABS
50.0000 mg | ORAL_TABLET | Freq: Every day | ORAL | Status: DC
  Administered 2024-02-04 – 2024-02-06 (×3): 50 mg via ORAL
  Filled 2024-02-04: qty 2
  Filled 2024-02-04 (×2): qty 1

## 2024-02-04 MED ORDER — CHLORHEXIDINE GLUCONATE CLOTH 2 % EX PADS
6.0000 | MEDICATED_PAD | Freq: Every day | CUTANEOUS | Status: DC
  Administered 2024-02-05 – 2024-02-06 (×2): 6 via TOPICAL

## 2024-02-04 NOTE — H&P (Signed)
 History and Physical    Bryce Miller FMW:[id number] DOB: Apr 29, 1961 DOA: 02/04/2024  PCP: Carlette Benita Area, MD  Patient coming from: Dialysis center  I have personally briefly reviewed patient's old medical records in [facility]  Chief Complaint: Vomiting  HPI: Bryce Miller is a 63 y.o. male with medical history significant of ESRD, dialysis dependent on TTS schedule, prior CVA with left-sided spinal paralysis, history of hypertension, seizure disorder, hypertension, GERD was sent to the ED from dialysis center after he was noted to have vomited and concern for possible aspiration. Apparently patient's oxygen saturation was 88% when EMS arrived at the dialysis center, he was placed on 4 L nasal cannula.  According to ED physician, patient endorses feeling fine yesterday but woke up today not feeling good and states he feels cold.  However during my encounter, patient appears to be very poor historian.  He is often not responding to any questions until asked 2-3 times and very loud.  Patient is only able to tell me his name and that he is in the hospital but unable to tell me which hospital, month or the year.  Per ED RN, patient lives with his sister and nephew.  And per her, the sister herself was very hard of hearing so nephew was the one who was answering some questions.  However none of those are present during my encounter.  I was informed by RN that patient has been confused lately for couple of weeks.  He does have some dementia at baseline apparently.  I am unsure if his confusion now is worse than his baseline or not.  In order to get some more history, I initially tried to call patient's nephew Terrel, the phone went to the voicemail directly and voicemail box was not set up.  I then called patient's Sister Aleck, nobody picked up that phone and no way to leave voicemail either.  Patient appears comfortable.  He was eating his dinner and was taking long time chewing.  I asked  nurse tech to remove his food and keep him n.p.o. until he is seen by SLP.  ED Course: Upon arrival to ED, he was in hypertensive urgency with hypoxia requiring some oxygen.  Chest x-ray showed possibility of left lower lobe consolidation.  Patient was given Rocephin in the ED.  CBC shows no leukocytosis, chronic anemia which is stable.  BMP shows elevated creatinine but stable due to ESRD.  Lactic acid was normal as well.  Magnesium  was normal.  Blood culture drawn.  Hospitalist were called for admission.  Review of Systems: As per HPI otherwise negative.    Past Medical History:  Diagnosis Date   Constipation    ESRD (end stage renal disease) (HCC)    Davi[place]- TTHS   GERD (gastroesophageal reflux disease)    Hypertension    Paraplegic spinal paralysis (HCC)    Pre-diabetes    Seizure (HCC)    Stroke (HCC)    left side weakness, memory loss    Past Surgical History:  Procedure Laterality Date   A/V FISTULAGRAM N/A 05/09/2020   Procedure: A/V FISTULAGRAM;  Surgeon: Sheree Penne Bruckner, MD;  Location: [facility] INVASIVE CV LAB;  Service: Cardiovascular;  Laterality: N/A;   A/V SHUNTOGRAM Right 03/21/2018   Procedure: A/V SHUNTOGRAM;  Surgeon: Harvey Carlin BRAVO, MD;  Location: [facility] INVASIVE CV LAB;  Service: Cardiovascular;  Laterality: Right;   AV FISTULA PLACEMENT Right 12/06/2015   Procedure: ARTERIOVENOUS (AV) FISTULA CREATION-RIGHT upper arm ;  Surgeon:  Redell LITTIE Door, MD;  Location: [facility] OR;  Service: Vascular;  Laterality: Right;   AV FISTULA PLACEMENT Left 02/28/2021   Procedure: LEFT ARM ARTERIOVENOUS (AV) FISTULA CREATION;  Surgeon: Oris Krystal FALCON, MD;  Location: [facility] ORS;  Service: Vascular;  Laterality: Left;   BASCILIC VEIN TRANSPOSITION Right 08/29/2020   Procedure: RIGHT FIRST STAGE BASCILIC VEIN TRANSPOSITION VERSUS ARTERIOVENOUS GRAFT;  Surgeon: Eliza Lonni RAMAN, MD;  Location: [facility] OR;  Service: Vascular;  Laterality: Right;   COLONOSCOPY N/A 09/30/2014   Procedure: COLONOSCOPY;   Surgeon: Lamar CHRISTELLA Hollingshead, MD;  Location: [facility] ENDO SUITE;  Service: Endoscopy;  Laterality: N/A;  2:30 AM - moved to 1:10 - Ginger to notify pt   CSF SHUNT     INSERTION OF DIALYSIS CATHETER Right 07/05/2020   Procedure: INSERTION OF TUNNEL  DIALYSIS CATHETER USING 19CM PRECISION CHRONIC CATHETER;  Surgeon: Eliza Lonni RAMAN, MD;  Location: [facility] OR;  Service: Vascular;  Laterality: Right;   IR DIALY SHUNT INTRO NEEDLE/INTRACATH INITIAL W/IMG LEFT Left 06/01/2020   IR FLUORO GUIDE CV LINE RIGHT  06/01/2020   LIGATION ARTERIOVENOUS GORTEX GRAFT Right 07/05/2020   Procedure: LIGATION EXCISION  ARTERIOVENOUS GORTEX GRAFT;  Surgeon: Eliza Lonni RAMAN, MD;  Location: [facility] OR;  Service: Vascular;  Laterality: Right;   PERIPHERAL VASCULAR BALLOON ANGIOPLASTY Right 03/21/2018   Procedure: PERIPHERAL VASCULAR BALLOON ANGIOPLASTY;  Surgeon: Harvey Carlin BRAVO, MD;  Location: [facility] INVASIVE CV LAB;  Service: Cardiovascular;  Laterality: Right;  AV GRAFT   PERIPHERAL VASCULAR INTERVENTION Right 05/09/2020   Procedure: PERIPHERAL VASCULAR INTERVENTION;  Surgeon: Sheree Penne Lonni, MD;  Location: [facility] INVASIVE CV LAB;  Service: Cardiovascular;  Laterality: Right;     reports that he has never smoked. He has never used smokeless tobacco. He reports that he does not currently use drugs after having used the following drugs: Cocaine. He reports that he does not drink alcohol.  No Known Allergies  Family History  Problem Relation Age of Onset   Diabetes Mother     Prior to Admission medications   Medication Sig Start Date End Date Taking? Authorizing Provider  amLODipine  (NORVASC ) 5 MG tablet Take 5 mg by mouth daily. 08/15/20  Yes [provider]  cloNIDine (CATAPRES - DOSED IN MG/24 HR) 0.1 mg/24hr patch Place 0.1 mg onto the skin once a week. 01/21/24  Yes [provider]  FLUoxetine  (PROZAC ) 10 MG capsule Take 10 mg by mouth daily. 08/15/20  Yes [provider]  furosemide   (LASIX ) 40 MG tablet Take 40 mg by mouth daily.    Yes [provider]  hydrALAZINE  (APRESOLINE ) 50 MG tablet Take 50 mg by mouth 2 (two) times daily.   Yes [provider]  levETIRAcetam  (KEPPRA ) 500 MG tablet Take 500 mg by mouth 2 (two) times daily. 08/15/20  Yes [provider]  losartan (COZAAR) 100 MG tablet Take 50 mg by mouth daily. 11/15/23  Yes [provider]  metoprolol  succinate (TOPROL -XL) 50 MG 24 hr tablet Take 50 mg by mouth daily. 12/31/23  Yes [provider]  omeprazole (PRILOSEC) 20 MG capsule Take 20 mg by mouth daily.   Yes [provider]  sevelamer carbonate (RENVELA) 800 MG tablet Take 800-1,600 mg by mouth See admin instructions. 1600 mg with meals, 800 mg with snacks   Yes [provider]  hydrALAZINE  (APRESOLINE ) 100 MG tablet Take 100 mg by mouth 2 (two) times daily. Patient not taking: Reported on 02/04/2024 01/02/24   [provider]  Physical Exam: Vitals:   02/04/24 1735 02/04/24 1800 02/04/24 1915 02/04/24 1916  BP: (!) 190/103 (!) 164/92    Pulse: 98 100    Resp:      Temp:    98.3 F (36.8 C)  TempSrc:    Oral  SpO2: 90% 92%    Weight:    88.5 kg  Height:   5' 11 (1.803 m)     Constitutional: NAD, calm, comfortable Vitals:   02/04/24 1735 02/04/24 1800 02/04/24 1915 02/04/24 1916  BP: (!) 190/103 (!) 164/92    Pulse: 98 100    Resp:      Temp:    98.3 F (36.8 C)  TempSrc:    Oral  SpO2: 90% 92%    Weight:    88.5 kg  Height:   5' 11 (1.803 m)    Eyes: PERRL, lids and conjunctivae normal ENMT: Mucous membranes are moist. Posterior pharynx clear of any exudate or lesions.Normal dentition.  Neck: normal, supple, no masses, no thyromegaly Respiratory:  coarse breath sounds with bibasilar crackles and some expiratory wheezes. Cardiovascular: Regular rate and rhythm, no murmurs / rubs / gallops. No extremity edema. 2+ pedal pulses. No carotid bruits.  Abdomen: no  tenderness, no masses palpated. No hepatosplenomegaly. Bowel sounds positive.  Musculoskeletal: no clubbing / cyanosis. No joint deformity upper and lower extremities. Good ROM, no contractures. Normal muscle tone.  Skin: no rashes, lesions, ulcers. No induration  Labs on Admission: I have personally reviewed following labs and imaging studies  CBC: Recent Labs  Lab 02/04/24 1029  WBC 6.2  NEUTROABS 4.7  HGB 11.7*  HCT 35.0*  MCV 87.7  PLT 218   Basic Metabolic Panel: Recent Labs  Lab 02/04/24 1029 02/04/24 1248  NA 140  --   K 4.2  --   CL 102  --   CO2 25  --   GLUCOSE 92  --   BUN 52*  --   CREATININE 11.56*  --   CALCIUM  9.3  --   MG  --  2.1   GFR: Estimated Creatinine Clearance: 7.1 mL/min (A) (by C-G formula based on SCr of 11.56 mg/dL (H)). Liver Function Tests: Recent Labs  Lab 02/04/24 1029  AST 17  ALT 26  ALKPHOS 54  BILITOT 0.9  PROT 7.4  ALBUMIN 3.5   Recent Labs  Lab 02/04/24 1029  LIPASE 57*   No results for input(s): AMMONIA in the last 168 hours. Coagulation Profile: No results for input(s): INR, PROTIME in the last 168 hours. Cardiac Enzymes: No results for input(s): CKTOTAL, CKMB, CKMBINDEX, TROPONINI in the last 168 hours. BNP (last 3 results) No results for input(s): PROBNP in the last 8760 hours. HbA1C: No results for input(s): HGBA1C in the last 72 hours. CBG: No results for input(s): GLUCAP in the last 168 hours. Lipid Profile: No results for input(s): CHOL, HDL, LDLCALC, TRIG, CHOLHDL, LDLDIRECT in the last 72 hours. Thyroid Function Tests: No results for input(s): TSH, T4TOTAL, FREET4, T3FREE, THYROIDAB in the last 72 hours. Anemia Panel: No results for input(s): VITAMINB12, FOLATE, FERRITIN, TIBC, IRON, RETICCTPCT in the last 72 hours. Urine analysis:    Component Value Date/Time   COLORURINE STRAW (A) 02/04/2024 1808   APPEARANCEUR CLEAR 02/04/2024 1808    APPEARANCEUR Cloudy (A) 11/20/2023 1315   LABSPEC 1.008 02/04/2024 1808   PHURINE 8.0 02/04/2024 1808   GLUCOSEU 50 (A) 02/04/2024 1808   HGBUR NEGATIVE 02/04/2024 1808   BILIRUBINUR NEGATIVE 02/04/2024 1808  BILIRUBINUR Negative 11/20/2023 1315   KETONESUR NEGATIVE 02/04/2024 1808   PROTEINUR 100 (A) 02/04/2024 1808   NITRITE NEGATIVE 02/04/2024 1808   LEUKOCYTESUR NEGATIVE 02/04/2024 1808    Radiological Exams on Admission: DG Chest Portable 1 View Result Date: 02/04/2024 CLINICAL DATA:  Emesis, hypoxia, concern for aspiration EXAM: PORTABLE CHEST 1 VIEW COMPARISON:  05/28/2023 FINDINGS: Single frontal view of the chest demonstrates right internal jugular dialysis catheter unchanged. Cardiac silhouette remains enlarged. Overall worsening volume status, with increased central pulmonary vascular congestion and diffuse interstitial and ground-glass opacities consistent with pulmonary edema. Streaky retrocardiac consolidation could reflect airspace disease or atelectasis. No effusion or pneumothorax. IMPRESSION: 1. Overall findings consistent with worsening volume status and developing pulmonary edema. 2. Streaky left lower lobe consolidation in the retrocardiac region could reflect aspiration given clinical presentation, pneumonia, or atelectasis. Electronically Signed   By: Ozell Daring M.D.   On: 02/04/2024 10:30    EKG: Independently reviewed. Sinus rhythm Borderline prolonged PR interval Left anterior fascicular block  Assessment/Plan Principal Problem:   Aspiration pneumonia (HCC)   Acute hypoxic respiratory failure secondary to aspiration pneumonia: Chest x-ray shows left lower lobe consolidation, history of witnessed emesis.  Patient given Rocephin in the ED.  I will continue that and add azithromycin.  Follow blood culture.  Check urine antigen for Legionella and streptococci as well as sputum culture.  Wean oxygen as able to.  Hypertensive urgency: History of hypertension.   Blood pressure upon arrival was 187/116.  Much improved, 170/125 as the recent reading.  Patient has been given amlodipine  5 mg in the ED.  He takes amlodipine  5 mg at home which I will resume.  Will also resume rest of the home medications includes clonidine patch, Lasix  40 mg daily, hydralazine  100 mg twice daily, losartan 50 mg p.o. daily, Toprol -XL 50 mg p.o. daily.  Will place him on as needed labetalol .  Acute pulmonary edema: Chest x-ray shows pulmonary edema.  No echo in the chart, unsure if he has any congestive heart failure history.  Will check echo.  Resume his Lasix  as apparently he produces some urine.  Dialysis will be needed for volume management.  Acute metabolic encephalopathy vs dementia: Patient alert and oriented to self and place, he is able to tell me he is in the hospital but unable to tell me the name.  Unable to answer the month or the year.  As mentioned above, per sister who mention it to the RN, patient has been confused.  I could not confirm this with the family, could not get in touch with them.  Has prior history of a stroke so I wonder if this is his baseline.  Reassess in the morning.  ESRD on HD: TTS schedule.  Per nurse who spoke to the dialysis center, the dialysis was not completed today.  Per EDP, they consulted with nephrology and tried to do the dialysis today however dialysis center in the hospital does not have any more capacity today, per nephrology, patient will be dialyzed tomorrow morning.  GERD: Continue PPI.  DVT prophylaxis: heparin  injection 5,000 Units Start: 02/04/24 2200 Code Status: Full code by default as I am unable to confirm his CODE STATUS with his family and patient unable to respond well as well. Family Communication: None present at bedside.   Disposition Plan: Discharge in 1 to 2 days once medically stable. Consults called: Nephrology.  Fredia Skeeter MD [facility]  *Please note that this is a verbal dictation therefore any  spelling or grammatical errors are due to the [employer] system interpretation.  Please page via Amion and do not message via secure chat for urgent patient care matters. Secure chat can be used for non urgent patient care matters. 02/04/2024, 7:37 PM  To contact the attending provider between 7A-7P or the covering provider during after hours 7P-7A, please log into the web site [email or web address]

## 2024-02-04 NOTE — ED Notes (Signed)
 Dr Melia with Nephro notified that patient dislodged his hemodialysis catheter and sutures came out and catheter is dislodged by 2 inches approx.  Taped down to secure it so it does not come out any more.  Patient is in mitts to help prevent pulling.

## 2024-02-04 NOTE — ED Triage Notes (Addendum)
 Patient picked up from [facility] after 1 episode of vomiting and afraid he aspirated.  EMS reports initial sats 88% placed on 4L [place].  Patient reports he is cold and doesn't know why he vomited.  [facility] seems to think he is septic.  Patient had previous stroke with left sided weakness and left arm contracted.  Patient slow to speak  Per hisotyr patient is a paraplegic and hx of stroke. Patient did not get any dialysis.

## 2024-02-04 NOTE — ED Notes (Signed)
 Lab to come and get repeat trop

## 2024-02-04 NOTE — ED Notes (Signed)
 Patient will not lie still and is fidgeting with all the cords and sliding down in the bed.  Update vitals and spoke with Charge of 300 Ronal Caldron and she is reaching out to the MD.

## 2024-02-04 NOTE — ED Provider Notes (Signed)
 [facility]ERGENCY DEPARTMENT AT [facility] Provider Note   CSN: [id number] Arrival date & time: 02/04/24  9098     Patient presents with: Emesis   Bryce Miller is a 63 y.o. male with history including end-stage renal disease with dialysis Tuesday Thursday Saturday, history of CVA with left-sided spinal paralysis, history of hypertension and seizure disorder presenting from his dialysis center secondary to 1 episode of emesis and concern for possible aspiration.  Apparently patient's oxygen saturation was 88% when EMS arrived at the dialysis center, he was placed on 4 L nasal cannula.  Patient endorses feeling fine yesterday but woke up today not feeling good and states he feels cold.  He does have a history of sepsis, denies fevers.  Denies chest pain, shortness of breath, no current nausea although he does endorse left-sided abdominal pain on my exam.  No diarrhea.  He does make a small amount of urine.  He did not undergo dialysis today.   The history is provided by the patient and medical records.       Prior to Admission medications   Medication Sig Start Date End Date Taking? Authorizing Provider  amLODipine  (NORVASC ) 5 MG tablet Take 5 mg by mouth daily. 08/15/20  Yes [provider]  cloNIDine (CATAPRES - DOSED IN MG/24 HR) 0.1 mg/24hr patch Place 0.1 mg onto the skin once a week. 01/21/24  Yes [provider]  FLUoxetine  (PROZAC ) 10 MG capsule Take 10 mg by mouth daily. 08/15/20  Yes [provider]  furosemide  (LASIX ) 40 MG tablet Take 40 mg by mouth daily.    Yes [provider]  hydrALAZINE  (APRESOLINE ) 50 MG tablet Take 50 mg by mouth 2 (two) times daily.   Yes [provider]  levETIRAcetam  (KEPPRA ) 500 MG tablet Take 500 mg by mouth 2 (two) times daily. 08/15/20  Yes [provider]  losartan (COZAAR) 100 MG tablet Take 50 mg by mouth daily. 11/15/23  Yes [provider]  metoprolol  succinate  (TOPROL -XL) 50 MG 24 hr tablet Take 50 mg by mouth daily. 12/31/23  Yes [provider]  omeprazole (PRILOSEC) 20 MG capsule Take 20 mg by mouth daily.   Yes [provider]  sevelamer carbonate (RENVELA) 800 MG tablet Take 800-1,600 mg by mouth See admin instructions. 1600 mg with meals, 800 mg with snacks   Yes [provider]  hydrALAZINE  (APRESOLINE ) 100 MG tablet Take 100 mg by mouth 2 (two) times daily. Patient not taking: Reported on 02/04/2024 01/02/24   [provider]    Allergies: Patient has no known allergies.    Review of Systems  Constitutional:  Positive for chills. Negative for fever.  HENT:  Negative for congestion and sore throat.   Eyes: Negative.   Respiratory:  Negative for chest tightness and shortness of breath.   Cardiovascular:  Negative for chest pain.  Gastrointestinal:  Positive for abdominal pain, nausea and vomiting.  Genitourinary: Negative.   Musculoskeletal:  Negative for arthralgias, joint swelling and neck pain.  Skin: Negative.  Negative for rash and wound.  Neurological:  Negative for dizziness, weakness, light-headedness, numbness and headaches.  Psychiatric/Behavioral: Negative.      Updated Vital Signs BP (!) 170/125   Pulse 88   Temp 98.3 F (36.8 C) (Oral)   Resp 11   Ht 6' (1.829 m)   Wt 92.5 kg   SpO2 96%   BMI 27.67 kg/m   Physical Exam Vitals and nursing note reviewed.  Constitutional:  Appearance: He is well-developed.  HENT:     Head: Normocephalic and atraumatic.   Eyes:     Conjunctiva/sclera: Conjunctivae normal.    Cardiovascular:     Rate and Rhythm: Normal rate and regular rhythm.     Pulses: Normal pulses.     Heart sounds: Murmur heard.     Systolic murmur is present with a grade of 4/6.  Pulmonary:     Effort: Pulmonary effort is normal.     Breath sounds: Rhonchi present. No wheezing.     Comments: Rhonchi throughout lung fields Abdominal:     General: Bowel sounds  are normal.     Palpations: Abdomen is soft.     Tenderness: There is no abdominal tenderness.   Musculoskeletal:        General: Normal range of motion.     Cervical back: Normal range of motion.   Skin:    General: Skin is warm and dry.   Neurological:     Mental Status: He is alert.     (all labs ordered are listed, but only abnormal results are displayed) Labs Reviewed  CBC WITH DIFFERENTIAL/PLATELET - Abnormal; Notable for the following components:      Result Value   RBC 3.99 (*)    Hemoglobin 11.7 (*)    HCT 35.0 (*)    All other components within normal limits  COMPREHENSIVE METABOLIC PANEL WITH GFR - Abnormal; Notable for the following components:   BUN 52 (*)    Creatinine, Ser 11.56 (*)    GFR, Estimated 5 (*)    All other components within normal limits  LIPASE, BLOOD - Abnormal; Notable for the following components:   Lipase 57 (*)    All other components within normal limits  TROPONIN I (HIGH SENSITIVITY) - Abnormal; Notable for the following components:   Troponin I (High Sensitivity) 55 (*)    All other components within normal limits  TROPONIN I (HIGH SENSITIVITY) - Abnormal; Notable for the following components:   Troponin I (High Sensitivity) 54 (*)    All other components within normal limits  CULTURE, BLOOD (ROUTINE X 2)  CULTURE, BLOOD (ROUTINE X 2)  RESP PANEL BY RT-PCR (RSV, FLU A&B, COVID)  [id number]  LACTIC ACID, PLASMA  LACTIC ACID, PLASMA  MAGNESIUM   URINALYSIS, ROUTINE W REFLEX MICROSCOPIC  LEGIONELLA PNEUMOPHILA SEROGP 1 UR AG  STREP PNEUMONIAE URINARY ANTIGEN  HIV ANTIBODY (ROUTINE TESTING W REFLEX)    EKG: EKG Interpretation Date/Time:  Tuesday February 04 2024 11:16:09 EDT Ventricular Rate:  92 PR Interval:  213 QRS Duration:  92 QT Interval:  398 QTC Calculation: 493 R Axis:   -53  Text Interpretation: Sinus rhythm Borderline prolonged PR interval Left anterior fascicular block Borderline prolonged QT interval Non-specific ST-t  changes Confirmed by Bernard Drivers ([id number]) on 02/04/2024 11:19:44 AM  Radiology: [facility] Chest Portable 1 View Result Date: 02/04/2024 CLINICAL DATA:  Emesis, hypoxia, concern for aspiration EXAM: PORTABLE CHEST 1 VIEW COMPARISON:  05/28/2023 FINDINGS: Single frontal view of the chest demonstrates right internal jugular dialysis catheter unchanged. Cardiac silhouette remains enlarged. Overall worsening volume status, with increased central pulmonary vascular congestion and diffuse interstitial and ground-glass opacities consistent with pulmonary edema. Streaky retrocardiac consolidation could reflect airspace disease or atelectasis. No effusion or pneumothorax. IMPRESSION: 1. Overall findings consistent with worsening volume status and developing pulmonary edema. 2. Streaky left lower lobe consolidation in the retrocardiac region could reflect aspiration given clinical presentation, pneumonia, or atelectasis. Electronically Signed   By:  Ozell Daring M.D.   On: 02/04/2024 10:30     Procedures   Medications Ordered in the ED  amLODipine  (NORVASC ) tablet 5 mg (has no administration in time range)  cloNIDine (CATAPRES - Dosed in mg/24 hr) patch 0.1 mg (has no administration in time range)  furosemide  (LASIX ) tablet 40 mg (has no administration in time range)  hydrALAZINE  (APRESOLINE ) tablet 100 mg (has no administration in time range)  hydrALAZINE  (APRESOLINE ) tablet 50 mg (has no administration in time range)  losartan (COZAAR) tablet 50 mg (has no administration in time range)  metoprolol  succinate (TOPROL -XL) 24 hr tablet 50 mg (has no administration in time range)  FLUoxetine  (PROZAC ) capsule 10 mg (has no administration in time range)  pantoprazole  (PROTONIX ) EC tablet 40 mg (has no administration in time range)  levETIRAcetam  (KEPPRA ) tablet 500 mg (has no administration in time range)  cefTRIAXone (ROCEPHIN) 2 g in sodium chloride  0.9 % 100 mL IVPB (has no administration in time range)   azithromycin (ZITHROMAX) 500 mg in sodium chloride  0.9 % 250 mL IVPB (has no administration in time range)  heparin  injection 5,000 Units (has no administration in time range)  acetaminophen  (TYLENOL ) tablet 650 mg (has no administration in time range)    Or  acetaminophen  (TYLENOL ) suppository 650 mg (has no administration in time range)  ondansetron  (ZOFRAN ) tablet 4 mg (has no administration in time range)    Or  ondansetron  (ZOFRAN ) injection 4 mg (has no administration in time range)  cefTRIAXone (ROCEPHIN) 1 g in sodium chloride  0.9 % 100 mL IVPB (1 g Intravenous New Bag/Given 02/04/24 1627)  hydrALAZINE  (APRESOLINE ) injection 10 mg (10 mg Intravenous Given 02/04/24 1033)  amLODipine  (NORVASC ) tablet 5 mg (5 mg Oral Given 02/04/24 1032)  cefTRIAXone (ROCEPHIN) 1 g in sodium chloride  0.9 % 100 mL IVPB (0 g Intravenous Stopped 02/04/24 1233)                                    Medical Decision Making Patient presenting with an episode of emesis as he was at dialysis this morning but had not started his treatment.  Concern was for aspiration as he had oxygen saturations to 88% after he vomited.  His chest x-ray today is significant for fluid overload and pulmonary edema, there is a consolidation in his left lower lobe which could reflect an aspiration, therefore he was given a dose of Rocephin here.  Initial plan was to let patient proceed to dialysis, however the center would not be able to accommodate him today but could see him at 11:00 tomorrow.  After removing his oxygen patient stayed around 90 to 92% on room air, but would drop to 85 briefly.  I do not feel comfortable sending him home with these numbers and feel that he needs to be dialyzed today.  Reached out to nephrology in the hopes that we could dialyze him and send him home, our dialysis nurse is full today and cannot accommodate this patient.  Will plan to admit here overnight obs with plans to dialyze in am.   Amount and/or Complexity  of Data Reviewed Labs: ordered.    Details: Labs reviewed including CBC with a hemoglobin of 11.7, his CMET is stable, creatinine of 11.56 which is his normal range, he has a lipase of 57, his delta troponins are flat at 55 then 54. Radiology: ordered.    Details: Pulmonary edema with worsening  volume status.  There is question of a streaky opacity in his left base which could reflect pneumonia.  He was given IV dose of Rocephin. ECG/medicine tests: ordered.    Details: Sinus rhythm with prolonged PR interval, rate 92.  Borderline prolonged QT. Discussion of management or test interpretation with external provider(s): Call placed to Dr. Vernon who accepts pt for overnight observation with plans to dialyze in am.   Risk Decision regarding hospitalization.        Final diagnoses:  Acute pulmonary edema (HCC)  ESRD (end stage renal disease) [facility])    ED Discharge Orders     None          Jonathan Corpus, PA-C 02/04/24 1636    Bernard Drivers, MD 02/05/24 [phone]

## 2024-02-04 NOTE — ED Notes (Signed)
 PA notified family is here wanting an update

## 2024-02-04 NOTE — Progress Notes (Signed)
 [facility]  Dr. Melia requested exchange of dialysis catheter tomorrow as the catheter has gotten pulled out a few centimeters. Likely catheter exchange will be in the afternoon after lunch.  NPO after midnight.  Manuelita Pander MD

## 2024-02-04 NOTE — ED Notes (Signed)
 Patient makes very little urine so unable to collect urine at this time.  Family updated about patient being admitted here and then dialysis here in the am.

## 2024-02-05 ENCOUNTER — Other Ambulatory Visit (HOSPITAL_COMMUNITY): Payer: Self-pay | Admitting: *Deleted

## 2024-02-05 ENCOUNTER — Encounter (HOSPITAL_COMMUNITY): Admission: EM | Disposition: A | Discharge status: Home / Self Care | Payer: Self-pay | Attending: Internal Medicine

## 2024-02-05 ENCOUNTER — Encounter (HOSPITAL_COMMUNITY): Payer: Self-pay | Admitting: Certified Registered Nurse Anesthetist

## 2024-02-05 ENCOUNTER — Observation Stay (HOSPITAL_COMMUNITY)

## 2024-02-05 ENCOUNTER — Inpatient Hospital Stay (HOSPITAL_COMMUNITY)

## 2024-02-05 DIAGNOSIS — F0390 Unspecified dementia without behavioral disturbance: Secondary | ICD-10-CM | POA: Diagnosis present

## 2024-02-05 DIAGNOSIS — I5031 Acute diastolic (congestive) heart failure: Secondary | ICD-10-CM

## 2024-02-05 DIAGNOSIS — Z992 Dependence on renal dialysis: Secondary | ICD-10-CM | POA: Diagnosis not present

## 2024-02-05 DIAGNOSIS — G40909 Epilepsy, unspecified, not intractable, without status epilepticus: Secondary | ICD-10-CM | POA: Diagnosis present

## 2024-02-05 DIAGNOSIS — T829XXA Unspecified complication of cardiac and vascular prosthetic device, implant and graft, initial encounter: Secondary | ICD-10-CM | POA: Diagnosis not present

## 2024-02-05 DIAGNOSIS — G9341 Metabolic encephalopathy: Secondary | ICD-10-CM | POA: Diagnosis present

## 2024-02-05 DIAGNOSIS — N186 End stage renal disease: Secondary | ICD-10-CM | POA: Diagnosis present

## 2024-02-05 DIAGNOSIS — T8242XA Displacement of vascular dialysis catheter, initial encounter: Secondary | ICD-10-CM | POA: Diagnosis present

## 2024-02-05 DIAGNOSIS — Z8673 Personal history of transient ischemic attack (TIA), and cerebral infarction without residual deficits: Secondary | ICD-10-CM | POA: Diagnosis not present

## 2024-02-05 DIAGNOSIS — J810 Acute pulmonary edema: Secondary | ICD-10-CM | POA: Diagnosis present

## 2024-02-05 DIAGNOSIS — N2581 Secondary hyperparathyroidism of renal origin: Secondary | ICD-10-CM | POA: Diagnosis present

## 2024-02-05 DIAGNOSIS — Z79899 Other long term (current) drug therapy: Secondary | ICD-10-CM | POA: Diagnosis not present

## 2024-02-05 DIAGNOSIS — I160 Hypertensive urgency: Secondary | ICD-10-CM

## 2024-02-05 DIAGNOSIS — R569 Unspecified convulsions: Secondary | ICD-10-CM | POA: Diagnosis not present

## 2024-02-05 DIAGNOSIS — I120 Hypertensive chronic kidney disease with stage 5 chronic kidney disease or end stage renal disease: Secondary | ICD-10-CM | POA: Diagnosis present

## 2024-02-05 DIAGNOSIS — K219 Gastro-esophageal reflux disease without esophagitis: Secondary | ICD-10-CM | POA: Diagnosis present

## 2024-02-05 DIAGNOSIS — D631 Anemia in chronic kidney disease: Secondary | ICD-10-CM | POA: Diagnosis present

## 2024-02-05 DIAGNOSIS — J9601 Acute respiratory failure with hypoxia: Secondary | ICD-10-CM | POA: Diagnosis present

## 2024-02-05 DIAGNOSIS — Y848 Other medical procedures as the cause of abnormal reaction of the patient, or of later complication, without mention of misadventure at the time of the procedure: Secondary | ICD-10-CM | POA: Diagnosis present

## 2024-02-05 DIAGNOSIS — J690 Pneumonitis due to inhalation of food and vomit: Secondary | ICD-10-CM | POA: Diagnosis present

## 2024-02-05 DIAGNOSIS — M898X9 Other specified disorders of bone, unspecified site: Secondary | ICD-10-CM | POA: Diagnosis present

## 2024-02-05 LAB — ECHOCARDIOGRAM COMPLETE
Area-P 1/2: 3.08 cm2
Height: 71 in
MV M vel: 5.29 m/s
MV Peak grad: 111.9 mmHg
Radius: 0.5 cm
S' Lateral: 3.1 cm
Weight: 3044.11 [oz_av]

## 2024-02-05 LAB — BASIC METABOLIC PANEL WITH GFR
Anion gap: 12 (ref 5–15)
BUN: 60 mg/dL — ABNORMAL HIGH (ref 8–23)
CO2: 25 mmol/L (ref 22–32)
Calcium: 9 mg/dL (ref 8.9–10.3)
Chloride: 103 mmol/L (ref 98–111)
Creatinine, Ser: 13.37 mg/dL — ABNORMAL HIGH (ref 0.61–1.24)
GFR, Estimated: 4 mL/min — ABNORMAL LOW (ref >60)
Glucose, Bld: 94 mg/dL (ref 70–99)
Potassium: 4.3 mmol/L (ref 3.5–5.1)
Sodium: 140 mmol/L (ref 135–145)

## 2024-02-05 LAB — HIV ANTIBODY (ROUTINE TESTING W REFLEX): HIV Screen 4th Generation wRfx: NONREACTIVE

## 2024-02-05 LAB — CBC
HCT: 33.1 % — ABNORMAL LOW (ref 39.0–52.0)
Hemoglobin: 10.3 g/dL — ABNORMAL LOW (ref 13.0–17.0)
MCH: 27.5 pg (ref 26.0–34.0)
MCHC: 31.1 g/dL (ref 30.0–36.0)
MCV: 88.5 fL (ref 80.0–100.0)
Platelets: 212 10*3/uL (ref 150–400)
RBC: 3.74 MIL/uL — ABNORMAL LOW (ref 4.22–5.81)
RDW: 14.2 % (ref 11.5–15.5)
WBC: 5.6 10*3/uL (ref 4.0–10.5)
nRBC: 0 % (ref 0.0–0.2)

## 2024-02-05 LAB — HEPATITIS B SURFACE ANTIGEN: Hepatitis B Surface Ag: NONREACTIVE

## 2024-02-05 SURGERY — INSERTION OF DIALYSIS CATHETER
Anesthesia: Choice | Laterality: Right

## 2024-02-05 MED ORDER — LIDOCAINE HCL (PF) 1 % IJ SOLN
30.0000 mL | Freq: Once | INTRAMUSCULAR | Status: DC
  Filled 2024-02-05: qty 30

## 2024-02-05 MED ORDER — HEPARIN SODIUM (PORCINE) 1000 UNIT/ML DIALYSIS: 1000.0000 [IU] | INTRAMUSCULAR | Status: DC | PRN

## 2024-02-05 MED ORDER — ALTEPLASE 2 MG IJ SOLR
2.0000 mg | Freq: Once | INTRAMUSCULAR | Status: DC | PRN
Start: 2024-02-05 — End: 2024-02-07

## 2024-02-05 MED ORDER — CALCITRIOL 0.5 MCG PO CAPS
3.0000 ug | ORAL_CAPSULE | ORAL | Status: DC
  Filled 2024-02-05 (×2): qty 6

## 2024-02-05 SURGICAL SUPPLY — 40 items
APPLICATOR CHLORAPREP 10.5 ORG (MISCELLANEOUS) ×1 IMPLANT
BAG DECANTER FOR FLEXI CONT (MISCELLANEOUS) ×1 IMPLANT
BIOPATCH RED 1 DISK 7.0 (GAUZE/BANDAGES/DRESSINGS) ×1 IMPLANT
CATH PALINDROME-P 19CM W/VT (CATHETERS) IMPLANT
CATH PALINDROME-P 23CM W/VT (CATHETERS) IMPLANT
COUNTER NDL MAGNETIC 40 RED (SET/KITS/TRAYS/PACK) ×1 IMPLANT
COUNTER NEEDLE MAGNETIC 40 RED (SET/KITS/TRAYS/PACK) ×1 IMPLANT
COVER LIGHT HANDLE (MISCELLANEOUS) IMPLANT
COVER LIGHT HANDLE STERIS (MISCELLANEOUS) ×2 IMPLANT
COVER PROBE U/S 5X48 (MISCELLANEOUS) ×1 IMPLANT
DECANTER SPIKE VIAL GLASS SM (MISCELLANEOUS) ×2 IMPLANT
DERMABOND ADVANCED .7 DNX12 (GAUZE/BANDAGES/DRESSINGS) ×1 IMPLANT
DRAPE C-ARM FOLDED MOBILE STRL (DRAPES) ×1 IMPLANT
DRAPE CHEST BREAST 15X10 FENES (DRAPES) ×1 IMPLANT
DRSG SORBAVIEW 3.5X5-5/16 MED (GAUZE/BANDAGES/DRESSINGS) ×1 IMPLANT
ELECTRODE REM PT RTRN 9FT ADLT (ELECTROSURGICAL) ×1 IMPLANT
GAUZE 4X4 16PLY ~~LOC~~+RFID DBL (SPONGE) ×1 IMPLANT
GEL ULTRASOUND 20GR AQUASONIC (MISCELLANEOUS) ×1 IMPLANT
GLOVE BIO SURGEON STRL SZ 6.5 (GLOVE) ×1 IMPLANT
GLOVE BIOGEL PI IND STRL 6.5 (GLOVE) ×1 IMPLANT
GLOVE BIOGEL PI IND STRL 7.0 (GLOVE) ×2 IMPLANT
GOWN STRL REUS W/TWL LRG LVL3 (GOWN DISPOSABLE) ×2 IMPLANT
IV CONNECTOR ONE LINK NDLESS (IV SETS) IMPLANT
IV NS 500ML BAXH (IV SOLUTION) ×1 IMPLANT
KIT TURNOVER KIT A (KITS) ×1 IMPLANT
MARKER SKIN DUAL TIP RULER LAB (MISCELLANEOUS) ×1 IMPLANT
NDL HYPO 18GX1.5 BLUNT FILL (NEEDLE) ×1 IMPLANT
NDL HYPO 25X1 1.5 SAFETY (NEEDLE) ×1 IMPLANT
NEEDLE HYPO 18GX1.5 BLUNT FILL (NEEDLE) ×1 IMPLANT
NEEDLE HYPO 25X1 1.5 SAFETY (NEEDLE) ×1 IMPLANT
PACK SRG BSC III STRL LF ECLPS (CUSTOM PROCEDURE TRAY) ×1 IMPLANT
PAD ARMBOARD POSITIONER FOAM (MISCELLANEOUS) ×1 IMPLANT
PENCIL SMOKE EVACUATOR COATED (MISCELLANEOUS) ×1 IMPLANT
POSITIONER HEAD 8X9X4 ADT (SOFTGOODS) ×1 IMPLANT
SET BASIN LINEN APH (SET/KITS/TRAYS/PACK) ×1 IMPLANT
SUT MNCRL AB 4-0 PS2 18 (SUTURE) ×1 IMPLANT
SUT SILK 2 0 FSL 18 (SUTURE) ×1 IMPLANT
SUT VIC AB 3-0 SH 27X BRD (SUTURE) ×1 IMPLANT
SYR 10ML LL (SYRINGE) ×2 IMPLANT
SYR 30ML LL (SYRINGE) ×1 IMPLANT

## 2024-02-05 NOTE — Progress Notes (Signed)
*  PRELIMINARY RESULTS* Echocardiogram 2D Echocardiogram has been performed.  Bryce Miller 02/05/2024, 4:25 PM

## 2024-02-05 NOTE — Procedures (Signed)
  HEMODIALYSIS TREATMENT NOTE:  CXR this morning finds TDC in unchanged position with tips in right atrium..  Cuff is NOT exposed; palpable just beyond exit site:         Telephone consent for HD obtained from pt's sister.  Cath limbs aspirate and flush without resistance.  Qb 350 with stable AP and VP.  3.5 hour heparin -free treatment completed. Goal met: 2.6 liters removed without interruption in UF.  All blood was returned.  Dr. Kallie secured catheter with three sutures.  Site was redressed.  Pt tolerated procedure with minimal complaint.   Valentina Alcoser, RN [facility]

## 2024-02-05 NOTE — Consult Note (Signed)
 [facility] Renal Consultation Note    Indication for Consultation:  Management of ESRD/hemodialysis; anemia, hypertension/volume and secondary hyperparathyroidism  HPI: Bryce Miller is a 63 y.o. male with a PMH significant for paraplegia s/p spinal cord CVA, HTN, seizure disorder, GERD, and ESRD on HD TTS who presented to [facility] ED on 02/04/24 from HD via EMS after he developed N/V and concern for aspiration.  Per EMS, SpO2 was 88% on arrival and started on 4L [place].  In the ED, temp 98, Bp 188/114, HR 80, RR 16, SpO2 95% on 4L.  Labs notble for WBC 6.2, Hgb 11.7, respiratory panel negative.  CXR with worsening pulmonary edema, streaky LLL consolidation which could reflect aspiration.  He pulled on his HD catheter overnight and repeat CXR did not show change in position of RIJ TDC.  He was admitted under observation and we were consulted to provide dialysis during his admission.    Past Medical History:  Diagnosis Date   Constipation    ESRD (end stage renal disease) (HCC)    [place]- [facility]S   GERD (gastroesophageal reflux disease)    Hypertension    Paraplegic spinal paralysis (HCC)    Pre-diabetes    Seizure (HCC)    Stroke (HCC)    left side weakness, memory loss   Past Surgical History:  Procedure Laterality Date   A/V FISTULAGRAM N/A 05/09/2020   Procedure: A/V FISTULAGRAM;  Surgeon: Sheree Penne Bruckner, MD;  Location: [facility] INVASIVE CV LAB;  Service: Cardiovascular;  Laterality: N/A;   A/V SHUNTOGRAM Right 03/21/2018   Procedure: A/V SHUNTOGRAM;  Surgeon: Harvey Carlin BRAVO, MD;  Location: [facility] INVASIVE CV LAB;  Service: Cardiovascular;  Laterality: Right;   AV FISTULA PLACEMENT Right 12/06/2015   Procedure: ARTERIOVENOUS (AV) FISTULA CREATION-RIGHT upper arm ;  Surgeon: Redell LITTIE Door, MD;  Location: [facility] OR;  Service: Vascular;  Laterality: Right;   AV FISTULA PLACEMENT Left 02/28/2021   Procedure: LEFT ARM ARTERIOVENOUS (AV) FISTULA CREATION;  Surgeon: Oris Krystal FALCON, MD;   Location: [facility] ORS;  Service: Vascular;  Laterality: Left;   BASCILIC VEIN TRANSPOSITION Right 08/29/2020   Procedure: RIGHT FIRST STAGE BASCILIC VEIN TRANSPOSITION VERSUS ARTERIOVENOUS GRAFT;  Surgeon: Eliza Bruckner RAMAN, MD;  Location: [facility] OR;  Service: Vascular;  Laterality: Right;   COLONOSCOPY N/A 09/30/2014   Procedure: COLONOSCOPY;  Surgeon: Lamar CHRISTELLA Hollingshead, MD;  Location: [facility] ENDO SUITE;  Service: Endoscopy;  Laterality: N/A;  2:30 AM - moved to 1:10 - Ginger to notify pt   CSF SHUNT     INSERTION OF DIALYSIS CATHETER Right 07/05/2020   Procedure: INSERTION OF TUNNEL  DIALYSIS CATHETER USING 19CM PRECISION CHRONIC CATHETER;  Surgeon: Eliza Bruckner RAMAN, MD;  Location: [facility] OR;  Service: Vascular;  Laterality: Right;   IR DIALY SHUNT INTRO NEEDLE/INTRACATH INITIAL W/IMG LEFT Left 06/01/2020   IR FLUORO GUIDE CV LINE RIGHT  06/01/2020   LIGATION ARTERIOVENOUS GORTEX GRAFT Right 07/05/2020   Procedure: LIGATION EXCISION  ARTERIOVENOUS GORTEX GRAFT;  Surgeon: Eliza Bruckner RAMAN, MD;  Location: [facility] OR;  Service: Vascular;  Laterality: Right;   PERIPHERAL VASCULAR BALLOON ANGIOPLASTY Right 03/21/2018   Procedure: PERIPHERAL VASCULAR BALLOON ANGIOPLASTY;  Surgeon: Harvey Carlin BRAVO, MD;  Location: [facility] INVASIVE CV LAB;  Service: Cardiovascular;  Laterality: Right;  AV GRAFT   PERIPHERAL VASCULAR INTERVENTION Right 05/09/2020   Procedure: PERIPHERAL VASCULAR INTERVENTION;  Surgeon: Sheree Penne Bruckner, MD;  Location: [facility] INVASIVE CV LAB;  Service: Cardiovascular;  Laterality: Right;   Family History:  Family History  Problem Relation Age of Onset   Diabetes Mother    Social History:  reports that he has never smoked. He has never used smokeless tobacco. He reports that he does not currently use drugs after having used the following drugs: Cocaine. He reports that he does not drink alcohol. No Known Allergies Prior to Admission medications   Medication Sig Start Date End Date Taking?  Authorizing Provider  amLODipine  (NORVASC ) 5 MG tablet Take 5 mg by mouth daily. 08/15/20  Yes [provider]  cloNIDine (CATAPRES - DOSED IN MG/24 HR) 0.1 mg/24hr patch Place 0.1 mg onto the skin once a week. 01/21/24  Yes [provider]  FLUoxetine  (PROZAC ) 10 MG capsule Take 10 mg by mouth daily. 08/15/20  Yes [provider]  furosemide  (LASIX ) 40 MG tablet Take 40 mg by mouth daily.    Yes [provider]  hydrALAZINE  (APRESOLINE ) 50 MG tablet Take 50 mg by mouth 2 (two) times daily.   Yes [provider]  levETIRAcetam  (KEPPRA ) 500 MG tablet Take 500 mg by mouth 2 (two) times daily. 08/15/20  Yes [provider]  losartan (COZAAR) 100 MG tablet Take 50 mg by mouth daily. 11/15/23  Yes [provider]  metoprolol  succinate (TOPROL -XL) 50 MG 24 hr tablet Take 50 mg by mouth daily. 12/31/23  Yes [provider]  omeprazole (PRILOSEC) 20 MG capsule Take 20 mg by mouth daily.   Yes [provider]  sevelamer carbonate (RENVELA) 800 MG tablet Take 800-1,600 mg by mouth See admin instructions. 1600 mg with meals, 800 mg with snacks   Yes [provider]  hydrALAZINE  (APRESOLINE ) 100 MG tablet Take 100 mg by mouth 2 (two) times daily. Patient not taking: Reported on 02/04/2024 01/02/24   [provider]   Current Facility-Administered Medications  Medication Dose Route Frequency Provider Last Rate Last Admin   acetaminophen  (TYLENOL ) tablet 650 mg  650 mg Oral Q6H PRN Vernon Ranks, MD       Or   acetaminophen  (TYLENOL ) suppository 650 mg  650 mg Rectal Q6H PRN Vernon Ranks, MD       amLODipine  (NORVASC ) tablet 5 mg  5 mg Oral Daily Pahwani, Ravi, MD       azithromycin (ZITHROMAX) 500 mg in sodium chloride  0.9 % 250 mL IVPB  500 mg Intravenous Q24H Vernon Ranks, MD   Stopped at 02/04/24 1806   cefTRIAXone (ROCEPHIN) 2 g in sodium chloride  0.9 % 100 mL IVPB  2 g Intravenous Q24H Vernon Ranks, MD        Chlorhexidine  Gluconate Cloth 2 % PADS 6 each  6 each Topical Q0600 Melia Lynwood ORN, MD   6 each at 02/05/24 [phone]   cloNIDine (CATAPRES - Dosed in mg/24 hr) patch 0.1 mg  0.1 mg Transdermal Weekly Pahwani, Ravi, MD       FLUoxetine  (PROZAC ) capsule 10 mg  10 mg Oral Daily Pahwani, Ravi, MD   10 mg at 02/04/24 1806   furosemide  (LASIX ) tablet 40 mg  40 mg Oral Daily Pahwani, Ravi, MD   40 mg at 02/04/24 1806   heparin  injection 5,000 Units  5,000 Units Subcutaneous Q8H Pahwani, Ravi, MD   5,000 Units at 02/05/24 9447   hydrALAZINE  (APRESOLINE ) tablet 100 mg  100 mg Oral BID Pahwani, Ravi, MD   100 mg at 02/04/24 2112   hydrALAZINE  (APRESOLINE ) tablet 50 mg  50 mg Oral BID Pahwani, Ravi, MD   50 mg at 02/04/24 2112  labetalol  (NORMODYNE ) injection 10 mg  10 mg Intravenous Q2H PRN Pahwani, Ravi, MD   10 mg at 02/04/24 1939   levETIRAcetam  (KEPPRA ) tablet 500 mg  500 mg Oral BID Pahwani, Ravi, MD   500 mg at 02/04/24 2112   losartan (COZAAR) tablet 50 mg  50 mg Oral Daily Pahwani, Ravi, MD   50 mg at 02/04/24 1806   metoprolol  succinate (TOPROL -XL) 24 hr tablet 50 mg  50 mg Oral Daily Pahwani, Ravi, MD   50 mg at 02/04/24 1806   nicardipine (CARDENE) 20mg  in 0.86% saline IV infusion (0.1 mg/ml)  3-15 mg/hr Intravenous Continuous Adefeso, Oladapo, DO 30 mL/hr at 02/05/24 0635 3 mg/hr at 02/05/24 9364   ondansetron  (ZOFRAN ) tablet 4 mg  4 mg Oral Q6H PRN Vernon Ranks, MD       Or   ondansetron  (ZOFRAN ) injection 4 mg  4 mg Intravenous Q6H PRN Vernon Ranks, MD       pantoprazole  (PROTONIX ) EC tablet 40 mg  40 mg Oral Daily Vernon Ranks, MD   40 mg at 02/04/24 1806   Labs: Basic Metabolic Panel: Recent Labs  Lab 02/04/24 1029 02/05/24 0438  NA 140 140  K 4.2 4.3  CL 102 103  CO2 25 25  GLUCOSE 92 94  BUN 52* 60*  CREATININE 11.56* 13.37*  CALCIUM  9.3 9.0   Liver Function Tests: Recent Labs  Lab 02/04/24 1029  AST 17  ALT 26  ALKPHOS 54  BILITOT 0.9  PROT 7.4  ALBUMIN 3.5    Recent Labs  Lab 02/04/24 1029  LIPASE 57*   No results for input(s): AMMONIA in the last 168 hours. CBC: Recent Labs  Lab 02/04/24 1029 02/05/24 0438  WBC 6.2 5.6  NEUTROABS 4.7  --   HGB 11.7* 10.3*  HCT 35.0* 33.1*  MCV 87.7 88.5  PLT 218 212   Cardiac Enzymes: No results for input(s): CKTOTAL, CKMB, CKMBINDEX, TROPONINI in the last 168 hours. CBG: No results for input(s): GLUCAP in the last 168 hours. Iron Studies: No results for input(s): IRON, TIBC, TRANSFERRIN, FERRITIN in the last 72 hours. Studies/Results: DG Chest Port 1 View Result Date: 02/05/2024 CLINICAL DATA:  Check dialysis catheter placement.  Hypertension. EXAM: PORTABLE CHEST 1 VIEW COMPARISON:  Yesterday FINDINGS: Right-sided dialysis catheter tip is similar at mid to high right atrium. Midline trachea. Mild cardiomegaly. Atherosclerosis in the transverse aorta. No pleural effusion or pneumothorax. Moderate interstitial edema, increased. Persistent bibasilar airspace disease medially. IMPRESSION: No change in position of right-sided dialysis catheter. Minimal increase in moderate congestive heart failure. Persistent bibasilar airspace disease, most likely atelectasis. Electronically Signed   By: Rockey Kilts M.D.   On: 02/05/2024 08:30   DG Chest Portable 1 View Result Date: 02/04/2024 CLINICAL DATA:  Emesis, hypoxia, concern for aspiration EXAM: PORTABLE CHEST 1 VIEW COMPARISON:  05/28/2023 FINDINGS: Single frontal view of the chest demonstrates right internal jugular dialysis catheter unchanged. Cardiac silhouette remains enlarged. Overall worsening volume status, with increased central pulmonary vascular congestion and diffuse interstitial and ground-glass opacities consistent with pulmonary edema. Streaky retrocardiac consolidation could reflect airspace disease or atelectasis. No effusion or pneumothorax. IMPRESSION: 1. Overall findings consistent with worsening volume status and developing  pulmonary edema. 2. Streaky left lower lobe consolidation in the retrocardiac region could reflect aspiration given clinical presentation, pneumonia, or atelectasis. Electronically Signed   By: Ozell Daring M.D.   On: 02/04/2024 10:30    ROS: Pertinent items are noted in HPI. Physical Exam: Vitals:  02/05/24 0400 02/05/24 0500 02/05/24 0600 02/05/24 0700  BP: (!) 168/90 (!) 168/89 (!) 168/88   Pulse: 82 78 77   Resp: (!) 24 18 16    Temp:    97.6 F (36.4 C)  TempSrc:      SpO2: 97% 95% 94%   Weight:      Height:          Weight change:   Intake/Output Summary (Last 24 hours) at 02/05/2024 9077 Last data filed at 02/05/2024 9364 Gross per 24 hour  Intake 712.64 ml  Output --  Net 712.64 ml   BP (!) 168/88   Pulse 77   Temp 97.6 F (36.4 C)   Resp 16   Ht 5' 11 (1.803 m)   Wt 88.5 kg   SpO2 94%   BMI 27.21 kg/m  General appearance: alert, cooperative, and no distress Head: Normocephalic, without obvious abnormality, atraumatic Resp: rhonchi bilaterally Cardio: regular rate and rhythm, S1, S2 normal, no murmur, click, rub or gallop GI: soft, non-tender; bowel sounds normal; no masses,  no organomegaly Extremities: extremities normal, atraumatic, no cyanosis or edema Dialysis Access:  Dialysis Orders: Center:  [facility]  on TTS . EDW 90kg HD [place] 2K/2.5Ca  Time 4:00 Heparin  1000 units bolus then 1200 units/hr. Access RIJ TDC BFR 400 DFR 500    Zemplar 3 mcg po with HD three times per week Venofer  50 mg IV once a week Micera 30 mg IV q2 weeks  Sensipar 30 mg tiw  Assessment/Plan:  Acute hypoxic respiratory failure secondary to aspiration pneumonia - currently on Rocephin and azithromycin per primary svc  ESRD -   off schedule but plan for HD today and will eventually get back on schedule.  Hypertension/volume  - UF with HD and follow  Anemia  - Hgb stable  Metabolic bone disease -   continue with home meds  Nutrition -  renal diet  Vascular access -  pulled on his HD catheter and to have exchange by Dr. Kallie later today, however CXR without much change in position.  IF runs well, may not need to exchange.  Disposition - hopeful discharge soon, when oxygen can be weaned.  Fairy RONAL Sellar, MD [facility], [facility] 02/05/2024, 9:22 AM

## 2024-02-05 NOTE — TOC CM/SW Note (Addendum)
 Transition of Care [facility]) - Inpatient Brief Assessment   Patient Details  Name: Bryce Miller MRN: [id number] Date of Birth: [birth year]  Transition of Care [facility]) CM/SW Contact:    Noreen KATHEE Pinal, LCSWA Phone Number: 02/05/2024, 11:23 AM   Clinical Narrative:   Transition of Care Department [facility]) has reviewed patient and no TOC needs have been identified at this time. We will continue to monitor patient advancement through interdisciplinary progression rounds. If new patient transition needs arise, please place a TOC consult.   Transition of Care Asessment: Insurance and Status: Insurance coverage has been reviewed Patient has primary care physician: Yes Home environment has been reviewed: Single Family Home with sister and nephew Prior level of function:: Independent Prior/Current Home Services: No current home services Social Drivers of Health Review: SDOH reviewed no interventions necessary Readmission risk has been reviewed: Yes Transition of care needs: no transition of care needs at this time

## 2024-02-05 NOTE — Consult Note (Signed)
 [facility] Consult  Reason for Consult: Unsecured dialysis catheter, partially dislodged  Referring Physician: Dr. Melia  Chief Complaint   Emesis     HPI: Bryce Miller is a 63 y.o. 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  He came to the ED after concern for aspiration PNA. He did pull at his dialysis catheter and there was some concern that it had been dislodged. After CXR the catheter was in good position. The dialysis RN confirmed no cuff was exposed.  He has been getting dialysis through the catheter this AM without issues.  The dialysis RN has spoke to his sister and updated her on the dialysis and the catheter and need to resecure it.   Past Medical History:  Diagnosis Date   Constipation    ESRD (end stage renal disease) (HCC)    [place]- TTHS   GERD (gastroesophageal reflux disease)    Hypertension    Paraplegic spinal paralysis (HCC)    Pre-diabetes    Seizure (HCC)    Stroke (HCC)    left side weakness, memory loss    Past Surgical History:  Procedure Laterality Date   A/V FISTULAGRAM N/A 05/09/2020   Procedure: A/V FISTULAGRAM;  Surgeon: Sheree Penne Bruckner, MD;  Location: [facility] INVASIVE CV LAB;  Service: Cardiovascular;  Laterality: N/A;   A/V SHUNTOGRAM Right 03/21/2018   Procedure: A/V SHUNTOGRAM;  Surgeon: Harvey Carlin BRAVO, MD;  Location: [facility] INVASIVE CV LAB;  Service: Cardiovascular;  Laterality: Right;   AV FISTULA PLACEMENT Right 12/06/2015   Procedure: ARTERIOVENOUS (AV) FISTULA CREATION-RIGHT upper arm ;  Surgeon: Redell LITTIE Door, MD;  Location: [facility] OR;  Service: Vascular;  Laterality: Right;   AV FISTULA PLACEMENT Left 02/28/2021   Procedure: LEFT ARM ARTERIOVENOUS (AV) FISTULA CREATION;  Surgeon: Oris Krystal FALCON, MD;  Location: [facility] ORS;  Service: Vascular;  Laterality: Left;   BASCILIC VEIN TRANSPOSITION Right 08/29/2020   Procedure: RIGHT FIRST STAGE BASCILIC VEIN TRANSPOSITION VERSUS ARTERIOVENOUS GRAFT;  Surgeon:  Eliza Bruckner RAMAN, MD;  Location: [facility] OR;  Service: Vascular;  Laterality: Right;   COLONOSCOPY N/A 09/30/2014   Procedure: COLONOSCOPY;  Surgeon: Lamar CHRISTELLA Hollingshead, MD;  Location: [facility] ENDO SUITE;  Service: Endoscopy;  Laterality: N/A;  2:30 AM - moved to 1:10 - Ginger to notify pt   CSF SHUNT     INSERTION OF DIALYSIS CATHETER Right 07/05/2020   Procedure: INSERTION OF TUNNEL  DIALYSIS CATHETER USING 19CM PRECISION CHRONIC CATHETER;  Surgeon: Eliza Bruckner RAMAN, MD;  Location: [facility] OR;  Service: Vascular;  Laterality: Right;   IR DIALY SHUNT INTRO NEEDLE/INTRACATH INITIAL W/IMG LEFT Left 06/01/2020   IR FLUORO GUIDE CV LINE RIGHT  06/01/2020   LIGATION ARTERIOVENOUS GORTEX GRAFT Right 07/05/2020   Procedure: LIGATION EXCISION  ARTERIOVENOUS GORTEX GRAFT;  Surgeon: Eliza Bruckner RAMAN, MD;  Location: [facility] OR;  Service: Vascular;  Laterality: Right;   PERIPHERAL VASCULAR BALLOON ANGIOPLASTY Right 03/21/2018   Procedure: PERIPHERAL VASCULAR BALLOON ANGIOPLASTY;  Surgeon: Harvey Carlin BRAVO, MD;  Location: [facility] INVASIVE CV LAB;  Service: Cardiovascular;  Laterality: Right;  AV GRAFT   PERIPHERAL VASCULAR INTERVENTION Right 05/09/2020   Procedure: PERIPHERAL VASCULAR INTERVENTION;  Surgeon: Sheree Penne Bruckner, MD;  Location: [facility] INVASIVE CV LAB;  Service: Cardiovascular;  Laterality: Right;    Family History  Problem Relation Age of Onset   Diabetes Mother     Social History   Tobacco Use   Smoking status: Never   Smokeless tobacco: Never  Vaping  Use   Vaping status: Never Used  Substance Use Topics   Alcohol use: No   Drug use: Not Currently    Types: Cocaine    Comment: none since 2009    Medications: I have reviewed the patient's current medications. Prior to Admission:  Facility-Administered Medications Prior to Admission  Medication Dose Route Frequency Provider Last Rate Last Admin   0.9 %  sodium chloride  infusion  250 mL Intravenous PRN Sheree Penne Bruckner, MD        Medications Prior to Admission  Medication Sig Dispense Refill Last Dose/Taking   amLODipine  (NORVASC ) 5 MG tablet Take 5 mg by mouth daily.   02/03/2024 Morning   cloNIDine (CATAPRES - DOSED IN MG/24 HR) 0.1 mg/24hr patch Place 0.1 mg onto the skin once a week.   01/29/2024   FLUoxetine  (PROZAC ) 10 MG capsule Take 10 mg by mouth daily.   02/03/2024 Morning   furosemide  (LASIX ) 40 MG tablet Take 40 mg by mouth daily.    02/03/2024   hydrALAZINE  (APRESOLINE ) 50 MG tablet Take 50 mg by mouth 2 (two) times daily.   02/03/2024 Evening   levETIRAcetam  (KEPPRA ) 500 MG tablet Take 500 mg by mouth 2 (two) times daily.   02/03/2024 Evening   losartan (COZAAR) 100 MG tablet Take 50 mg by mouth daily.   02/03/2024 Morning   metoprolol  succinate (TOPROL -XL) 50 MG 24 hr tablet Take 50 mg by mouth daily.   02/03/2024 Morning   omeprazole (PRILOSEC) 20 MG capsule Take 20 mg by mouth daily.   02/03/2024 Morning   sevelamer carbonate (RENVELA) 800 MG tablet Take 800-1,600 mg by mouth See admin instructions. 1600 mg with meals, 800 mg with snacks   02/03/2024 Evening   hydrALAZINE  (APRESOLINE ) 100 MG tablet Take 100 mg by mouth 2 (two) times daily. (Patient not taking: Reported on 02/04/2024)   Not Taking   Scheduled:  amLODipine   5 mg Oral Daily   calcitRIOL  3 mcg Oral Q M,W,F-HD   Chlorhexidine  Gluconate Cloth  6 each Topical Q0600   cloNIDine  0.1 mg Transdermal Weekly   FLUoxetine   10 mg Oral Daily   furosemide   40 mg Oral Daily   heparin   5,000 Units Subcutaneous Q8H   hydrALAZINE   100 mg Oral BID   hydrALAZINE   50 mg Oral BID   levETIRAcetam   500 mg Oral BID   lidocaine  (PF)  30 mL Intradermal Once   losartan  50 mg Oral Daily   metoprolol  succinate  50 mg Oral Daily   pantoprazole   40 mg Oral Daily   Continuous:  azithromycin Stopped (02/04/24 1806)   cefTRIAXone (ROCEPHIN)  IV     niCARDipine 3 mg/hr (02/05/24 1033)   PRN:acetaminophen  **OR** acetaminophen , alteplase, heparin , labetalol ,  ondansetron  **OR** ondansetron  (ZOFRAN ) IV  No Known Allergies   ROS:  Review of systems not obtained due to patient factors. Confused   Blood pressure (!) 148/88, pulse 78, temperature 97.7 F (36.5 C), temperature source Oral, resp. rate 17, height 5' 11 (1.803 m), weight 88.9 kg, SpO2 97%. Physical Exam Vitals reviewed.  HENT:     Head: Normocephalic.     Nose: Nose normal.  Cardiovascular:     Rate and Rhythm: Normal rate.  Pulmonary:     Effort: Pulmonary effort is normal.  Chest:     Comments: Dialysis catheter in right chest, and no cuff noted  Abdominal:     General: There is no distension.     Palpations: Abdomen is  soft.  Musculoskeletal:     Comments: Does not move the RUE, moves LUE with mittens in place   Skin:    General: Skin is warm.  Neurological:     Mental Status: He is alert. Mental status is at baseline.     Results: Results for orders placed or performed during the hospital encounter of 02/04/24 (from the past 48 hours)  Lactic acid, plasma     Status: None   Collection Time: 02/04/24 10:29 AM  Result Value Ref Range   Lactic Acid, Venous 1.0 0.5 - 1.9 mmol/L    Comment: Performed at [facility] [address] [place], [place] [address]  CBC with Differential     Status: Abnormal   Collection Time: 02/04/24 10:29 AM  Result Value Ref Range   WBC 6.2 4.0 - 10.5 K/uL   RBC 3.99 (L) 4.22 - 5.81 MIL/uL   Hemoglobin 11.7 (L) 13.0 - 17.0 g/dL   HCT 64.9 (L) 60.9 - 47.9 %   MCV 87.7 80.0 - 100.0 fL   MCH 29.3 26.0 - 34.0 pg   MCHC 33.4 30.0 - 36.0 g/dL   RDW 85.7 88.4 - 84.4 %   Platelets 218 150 - 400 K/uL   nRBC 0.0 0.0 - 0.2 %   Neutrophils Relative % 75 %   Neutro Abs 4.7 1.7 - 7.7 K/uL   Lymphocytes Relative 17 %   Lymphs Abs 1.0 0.7 - 4.0 K/uL   Monocytes Relative 6 %   Monocytes Absolute 0.4 0.1 - 1.0 K/uL   Eosinophils Relative 1 %   Eosinophils Absolute 0.1 0.0 - 0.5 K/uL   Basophils Relative 1 %   Basophils Absolute 0.0 0.0 -  0.1 K/uL   Immature Granulocytes 0 %   Abs Immature Granulocytes 0.02 0.00 - 0.07 K/uL    Comment: Performed at [facility] [address] [place], [place] [address]  Comprehensive metabolic panel     Status: Abnormal   Collection Time: 02/04/24 10:29 AM  Result Value Ref Range   Sodium 140 135 - 145 mmol/L   Potassium 4.2 3.5 - 5.1 mmol/L   Chloride 102 98 - 111 mmol/L   CO2 25 22 - 32 mmol/L   Glucose, Bld 92 70 - 99 mg/dL    Comment: Glucose reference range applies only to samples taken after fasting for at least 8 hours.   BUN 52 (H) 8 - 23 mg/dL   Creatinine, Ser 88.43 (H) 0.61 - 1.24 mg/dL   Calcium  9.3 8.9 - 10.3 mg/dL   Total Protein 7.4 6.5 - 8.1 g/dL   Albumin 3.5 3.5 - 5.0 g/dL   AST 17 15 - 41 U/L   ALT 26 0 - 44 U/L   Alkaline Phosphatase 54 38 - 126 U/L   Total Bilirubin 0.9 0.0 - 1.2 mg/dL   GFR, Estimated 5 (L) >60 mL/min    Comment: (NOTE) Calculated using the CKD-EPI Creatinine Equation (2021)    Anion gap 13 5 - 15    Comment: Performed at [facility], [address], [place], [place] [address]  Lipase, blood     Status: Abnormal   Collection Time: 02/04/24 10:29 AM  Result Value Ref Range   Lipase 57 (H) 11 - 51 U/L    Comment: Performed at [facility], [address], [place], [place] [address]  Blood culture (routine x 2)     Status: None (Preliminary result)   Collection Time: 02/04/24 10:29 AM   Specimen:  BLOOD  Result Value Ref Range   Specimen Description BLOOD BLOOD LEFT ARM AEROBIC BOTTLE ONLY    Special Requests      BOTTLES DRAWN AEROBIC ONLY Blood Culture results may not be optimal due to an inadequate volume of blood received in culture bottles   Culture      NO GROWTH < 24 HOURS Performed at [facility], [address] [place], [place] [address]    Report Status PENDING   Blood culture (routine x 2)     Status: None (Preliminary result)   Collection Time: 02/04/24 10:29 AM   Specimen: BLOOD  Result Value Ref Range   Specimen  Description BLOOD BLOOD LEFT ARM LAC    Special Requests      BOTTLES DRAWN AEROBIC AND ANAEROBIC Blood Culture adequate volume   Culture      NO GROWTH < 24 HOURS Performed at [facility], [address], [place], [place] [address]    Report Status PENDING   Troponin I (High Sensitivity)     Status: Abnormal   Collection Time: 02/04/24 10:29 AM  Result Value Ref Range   Troponin I (High Sensitivity) 55 (H) <18 ng/L    Comment: (NOTE) Elevated high sensitivity troponin I (hsTnI) values and significant  changes across serial measurements may suggest ACS but many other  chronic and acute conditions are known to elevate hsTnI results.  Refer to the Links section for chest pain algorithms and additional  guidance. Performed at [facility], [address] [place], [place] [address]   Lactic acid, plasma     Status: None   Collection Time: 02/04/24 10:56 AM  Result Value Ref Range   Lactic Acid, Venous 0.9 0.5 - 1.9 mmol/L    Comment: Performed at [facility], [address] [place], [place] [address]  Magnesium      Status: None   Collection Time: 02/04/24 12:48 PM  Result Value Ref Range   Magnesium  2.1 1.7 - 2.4 mg/dL    Comment: Performed at [facility], [address], [place], [place] [address]  Troponin I (High Sensitivity)     Status: Abnormal   Collection Time: 02/04/24 12:49 PM  Result Value Ref Range   Troponin I (High Sensitivity) 54 (H) <18 ng/L    Comment: (NOTE) Elevated high sensitivity troponin I (hsTnI) values and significant  changes across serial measurements may suggest ACS but many other  chronic and acute conditions are known to elevate hsTnI results.  Refer to the Links section for chest pain algorithms and additional  guidance. Performed at [facility], [address], [place], [place] [address]   Resp panel by RT-PCR (RSV, Flu A&B, Covid) Anterior Nasal Swab     Status: None   Collection Time: 02/04/24  4:18 PM   Specimen: Anterior Nasal Swab   Result Value Ref Range   SARS Coronavirus 2 by RT PCR NEGATIVE NEGATIVE    Comment: (NOTE) SARS-CoV-2 target nucleic acids are NOT DETECTED.  The SARS-CoV-2 RNA is generally detectable in upper respiratory specimens during the acute phase of infection. The lowest concentration of SARS-CoV-2 viral copies this assay can detect is 138 copies/mL. A negative result does not preclude SARS-Cov-2 infection and should not be used as the sole basis for treatment or other patient management decisions. A negative result may occur with  improper specimen collection/handling, submission of specimen other than nasopharyngeal swab, presence of viral mutation(s) within the areas targeted by this assay, and inadequate number of viral copies(<138 copies/mL).  A negative result must be combined with clinical observations, patient history, and epidemiological information. The expected result is Negative.  Fact Sheet for Patients:  BloggerCourse.com  Fact Sheet for Healthcare Providers:  SeriousBroker.it  This test is no t yet approved or cleared by the [place]  FDA and  has been authorized for detection and/or diagnosis of SARS-CoV-2 by FDA under an Emergency Use Authorization (EUA). This EUA will remain  in effect (meaning this test can be used) for the duration of the COVID-19 declaration under Section 564(b)(1) of the Act, 21 U.S.C.section 360bbb-3(b)(1), unless the authorization is terminated  or revoked sooner.       Influenza A by PCR NEGATIVE NEGATIVE   Influenza B by PCR NEGATIVE NEGATIVE    Comment: (NOTE) The Xpert Xpress SARS-CoV-2/FLU/RSV plus assay is intended as an aid in the diagnosis of influenza from Nasopharyngeal swab specimens and should not be used as a sole basis for treatment. Nasal washings and aspirates are unacceptable for Xpert Xpress SARS-CoV-2/FLU/RSV testing.  Fact Sheet for  Patients: BloggerCourse.com  Fact Sheet for Healthcare Providers: SeriousBroker.it  This test is not yet approved or cleared by the [place]  FDA and has been authorized for detection and/or diagnosis of SARS-CoV-2 by FDA under an Emergency Use Authorization (EUA). This EUA will remain in effect (meaning this test can be used) for the duration of the COVID-19 declaration under Section 564(b)(1) of the Act, 21 U.S.C. section 360bbb-3(b)(1), unless the authorization is terminated or revoked.     Resp Syncytial Virus by PCR NEGATIVE NEGATIVE    Comment: (NOTE) Fact Sheet for Patients: BloggerCourse.com  Fact Sheet for Healthcare Providers: SeriousBroker.it  This test is not yet approved or cleared by the [place]  FDA and has been authorized for detection and/or diagnosis of SARS-CoV-2 by FDA under an Emergency Use Authorization (EUA). This EUA will remain in effect (meaning this test can be used) for the duration of the COVID-19 declaration under Section 564(b)(1) of the Act, 21 U.S.C. section 360bbb-3(b)(1), unless the authorization is terminated or revoked.  Performed at [facility], [address] [place], [place] [address]   Urinalysis, Routine w reflex microscopic -Urine, Clean Catch     Status: Abnormal   Collection Time: 02/04/24  6:08 PM  Result Value Ref Range   Color, Urine STRAW (A) YELLOW   APPearance CLEAR CLEAR   Specific Gravity, Urine 1.008 1.005 - 1.030   pH 8.0 5.0 - 8.0   Glucose, UA 50 (A) NEGATIVE mg/dL   Hgb urine dipstick NEGATIVE NEGATIVE   Bilirubin Urine NEGATIVE NEGATIVE   Ketones, ur NEGATIVE NEGATIVE mg/dL   Protein, ur 899 (A) NEGATIVE mg/dL   Nitrite NEGATIVE NEGATIVE   Leukocytes,Ua NEGATIVE NEGATIVE   RBC / HPF 0-5 0 - 5 RBC/hpf   WBC, UA 0-5 0 - 5 WBC/hpf   Bacteria, UA RARE (A) NONE SEEN   Squamous Epithelial / HPF 0-5 0 - 5 /HPF     Comment: Performed at [facility], [address], [place], [place] [address]  Strep pneumoniae urinary antigen     Status: None   Collection Time: 02/04/24  6:08 PM  Result Value Ref Range   Strep Pneumo Urinary Antigen NEGATIVE NEGATIVE    Comment:        Infection due to S. pneumoniae cannot be absolutely ruled out since the antigen present may be below the detection limit of the test. Performed at [facility], [address] [place], [place] [address]  MRSA Next Gen by PCR, Nasal     Status: None   Collection Time: 02/04/24  6:37 PM   Specimen: Nasal Mucosa; Nasal Swab  Result Value Ref Range   MRSA by PCR Next Gen NOT DETECTED NOT DETECTED    Comment: (NOTE) The GeneXpert MRSA Assay (FDA approved for NASAL specimens only), is one component of a comprehensive MRSA colonization surveillance program. It is not intended to diagnose MRSA infection nor to guide or monitor treatment for MRSA infections. Test performance is not FDA approved in patients less than 67 years old. Performed at [facility], [address], [place], [place] [address]   Basic metabolic panel     Status: Abnormal   Collection Time: 02/05/24  4:38 AM  Result Value Ref Range   Sodium 140 135 - 145 mmol/L   Potassium 4.3 3.5 - 5.1 mmol/L   Chloride 103 98 - 111 mmol/L   CO2 25 22 - 32 mmol/L   Glucose, Bld 94 70 - 99 mg/dL    Comment: Glucose reference range applies only to samples taken after fasting for at least 8 hours.   BUN 60 (H) 8 - 23 mg/dL   Creatinine, Ser 86.62 (H) 0.61 - 1.24 mg/dL   Calcium  9.0 8.9 - 10.3 mg/dL   GFR, Estimated 4 (L) >60 mL/min    Comment: (NOTE) Calculated using the CKD-EPI Creatinine Equation (2021)    Anion gap 12 5 - 15    Comment: Performed at [facility], [address] [place], [place] [address]  CBC     Status: Abnormal   Collection Time: 02/05/24  4:38 AM  Result Value Ref Range   WBC 5.6 4.0 - 10.5 K/uL   RBC 3.74 (L) 4.22 - 5.81 MIL/uL    Hemoglobin 10.3 (L) 13.0 - 17.0 g/dL   HCT 66.8 (L) 60.9 - 47.9 %   MCV 88.5 80.0 - 100.0 fL   MCH 27.5 26.0 - 34.0 pg   MCHC 31.1 30.0 - 36.0 g/dL   RDW 85.7 88.4 - 84.4 %   Platelets 212 150 - 400 K/uL   nRBC 0.0 0.0 - 0.2 %    Comment: Performed at [facility], [address] [place], [place] [address]    DG Chest Port 1 View Result Date: 02/05/2024 CLINICAL DATA:  Check dialysis catheter placement.  Hypertension. EXAM: PORTABLE CHEST 1 VIEW COMPARISON:  Yesterday FINDINGS: Right-sided dialysis catheter tip is similar at mid to high right atrium. Midline trachea. Mild cardiomegaly. Atherosclerosis in the transverse aorta. No pleural effusion or pneumothorax. Moderate interstitial edema, increased. Persistent bibasilar airspace disease medially. IMPRESSION: No change in position of right-sided dialysis catheter. Minimal increase in moderate congestive heart failure. Persistent bibasilar airspace disease, most likely atelectasis. Electronically Signed   By: Rockey Kilts M.D.   On: 02/05/2024 08:30   DG Chest Portable 1 View Result Date: 02/04/2024 CLINICAL DATA:  Emesis, hypoxia, concern for aspiration EXAM: PORTABLE CHEST 1 VIEW COMPARISON:  05/28/2023 FINDINGS: Single frontal view of the chest demonstrates right internal jugular dialysis catheter unchanged. Cardiac silhouette remains enlarged. Overall worsening volume status, with increased central pulmonary vascular congestion and diffuse interstitial and ground-glass opacities consistent with pulmonary edema. Streaky retrocardiac consolidation could reflect airspace disease or atelectasis. No effusion or pneumothorax. IMPRESSION: 1. Overall findings consistent with worsening volume status and developing pulmonary edema. 2. Streaky left lower lobe consolidation in the retrocardiac region could reflect aspiration given clinical presentation, pneumonia, or atelectasis. Electronically Signed   By: Ozell  Delores M.D.   On: 02/04/2024 10:30    Procedure: Sutured in dialysis catheter  Diagnosis: Partially dislodged but viable dialysis catheter  Description: The catheter and entry site were prepped. Lidocaine  was injected. 3-0 Nylon suture was used to secure the catheter in a pursestring around the entry site. The cuff was noted to be in the tunnel at least 1 cm from the opening.  Additional 3-0 Nylon suture was used to secure at the wing of the dialysis catheter. A new sterile dressing was placed.    Assessment & Plan:  Bryce Miller is a 63 y.o. male with dialysis catheter that was possibly partially dislodged. The CXR is in good position and the cuff is not exposed. The catheter was re-secured without issues.   Updated the team.    Manuelita JAYSON Pander 02/05/2024, 1:40 PM

## 2024-02-05 NOTE — Progress Notes (Signed)
  Progress Note   Patient: Bryce Miller FMW:[id number] DOB: [birth year] DOA: 02/04/2024     0 DOS: the patient was seen and examined on 02/05/2024   Brief hospital admission narrative:  Bryce Miller is a 63 y.o. male with medical history significant of ESRD, dialysis dependent on TTS schedule, prior CVA with left-sided spinal paralysis, history of hypertension, seizure disorder, hypertension, GERD was sent to the ED from dialysis center after he was noted to have vomited and concern for possible aspiration.  Patient apparently confused, having difficulty to complete sentences, following commands and demonstrating inability to safely take p.o.'s.  Assessment and plan 1-nausea/vomiting aspiration - Currently afebrile - Reporting no nausea or vomiting - Patient has passed bedside swallowing evaluation - Dysphagia 3 diet with thin liquids has been ordered - Continue current antibiotics - Constant reorientation and supportive care to be continued - Follow clinical response.  2-ESRD - Status post hemodialysis - Appreciate nephrology assistance and recommendation - Planning dialysis again 02/06/2024 to get patient back on schedule (Tuesday-Thursday and Saturday) - Continue Renvela  3-GERD - Continue PPI  4-acute interstitial pulmonary edema - Vascular congestion and interstitial edema appreciated on chest x-ray at time of admission - Continue home Lasix  and volume management with dialysis.  5-hypertensive urgency - Patient with blood pressure of 187/116 on arrival - Much improved and stabilized after transient using nicardipine drip and providing hemodialysis -Continue home antihypertensive agents and follow vital signs response - If blood pressure remains stable patient will be able to be transferred out of the unit.  6-prior history of stroke - No acute focal neurologic deficit appreciated - Continue risk factor modifications - Physical therapy evaluation requested  7-acute metabolic  encephalopathy versus underlying dementia - Continue supportive care and constant reorientation - Outpatient workup for dementia recommended - Continue treatment for acute infection and follow clinical response.  8-history of seizure - No seizure activity appreciated - Continue Keppra   9-anemia of chronic disease - Hemoglobin appears stable - No overt bleeding appreciated - IV iron and Epogen  therapy as per nephrology discretion.   Subjective:   Physical Exam: Vitals:   02/05/24 1600 02/05/24 1700 02/05/24 1800 02/05/24 1923  BP: (!) 179/93  (!) 158/92 (!) 169/92  Pulse: 75 85 76 79  Resp: 17 20 (!) 22 (!) 24  Temp:      TempSrc:      SpO2: 98% 95% 97% 97%  Weight:      Height:       General exam: Alert, awake, oriented x 3 on today's examination; patient taking time to answer questions but appropriate otherwise. Respiratory system: 2 L nasal cannula in place appears to be for comfort as patient demonstrating good saturation.  No wheezing or crackles on exam. Cardiovascular system:RRR. No murmurs, rubs, gallops. Gastrointestinal system: Abdomen is nondistended, soft and nontender. No organomegaly or masses felt. Normal bowel sounds heard. Central nervous system: No new focal neurological deficits. Extremities: No cyanosis or clubbing. Skin: No petechiae. Psychiatry: Flat affect appreciated on exam.   Data Reviewed: Basic metabolic panel: Sodium 140, potassium 4.3, chloride 103, bicarb 25, BUN 60, creatinine 13.37 and GFR 4 CBC: WBCs 5.6, hemoglobin 10.3 and platelet count 212K  Family Communication: No family at bedside.  Disposition: Status is: Inpatient Remains inpatient appropriate because: Continue IV therapy.  Anticipating discharge back home once medically stable. Time spent: 50 minutes  Author: Eric Nunnery, MD 02/05/2024 7:35 PM  For on call review [email or web address].

## 2024-02-05 NOTE — Progress Notes (Signed)
 SLP Cancellation Note  Patient Details Name: Michai Dieppa MRN: [id number] DOB: 01/03/1961   Cancelled treatment:        SLP attempted to see patient for bedside swallow evaluation. Per NSG, pt with possible dialysis treatment today and NPO awaiting surgical procedure as well this afternoon. Pt not appropriate for PO at this time. SLP to follow up as schedule allows.    Waddell JONETTA Novak, MA CCC-SLP 02/05/2024, 10:06 AM

## 2024-02-05 NOTE — Plan of Care (Signed)
  Problem: Education: Goal: Knowledge of General Education information will improve Description: Including pain rating scale, medication(s)/side effects and non-pharmacologic comfort measures Outcome: Progressing   Problem: Coping: Goal: Level of anxiety will decrease Outcome: Progressing   Problem: Pain Managment: Goal: General experience of comfort will improve and/or be controlled Outcome: Progressing   Problem: Skin Integrity: Goal: Risk for impaired skin integrity will decrease Outcome: Progressing

## 2024-02-06 DIAGNOSIS — I10 Essential (primary) hypertension: Secondary | ICD-10-CM

## 2024-02-06 DIAGNOSIS — I160 Hypertensive urgency: Secondary | ICD-10-CM

## 2024-02-06 DIAGNOSIS — I6389 Other cerebral infarction: Secondary | ICD-10-CM

## 2024-02-06 DIAGNOSIS — J810 Acute pulmonary edema: Principal | ICD-10-CM

## 2024-02-06 DIAGNOSIS — R569 Unspecified convulsions: Secondary | ICD-10-CM

## 2024-02-06 DIAGNOSIS — N186 End stage renal disease: Secondary | ICD-10-CM | POA: Diagnosis not present

## 2024-02-06 DIAGNOSIS — I639 Cerebral infarction, unspecified: Secondary | ICD-10-CM

## 2024-02-06 DIAGNOSIS — J690 Pneumonitis due to inhalation of food and vomit: Secondary | ICD-10-CM | POA: Diagnosis not present

## 2024-02-06 DIAGNOSIS — K219 Gastro-esophageal reflux disease without esophagitis: Secondary | ICD-10-CM

## 2024-02-06 LAB — CBC
HCT: 30.3 % — ABNORMAL LOW (ref 39.0–52.0)
Hemoglobin: 9.7 g/dL — ABNORMAL LOW (ref 13.0–17.0)
MCH: 28.3 pg (ref 26.0–34.0)
MCHC: 32 g/dL (ref 30.0–36.0)
MCV: 88.3 fL (ref 80.0–100.0)
Platelets: 199 10*3/uL (ref 150–400)
RBC: 3.43 MIL/uL — ABNORMAL LOW (ref 4.22–5.81)
RDW: 14.1 % (ref 11.5–15.5)
WBC: 5.1 10*3/uL (ref 4.0–10.5)
nRBC: 0 % (ref 0.0–0.2)

## 2024-02-06 LAB — LEGIONELLA PNEUMOPHILA SEROGP 1 UR AG: L. pneumophila Serogp 1 Ur Ag: NEGATIVE

## 2024-02-06 LAB — RENAL FUNCTION PANEL
Albumin: 2.9 g/dL — ABNORMAL LOW (ref 3.5–5.0)
Anion gap: 11 (ref 5–15)
BUN: 42 mg/dL — ABNORMAL HIGH (ref 8–23)
CO2: 26 mmol/L (ref 22–32)
Calcium: 8.5 mg/dL — ABNORMAL LOW (ref 8.9–10.3)
Chloride: 100 mmol/L (ref 98–111)
Creatinine, Ser: 10.19 mg/dL — ABNORMAL HIGH (ref 0.61–1.24)
GFR, Estimated: 5 mL/min — ABNORMAL LOW (ref >60)
Glucose, Bld: 77 mg/dL (ref 70–99)
Phosphorus: 4.6 mg/dL (ref 2.5–4.6)
Potassium: 4.3 mmol/L (ref 3.5–5.1)
Sodium: 137 mmol/L (ref 135–145)

## 2024-02-06 LAB — HEPATITIS B SURFACE ANTIBODY, QUANTITATIVE: Hep B S AB Quant (Post): 108 m[IU]/mL

## 2024-02-06 MED ORDER — AMOXICILLIN-POT CLAVULANATE 875-125 MG PO TABS: 1.0000 | ORAL_TABLET | Freq: Two times a day (BID) | ORAL | 0 refills | Status: AC

## 2024-02-06 MED ORDER — PROCHLORPERAZINE MALEATE 10 MG PO TABS
10.0000 mg | ORAL_TABLET | Freq: Four times a day (QID) | ORAL | 0 refills | Status: DC | PRN
Start: 2024-02-06 — End: 2024-05-28

## 2024-02-06 MED ORDER — PANTOPRAZOLE SODIUM 40 MG PO TBEC: 40.0000 mg | DELAYED_RELEASE_TABLET | Freq: Every day | ORAL | 2 refills | Status: DC

## 2024-02-06 MED ORDER — HEPARIN SODIUM (PORCINE) 1000 UNIT/ML IJ SOLN
INTRAMUSCULAR | Status: AC
  Filled 2024-02-06: qty 4

## 2024-02-06 MED ORDER — HEPARIN SODIUM (PORCINE) 1000 UNIT/ML DIALYSIS: 20.0000 [IU]/kg | INTRAMUSCULAR | Status: DC | PRN

## 2024-02-06 MED ORDER — CLONIDINE 0.2 MG/24HR TD PTWK: 0.2000 mg | MEDICATED_PATCH | TRANSDERMAL | 2 refills | Status: DC

## 2024-02-06 MED ORDER — HYDRALAZINE HCL 100 MG PO TABS: 100.0000 mg | ORAL_TABLET | Freq: Three times a day (TID) | ORAL | 3 refills | Status: DC

## 2024-02-06 MED ORDER — HYDROXYZINE HCL 25 MG PO TABS
25.0000 mg | ORAL_TABLET | Freq: Three times a day (TID) | ORAL | Status: DC | PRN
  Administered 2024-02-06: 25 mg via ORAL
  Filled 2024-02-06: qty 1

## 2024-02-06 NOTE — Plan of Care (Signed)
  Problem: Education: Goal: Knowledge of General Education information will improve Description: Including pain rating scale, medication(s)/side effects and non-pharmacologic comfort measures Outcome: Progressing   Problem: Nutrition: Goal: Adequate nutrition will be maintained Outcome: Progressing   Problem: Pain Managment: Goal: General experience of comfort will improve and/or be controlled Outcome: Progressing

## 2024-02-06 NOTE — Plan of Care (Signed)
  Problem: Education: Goal: Knowledge of General Education information will improve Description: Including pain rating scale, medication(s)/side effects and non-pharmacologic comfort measures Outcome: Progressing   Problem: Coping: Goal: Level of anxiety will decrease Outcome: Progressing   Problem: Pain Managment: Goal: General experience of comfort will improve and/or be controlled Outcome: Progressing

## 2024-02-06 NOTE — Evaluation (Signed)
 Physical Therapy Evaluation Patient Details Name: Bryce Miller MRN: [id number] DOB: 1960-10-18 Today's Date: 02/06/2024  History of Present Illness  Per MD: Bryce Miller is a 63 y.o. male with medical history significant of ESRD, dialysis dependent on TTS schedule, prior CVA with left-sided spinal paralysis, history of hypertension, seizure disorder, hypertension, GERD was sent to the ED from dialysis center after he was noted to have vomited and concern for possible aspiration. Apparently patient's oxygen saturation was 88% when EMS arrived at the dialysis center, he was placed on 4 L nasal cannula.  According to ED physician, patient endorses feeling fine yesterday but woke up today not feeling good and states he feels cold.  However during my encounter, patient appears to be very poor historian.  He is often not responding to any questions until asked 2-3 times and very loud.  Patient is only able to tell me his name and that he is in the hospital but unable to tell me which hospital, month or the year.  Per ED RN, patient lives with his sister and nephew.  And per her, the sister herself was very hard of hearing so nephew was the one who was answering some questions.  However none of those are present during my encounter.  I was informed by RN that patient has been confused lately for couple of weeks.  He does have some dementia at baseline apparently.    Clinical Impression  Pt cooperative with therapy.  Does not appear to be agitated.  Trial of keeping hand mitts off while aide is in room.  PT has decreased strength, decreased balance and currently has difficulty in ambulation.  Therapist recommends SNF placement as pt was ambulating with a RW in the home prior to admission.          If plan is discharge home, recommend the following: Two people to help with walking and/or transfers;A lot of help with bathing/dressing/bathroom;Assistance with cooking/housework;Help with stairs or ramp for  entrance   Can travel by private vehicle   No    Equipment Recommendations None recommended by PT  Recommendations for Other Services  OT consult    Functional Status Assessment Patient has had a recent decline in their functional status and demonstrates the ability to make significant improvements in function in a reasonable and predictable amount of time.     Precautions / Restrictions Precautions Precautions: Fall Restrictions Weight Bearing Restrictions Per Provider Order: No      Mobility  Bed Mobility Overal bed mobility: Needs Assistance Bed Mobility: Sidelying to Sit, Supine to Sit   Sidelying to sit: Mod assist Supine to sit: Mod assist          Transfers Overall transfer level: Needs assistance   Transfers: Sit to/from Stand Sit to Stand: Max assist           General transfer comment: unable to come completely upright; 80% fatigues easily and sits back down.    Ambulation/Gait               General Gait Details: unable at this time.     Balance   Sitting 3-/5; standing 2/5       Pertinent Vitals/Pain Pain Assessment Pain Assessment: No/denies pain    Home Living Family/patient expects to be discharged to:: Private residence Living Arrangements: Spouse/significant other Available Help at Discharge: Family;Friend(s);Home health;Available 24 hours/day Type of Home: House Home Access: Ramped entrance       Home Layout: One level Home Equipment: Rolling  Walker (2 wheels)      Prior Function Prior Level of Function : Needs assist             Mobility Comments: Per familt pt was able to walk from bedroom to living room with a rolling walker ADLs Comments: has an aide that comes in dailty     Extremity/Trunk Assessment        Lower Extremity Assessment Lower Extremity Assessment: LLE deficits/detail LLE Deficits / Details: quad 1/5; hip and hamstring 2/5       Communication   Communication Communication: No  apparent difficulties    Cognition Arousal: Lethargic Behavior During Therapy: Flat affect   PT - Cognitive impairments: Attention, Safety/Judgement, Difficult to assess                         Following commands: Impaired Following commands impaired: Only follows one step commands consistently     Cueing Cueing Techniques: Verbal cues, Gestural cues     General Comments General comments (skin integrity, edema, etc.): sitting balance 3-/5; standing balance 2/5    Exercises General Exercises - Lower Extremity Long Arc Quad: Both, 10 reps (AA on LT) Heel Slides: Both, 10 reps Hip ABduction/ADduction: Both, 5 reps   Assessment/Plan    PT Assessment Patient needs continued PT services  PT Problem List Decreased strength;Decreased balance;Decreased activity tolerance       PT Treatment Interventions Gait training;Therapeutic activities;Therapeutic exercise;Balance training    PT Goals (Current goals can be found in the Care Plan section)       Frequency Min 3X/week        AM-PAC PT 6 Clicks Mobility  Outcome Measure Help needed turning from your back to your side while in a flat bed without using bedrails?: A Lot Help needed moving from lying on your back to sitting on the side of a flat bed without using bedrails?: A Lot Help needed moving to and from a bed to a chair (including a wheelchair)?: A Lot Help needed standing up from a chair using your arms (e.g., wheelchair or bedside chair)?: A Lot Help needed to walk in hospital room?: Total Help needed climbing 3-5 steps with a railing? : Total 6 Click Score: 10    End of Session Equipment Utilized During Treatment: Gait belt Activity Tolerance: Patient limited by fatigue Patient left: in bed;with call bell/phone within reach;with family/visitor present;with nursing/sitter in room Nurse Communication: Mobility status PT Visit Diagnosis: Unsteadiness on feet (R26.81);Muscle weakness (generalized)  (M62.81);Difficulty in walking, not elsewhere classified (R26.2)    Time: 1320-1405 PT Time Calculation (min) (ACUTE ONLY): 45 min   Charges:   PT Evaluation $PT Eval Moderate Complexity: 1 Mod PT Treatments $Therapeutic Activity: 8-22 mins PT General Charges $$ ACUTE PT VISIT: 1 Visit         Montie Metro, PT CLT [phone]  02/06/2024, 2:05 PM

## 2024-02-06 NOTE — Evaluation (Signed)
 Clinical/Bedside Swallow Evaluation Patient Details  Name: Bryce Miller MRN: [id number] Date of Birth: 1961/06/20  Today's Date: 02/06/2024 Time: SLP Start Time (ACUTE ONLY): 1542 SLP Stop Time (ACUTE ONLY): 1603 SLP Time Calculation (min) (ACUTE ONLY): 21 min  Past Medical History:  Past Medical History:  Diagnosis Date   Constipation    ESRD (end stage renal disease) (HCC)    [place]- TTHS   GERD (gastroesophageal reflux disease)    Hypertension    Paraplegic spinal paralysis (HCC)    Pre-diabetes    Seizure (HCC)    Stroke (HCC)    left side weakness, memory loss   Past Surgical History:  Past Surgical History:  Procedure Laterality Date   A/V FISTULAGRAM N/A 05/09/2020   Procedure: A/V FISTULAGRAM;  Surgeon: Sheree Penne Bruckner, MD;  Location: [facility] INVASIVE CV LAB;  Service: Cardiovascular;  Laterality: N/A;   A/V SHUNTOGRAM Right 03/21/2018   Procedure: A/V SHUNTOGRAM;  Surgeon: Harvey Carlin BRAVO, MD;  Location: [facility] INVASIVE CV LAB;  Service: Cardiovascular;  Laterality: Right;   AV FISTULA PLACEMENT Right 12/06/2015   Procedure: ARTERIOVENOUS (AV) FISTULA CREATION-RIGHT upper arm ;  Surgeon: Redell LITTIE Door, MD;  Location: [facility] OR;  Service: Vascular;  Laterality: Right;   AV FISTULA PLACEMENT Left 02/28/2021   Procedure: LEFT ARM ARTERIOVENOUS (AV) FISTULA CREATION;  Surgeon: Oris Krystal FALCON, MD;  Location: [facility] ORS;  Service: Vascular;  Laterality: Left;   BASCILIC VEIN TRANSPOSITION Right 08/29/2020   Procedure: RIGHT FIRST STAGE BASCILIC VEIN TRANSPOSITION VERSUS ARTERIOVENOUS GRAFT;  Surgeon: Eliza Bruckner RAMAN, MD;  Location: [facility] OR;  Service: Vascular;  Laterality: Right;   COLONOSCOPY N/A 09/30/2014   Procedure: COLONOSCOPY;  Surgeon: Lamar CHRISTELLA Hollingshead, MD;  Location: [facility] ENDO SUITE;  Service: Endoscopy;  Laterality: N/A;  2:30 AM - moved to 1:10 - Ginger to notify pt   CSF SHUNT     INSERTION OF DIALYSIS CATHETER Right 07/05/2020   Procedure: INSERTION OF TUNNEL  DIALYSIS  CATHETER USING 19CM PRECISION CHRONIC CATHETER;  Surgeon: Eliza Bruckner RAMAN, MD;  Location: [facility] OR;  Service: Vascular;  Laterality: Right;   IR DIALY SHUNT INTRO NEEDLE/INTRACATH INITIAL W/IMG LEFT Left 06/01/2020   IR FLUORO GUIDE CV LINE RIGHT  06/01/2020   LIGATION ARTERIOVENOUS GORTEX GRAFT Right 07/05/2020   Procedure: LIGATION EXCISION  ARTERIOVENOUS GORTEX GRAFT;  Surgeon: Eliza Bruckner RAMAN, MD;  Location: [facility] OR;  Service: Vascular;  Laterality: Right;   PERIPHERAL VASCULAR BALLOON ANGIOPLASTY Right 03/21/2018   Procedure: PERIPHERAL VASCULAR BALLOON ANGIOPLASTY;  Surgeon: Harvey Carlin BRAVO, MD;  Location: [facility] INVASIVE CV LAB;  Service: Cardiovascular;  Laterality: Right;  AV GRAFT   PERIPHERAL VASCULAR INTERVENTION Right 05/09/2020   Procedure: PERIPHERAL VASCULAR INTERVENTION;  Surgeon: Sheree Penne Bruckner, MD;  [facility];  Service: Cardiovascular;  Laterality: Right;   HPI:  Jaydee Conran is a 63 y.o. male with medical history significant of ESRD, dialysis dependent on TTS schedule, prior CVA with left-sided spinal paralysis, history of hypertension, seizure disorder, hypertension, GERD was sent to the ED from dialysis center after he was noted to have vomited and concern for possible aspiration.    Assessment / Plan / Recommendation  Clinical Impression  Pt seen for BSE while receiving dialysis, as Pt is scheduled to d/c later today. Pt denies difficulty swallowing and is agreeable to clinical swallow evaluation. Oral motor evaluation is WNL. Pt consumed ice chips, water  via straw sips, puree, and graham crackers without incident. Pt did not exhibit signs  of aspiration and no reports of globus. Recommend regular textures and thin liquids with standard aspiration and reflux precautions, PO medication whole with water . No further SLP services at this time. SLP will sign off. SLP Visit Diagnosis: Dysphagia, unspecified (R13.10)    Aspiration Risk  No  limitations    Diet Recommendation Regular;Thin liquid    Liquid Administration via: Cup;Straw Medication Administration: Whole meds with liquid Supervision: Patient able to self feed Postural Changes: Seated upright at 90 degrees;Remain upright for at least 30 minutes after po intake    Other  Recommendations Oral Care Recommendations: Oral care BID     Assistance Recommended at Discharge    Functional Status Assessment Patient has not had a recent decline in their functional status  Frequency and Duration            Prognosis Prognosis for improved oropharyngeal function: Good      Swallow Study   General Date of Onset: 02/04/24 HPI: Bryce Miller is a 63 y.o. male with medical history significant of ESRD, dialysis dependent on TTS schedule, prior CVA with left-sided spinal paralysis, history of hypertension, seizure disorder, hypertension, GERD was sent to the ED from dialysis center after he was noted to have vomited and concern for possible aspiration. Type of Study: Bedside Swallow Evaluation Previous Swallow Assessment: N/A Diet Prior to this Study: Regular;Thin liquids (Level 0) Temperature Spikes Noted: No Respiratory Status: Room air History of Recent Intubation: No Behavior/Cognition: Alert;Cooperative;Pleasant mood Oral Cavity Assessment: Within Functional Limits Oral Care Completed by SLP: Recent completion by staff Oral Cavity - Dentition: Adequate natural dentition Vision: Functional for self-feeding Self-Feeding Abilities: Able to feed self;Needs set up Patient Positioning: Upright in bed Baseline Vocal Quality: Normal;Low vocal intensity Volitional Cough: Strong Volitional Swallow: Able to elicit    Oral/Motor/Sensory Function Overall Oral Motor/Sensory Function: Within functional limits   Ice Chips Ice chips: Within functional limits Presentation: Spoon   Thin Liquid Thin Liquid: Within functional limits Presentation: Cup;Straw    Nectar Thick Nectar  Thick Liquid: Not tested   Honey Thick Honey Thick Liquid: Not tested   Puree Puree: Within functional limits Presentation: Spoon   Solid     Solid: Within functional limits Presentation: Self Fed     Thank you,  Lamar Candy, CCC-SLP [phone]  Brie Eppard 02/06/2024,4:31 PM

## 2024-02-06 NOTE — Progress Notes (Signed)
 All personal belongings have been returned. Discharge instructions have been given to patient and his sister Bryce Miller with no further questions. Patient was wheeled down to vehicle by NT and Press photographer.

## 2024-02-06 NOTE — Discharge Summary (Signed)
 Physician Discharge Summary   Patient: Bryce Miller MRN: [id number] DOB: 1961/02/10  Admit date:     02/04/2024  Discharge date: 02/06/24  Discharge Physician: Eric Nunnery   PCP: Carlette Benita Area, MD   Recommendations at discharge:  Repeat chest x-ray 8 in 6-8 weeks to assure resolution of infiltrates Reassess blood pressure and further adjust antihypertensive regimen as needed Repeat CBC to follow hemoglobin trend/stability   Discharge Diagnoses: Principal Problem:   Aspiration pneumonia (HCC) Active Problems:   Complication associated with dialysis catheter   Acute pulmonary edema (HCC)   Seizure (HCC)   Hypertensive urgency   Cerebrovascular accident [facility])   Gastroesophageal reflux disease   Brief Hospital admission narrative: Avedis Bevis is a 63 y.o. male with medical history significant of ESRD, dialysis dependent on TTS schedule, prior CVA with left-sided spinal paralysis, history of hypertension, seizure disorder, hypertension, GERD was sent to the ED from dialysis center after he was noted to have vomited and concern for possible aspiration.  Patient at time of admission apparently was confused, having difficulty to complete sentences, following commands and demonstrating inability to safely take p.o.'s.   Assessment and Plan: 1-nausea/vomiting/aspiration pneumonia - Currently afebrile; adequately following commands; in no acute distress and denying any nausea or vomiting. - Tolerating diet dysphagia 3 consistency without complications and taking medications with thin liquids. - Patient has passed bedside swallowing evaluation - Patient will be discharged on oral Augmentin to complete antibiotic therapy -Desaturation screening demonstrating no need for oxygen supplementation - Repeat chest x-ray in 6-8 weeks recommended to assure resolution of infiltrates. - Outpatient follow-up in 2 weeks with his PCP has been instructed.   2-ESRD - Status post hemodialysis  on 02/05/2024 and then again 02/06/2024 in order to get patient back on schedule (Tuesday-Thursday and Saturday) - Continue Renvela -Next hemodialysis treatment 02/08/2024 -Patient advised to maintain adequate hydration and to follow heart healthy/low-sodium diet.   3-GERD - Continue PPI -Prescription for as needed antiemetics has been provided   4-acute interstitial pulmonary edema - Vascular congestion and interstitial edema appreciated on chest x-ray at time of admission - Continue home Lasix  and volume management with dialysis. -Low-sodium diet and daily weight recommended   5-hypertensive urgency - Patient with blood pressure of 187/116 on arrival - Much improved and stabilized after transient use of nicardipine drip and providing hemodialysis. -Home antihypertensive agent has been adjusted - Continue to follow blood pressure control   6-prior history of stroke - No acute focal neurologic deficit appreciated - Physical therapy evaluation requested and recommendations given for a skilled mobile facility at discharge; patient family expressed having good social support and a home health aide to continue providing care for patient and pursuing rehabilitation without nursing home visit.   7-acute metabolic encephalopathy versus underlying dementia - Outpatient workup for dementia recommended - Mentation improved and essentially back to baseline at discharge - Acute metabolic encephalopathy in the setting of infection most likely playing a big role.   8-history of seizure - No seizure activity appreciated - Continue Keppra    9-anemia of chronic disease - Hemoglobin appears stable - No overt bleeding appreciated - IV iron and Epogen  therapy as per nephrology discretion.  Consultants: Nephrology service Procedures performed: See below for x-ray report Disposition: Home Diet recommendation: Heart healthy/low-sodium diet.  DISCHARGE MEDICATION: Allergies as of 02/06/2024   No Known  Allergies      Medication List     STOP taking these medications    cloNIDine 0.1 mg/24hr patch Commonly  known as: CATAPRES - Dosed in mg/24 hr Replaced by: cloNIDine 0.2 mg/24hr patch   omeprazole 20 MG capsule Commonly known as: PRILOSEC Replaced by: pantoprazole  40 MG tablet       TAKE these medications    amLODipine  5 MG tablet Commonly known as: NORVASC  Take 5 mg by mouth daily.   amoxicillin-clavulanate 875-125 MG tablet Commonly known as: AUGMENTIN Take 1 tablet by mouth 2 (two) times daily for 5 days.   cloNIDine 0.2 mg/24hr patch Commonly known as: CATAPRES - Dosed in mg/24 hr Place 1 patch (0.2 mg total) onto the skin once a week. Replaces: cloNIDine 0.1 mg/24hr patch   FLUoxetine  10 MG capsule Commonly known as: PROZAC  Take 10 mg by mouth daily.   furosemide  40 MG tablet Commonly known as: LASIX  Take 40 mg by mouth daily.   hydrALAZINE  100 MG tablet Commonly known as: APRESOLINE  Take 1 tablet (100 mg total) by mouth 3 (three) times daily. What changed:  when to take this Another medication with the same name was removed. Continue taking this medication, and follow the directions you see here.   levETIRAcetam  500 MG tablet Commonly known as: KEPPRA  Take 500 mg by mouth 2 (two) times daily.   losartan 100 MG tablet Commonly known as: COZAAR Take 50 mg by mouth daily.   metoprolol  succinate 50 MG 24 hr tablet Commonly known as: TOPROL -XL Take 50 mg by mouth daily.   pantoprazole  40 MG tablet Commonly known as: PROTONIX  Take 1 tablet (40 mg total) by mouth daily. Start taking on: February 07, 2024 Replaces: omeprazole 20 MG capsule   prochlorperazine 10 MG tablet Commonly known as: COMPAZINE Take 1 tablet (10 mg total) by mouth every 6 (six) hours as needed for nausea or vomiting.   sevelamer carbonate 800 MG tablet Commonly known as: RENVELA Take 800-1,600 mg by mouth See admin instructions. 1600 mg with meals, 800 mg with snacks         Follow-up Information     Fanta, Benita Area, MD. Schedule an appointment as soon as possible for a visit in 2 week(s).   Specialty: Internal Medicine Contact information: [address] [place] [address] [phone]                Discharge Exam: Filed Weights   02/05/24 1407 02/06/24 0500 02/06/24 1425  Weight: 86.3 kg 86.3 kg 86.2 kg   General exam: Alert, awake, adequately following commands; no fever, no distress and no requiring oxygen supplementation. Respiratory system: Improved air movement bilaterally; positive rhonchi appreciated on exam.  No crackles, no wheezing. Cardiovascular system:RRR. No rubs or gallops. Gastrointestinal system: Abdomen is nondistended, soft and nontender. No organomegaly or masses felt. Normal bowel sounds heard. Central nervous system: No new focal neurological deficits. Extremities: No cyanosis or clubbing. Skin: No petechiae. Psychiatry: Judgement and insight appear stable.  Flat affect Appreciated.   Condition at discharge: Stable and improved.  The results of significant diagnostics from this hospitalization (including imaging, microbiology, ancillary and laboratory) are listed below for reference.   Imaging Studies: ECHOCARDIOGRAM COMPLETE Result Date: 02/05/2024    ECHOCARDIOGRAM REPORT   Patient Name:   NAVI ERBER Date of Exam: 02/05/2024 Medical Rec #:  [id number]     Height:       71.0 in Accession #:    [id number]    Weight:       190.3 lb Date of Birth:  Dec 24, 1960     BSA:  2.064 m Patient Age:    63 years      BP:           154/86 mmHg Patient Gender: M             HR:           78 bpm. Exam Location:  [facility] Procedure: 2D Echo, Cardiac Doppler and Color Doppler (Both Spectral and Color            Flow Doppler were utilized during procedure). Indications:    CHF-Acute Diastolic I50.31  History:        Patient has no prior history of Echocardiogram examinations.                 Risk  Factors:Hypertension and Pre-diabetes (From Hx). ESRD (end                 stage renal disease) (HCC), Paraplegic spinal paralysis (HCC),                 Stroke (HCC) (From Hx).  Sonographer:    Aida Pizza RCS Referring Phys: [id number] RAVI PAHWANI IMPRESSIONS  1. Left ventricular ejection fraction, by estimation, is 55 to 60%. The left ventricle has normal function. Left ventricular endocardial border not optimally defined to evaluate regional wall motion. There is moderate asymmetric left ventricular hypertrophy of the basal segment. Left ventricular diastolic parameters are consistent with Grade II diastolic dysfunction (pseudonormalization).  2. Right ventricular systolic function is normal. The right ventricular size is normal. There is normal pulmonary artery systolic pressure. The estimated right ventricular systolic pressure is 29.6 mmHg.  3. Left atrial size was severely dilated.  4. The mitral valve is degenerative. Moderate mitral valve regurgitation.  5. The aortic valve is tricuspid. There is mild calcification of the aortic valve. Aortic valve regurgitation is not visualized. Aortic valve sclerosis is present, with no evidence of aortic valve stenosis.  6. Aortic dilatation noted. There is mild dilatation of the aortic root, measuring 40 mm.  7. The inferior vena cava is normal in size with greater than 50% respiratory variability, suggesting right atrial pressure of 3 mmHg. Comparison(s): No prior Echocardiogram. Cannot exclude LV apical wall motion abnormality, prominent trabeculation also present. Suggest limited study with Definity contrast to clairify. FINDINGS  Left Ventricle: Left ventricular ejection fraction, by estimation, is 55 to 60%. The left ventricle has normal function. Left ventricular endocardial border not optimally defined to evaluate regional wall motion. The left ventricular internal cavity size was normal in size. There is moderate asymmetric left ventricular hypertrophy of the  basal segment. Left ventricular diastolic parameters are consistent with Grade II diastolic dysfunction (pseudonormalization). Right Ventricle: The right ventricular size is normal. No increase in right ventricular wall thickness. Right ventricular systolic function is normal. There is normal pulmonary artery systolic pressure. The tricuspid regurgitant velocity is 2.58 m/s, and  with an assumed right atrial pressure of 3 mmHg, the estimated right ventricular systolic pressure is 29.6 mmHg. Left Atrium: Left atrial size was severely dilated. Right Atrium: Right atrial size was normal in size. Pericardium: There is no evidence of pericardial effusion. Mitral Valve: The mitral valve is degenerative in appearance. Moderate mitral valve regurgitation. Tricuspid Valve: The tricuspid valve is grossly normal. Tricuspid valve regurgitation is mild. Aortic Valve: The aortic valve is tricuspid. There is mild calcification of the aortic valve. There is mild aortic valve annular calcification. Aortic valve regurgitation is not visualized. Aortic valve sclerosis is present, with no  evidence of aortic valve stenosis. Pulmonic Valve: The pulmonic valve was grossly normal. Pulmonic valve regurgitation is trivial. Aorta: Aortic dilatation noted. There is mild dilatation of the aortic root, measuring 40 mm. Venous: The inferior vena cava is normal in size with greater than 50% respiratory variability, suggesting right atrial pressure of 3 mmHg. IAS/Shunts: No atrial level shunt detected by color flow Doppler. Additional Comments: 3D was performed not requiring image post processing on an independent workstation and was indeterminate.  LEFT VENTRICLE PLAX 2D LVIDd:         5.10 cm   Diastology LVIDs:         3.10 cm   LV e' medial:    5.00 cm/s LV PW:         1.20 cm   LV E/e' medial:  19.5 LV IVS:        1.60 cm   LV e' lateral:   5.66 cm/s LVOT diam:     2.30 cm   LV E/e' lateral: 17.2 LV SV:         80 LV SV Index:   39 LVOT Area:      4.15 cm  RIGHT VENTRICLE RV S prime:     14.80 cm/s TAPSE (M-mode): 2.3 cm LEFT ATRIUM              Index        RIGHT ATRIUM           Index LA diam:        3.00 cm  1.45 cm/m   RA Area:     19.70 cm LA Vol (A2C):   110.0 ml 53.29 ml/m  RA Volume:   61.20 ml  29.65 ml/m LA Vol (A4C):   120.0 ml 58.13 ml/m LA Biplane Vol: 115.0 ml 55.71 ml/m  AORTIC VALVE LVOT Vmax:   99.70 cm/s LVOT Vmean:  73.600 cm/s LVOT VTI:    0.193 m  AORTA Ao Root diam: 4.00 cm MITRAL VALVE                  TRICUSPID VALVE MV Area (PHT): 3.08 cm       TR Peak grad:   26.6 mmHg MV Decel Time: 246 msec       TR Vmax:        258.00 cm/s MR Peak grad:    111.9 mmHg MR Mean grad:    69.0 mmHg    SHUNTS MR Vmax:         529.00 cm/s  Systemic VTI:  0.19 m MR Vmean:        389.0 cm/s   Systemic Diam: 2.30 cm MR PISA:         1.57 cm MR PISA Eff ROA: 8 mm MR PISA Radius:  0.50 cm MV E velocity: 97.40 cm/s MV A velocity: 81.60 cm/s MV E/A ratio:  1.19 Jayson Sierras MD Electronically signed by Jayson Sierras MD Signature Date/Time: 02/05/2024/4:46:14 PM    Final    DG Chest Port 1 View Result Date: 02/05/2024 CLINICAL DATA:  Check dialysis catheter placement.  Hypertension. EXAM: PORTABLE CHEST 1 VIEW COMPARISON:  Yesterday FINDINGS: Right-sided dialysis catheter tip is similar at mid to high right atrium. Midline trachea. Mild cardiomegaly. Atherosclerosis in the transverse aorta. No pleural effusion or pneumothorax. Moderate interstitial edema, increased. Persistent bibasilar airspace disease medially. IMPRESSION: No change in position of right-sided dialysis catheter. Minimal increase in moderate congestive heart failure. Persistent bibasilar airspace disease, most likely atelectasis. Electronically  Signed   By: Rockey Kilts M.D.   On: 02/05/2024 08:30   DG Chest Portable 1 View Result Date: 02/04/2024 CLINICAL DATA:  Emesis, hypoxia, concern for aspiration EXAM: PORTABLE CHEST 1 VIEW COMPARISON:  05/28/2023 FINDINGS: Single  frontal view of the chest demonstrates right internal jugular dialysis catheter unchanged. Cardiac silhouette remains enlarged. Overall worsening volume status, with increased central pulmonary vascular congestion and diffuse interstitial and ground-glass opacities consistent with pulmonary edema. Streaky retrocardiac consolidation could reflect airspace disease or atelectasis. No effusion or pneumothorax. IMPRESSION: 1. Overall findings consistent with worsening volume status and developing pulmonary edema. 2. Streaky left lower lobe consolidation in the retrocardiac region could reflect aspiration given clinical presentation, pneumonia, or atelectasis. Electronically Signed   By: Ozell Daring M.D.   On: 02/04/2024 10:30    Microbiology: Results for orders placed or performed during the hospital encounter of 02/04/24  Blood culture (routine x 2)     Status: None (Preliminary result)   Collection Time: 02/04/24 10:29 AM   Specimen: BLOOD  Result Value Ref Range Status   Specimen Description BLOOD BLOOD LEFT ARM AEROBIC BOTTLE ONLY  Final   Special Requests   Final    BOTTLES DRAWN AEROBIC ONLY Blood Culture results may not be optimal due to an inadequate volume of blood received in culture bottles   Culture   Final    NO GROWTH < 24 HOURS Performed at [facility], [address] [place], [place] [address]    Report Status PENDING  Incomplete  Blood culture (routine x 2)     Status: None (Preliminary result)   Collection Time: 02/04/24 10:29 AM   Specimen: BLOOD  Result Value Ref Range Status   Specimen Description BLOOD BLOOD LEFT ARM LAC  Final   Special Requests   Final    BOTTLES DRAWN AEROBIC AND ANAEROBIC Blood Culture adequate volume   Culture   Final    NO GROWTH < 24 HOURS Performed at [facility], [address], [place], [place] [address]    Report Status PENDING  Incomplete  Resp panel by RT-PCR (RSV, Flu A&B, Covid) Anterior Nasal Swab     Status: None   Collection Time:  02/04/24  4:18 PM   Specimen: Anterior Nasal Swab  Result Value Ref Range Status   SARS Coronavirus 2 by RT PCR NEGATIVE NEGATIVE Final    Comment: (NOTE) SARS-CoV-2 target nucleic acids are NOT DETECTED.  The SARS-CoV-2 RNA is generally detectable in upper respiratory specimens during the acute phase of infection. The lowest concentration of SARS-CoV-2 viral copies this assay can detect is 138 copies/mL. A negative result does not preclude SARS-Cov-2 infection and should not be used as the sole basis for treatment or other patient management decisions. A negative result may occur with  improper specimen collection/handling, submission of specimen other than nasopharyngeal swab, presence of viral mutation(s) within the areas targeted by this assay, and inadequate number of viral copies(<138 copies/mL). A negative result must be combined with clinical observations, patient history, and epidemiological information. The expected result is Negative.  Fact Sheet for Patients:  BloggerCourse.com  Fact Sheet for Healthcare Providers:  SeriousBroker.it  This test is no t yet approved or cleared by the [place]  FDA and  has been authorized for detection and/or diagnosis of SARS-CoV-2 by FDA under an Emergency Use Authorization (EUA). This EUA will remain  in effect (meaning this test can be used) for the duration of the COVID-19 declaration under Section 564(b)(1) of  the Act, 21 U.S.C.section 360bbb-3(b)(1), unless the authorization is terminated  or revoked sooner.       Influenza A by PCR NEGATIVE NEGATIVE Final   Influenza B by PCR NEGATIVE NEGATIVE Final    Comment: (NOTE) The Xpert Xpress SARS-CoV-2/FLU/RSV plus assay is intended as an aid in the diagnosis of influenza from Nasopharyngeal swab specimens and should not be used as a sole basis for treatment. Nasal washings and aspirates are unacceptable for Xpert Xpress  SARS-CoV-2/FLU/RSV testing.  Fact Sheet for Patients: BloggerCourse.com  Fact Sheet for Healthcare Providers: SeriousBroker.it  This test is not yet approved or cleared by the [place]  FDA and has been authorized for detection and/or diagnosis of SARS-CoV-2 by FDA under an Emergency Use Authorization (EUA). This EUA will remain in effect (meaning this test can be used) for the duration of the COVID-19 declaration under Section 564(b)(1) of the Act, 21 U.S.C. section 360bbb-3(b)(1), unless the authorization is terminated or revoked.     Resp Syncytial Virus by PCR NEGATIVE NEGATIVE Final    Comment: (NOTE) Fact Sheet for Patients: BloggerCourse.com  Fact Sheet for Healthcare Providers: SeriousBroker.it  This test is not yet approved or cleared by the [place]  FDA and has been authorized for detection and/or diagnosis of SARS-CoV-2 by FDA under an Emergency Use Authorization (EUA). This EUA will remain in effect (meaning this test can be used) for the duration of the COVID-19 declaration under Section 564(b)(1) of the Act, 21 U.S.C. section 360bbb-3(b)(1), unless the authorization is terminated or revoked.  Performed at [facility], [address], [place], [place] [address]   MRSA Next Gen by PCR, Nasal     Status: None   Collection Time: 02/04/24  6:37 PM   Specimen: Nasal Mucosa; Nasal Swab  Result Value Ref Range Status   MRSA by PCR Next Gen NOT DETECTED NOT DETECTED Final    Comment: (NOTE) The GeneXpert MRSA Assay (FDA approved for NASAL specimens only), is one component of a comprehensive MRSA colonization surveillance program. It is not intended to diagnose MRSA infection nor to guide or monitor treatment for MRSA infections. Test performance is not FDA approved in patients less than 22 years old. Performed at [facility], [address] [place],  [place] [address]     Labs: CBC: Recent Labs  Lab 02/04/24 1029 02/05/24 0438 02/06/24 1522  WBC 6.2 5.6 5.1  NEUTROABS 4.7  --   --   HGB 11.7* 10.3* 9.7*  HCT 35.0* 33.1* 30.3*  MCV 87.7 88.5 88.3  PLT 218 212 199   Basic Metabolic Panel: Recent Labs  Lab 02/04/24 1029 02/04/24 1248 02/05/24 0438 02/06/24 1522  NA 140  --  140 137  K 4.2  --  4.3 4.3  CL 102  --  103 100  CO2 25  --  25 26  GLUCOSE 92  --  94 77  BUN 52*  --  60* 42*  CREATININE 11.56*  --  13.37* 10.19*  CALCIUM  9.3  --  9.0 8.5*  MG  --  2.1  --   --   PHOS  --   --   --  4.6   Liver Function Tests: Recent Labs  Lab 02/04/24 1029 02/06/24 1522  AST 17  --   ALT 26  --   ALKPHOS 54  --   BILITOT 0.9  --   PROT 7.4  --   ALBUMIN 3.5 2.9*   CBG: No results for input(s): GLUCAP in the  last 168 hours.  Discharge time spent: greater than 30 minutes.  Signed: Eric Nunnery, MD [facility] 02/06/2024

## 2024-02-06 NOTE — TOC Initial Note (Signed)
 Transition of Care [facility]) - Initial/Assessment Note    Patient Details  Name: Bryce Miller MRN: [id number] Date of Birth: February 18, 1961  [facility] CM/SW Contact:    Lucie Lunger, LCSWA Phone Number: 02/06/2024, 3:39 PM  Clinical Narrative:                 CSW updated that PT is recommending SNF. CSW spoke with pt who was agreeable to CSW speaking with his sister. CSW spoke to pts sister who states that he has an aide in the home and prefers for him to return home and does not want a SNF referral sent out. Pts sister states pt will return home at D/C with current services. TOC to follow.   Expected Discharge Plan: Home/Self Care Barriers to Discharge: Continued Medical Work up   Patient Goals and CMS Choice Patient states their goals for this hospitalization and ongoing recovery are:: return home CMS Medicare.gov Compare Post Acute Care list provided to:: Patient Choice offered to / list presented to : Patient, Sibling      Expected Discharge Plan and Services In-house Referral: Clinical Social Work Discharge Planning Services: CM Consult Post Acute Care Choice: Resumption of Svcs/PTA Provider Living arrangements for the past 2 months: Single Family Home                                      Prior Living Arrangements/Services Living arrangements for the past 2 months: Single Family Home Lives with:: Self, Relatives Patient language and need for interpreter reviewed:: Yes Do you feel safe going back to the place where you live?: Yes      Need for Family Participation in Patient Care: Yes (Comment) Care giver support system in place?: Yes (comment)   Criminal Activity/Legal Involvement Pertinent to Current Situation/Hospitalization: No - Comment as needed  Activities of Daily Living   ADL Screening (condition at time of admission) Independently performs ADLs?: No Does the patient have a NEW difficulty with bathing/dressing/toileting/self-feeding that  is expected to last >3 days?: No Does the patient have a NEW difficulty with getting in/out of bed, walking, or climbing stairs that is expected to last >3 days?: No Does the patient have a NEW difficulty with communication that is expected to last >3 days?: No Is the patient deaf or have difficulty hearing?: No Does the patient have difficulty seeing, even when wearing glasses/contacts?: No Does the patient have difficulty concentrating, remembering, or making decisions?: No  Permission Sought/Granted                  Emotional Assessment Appearance:: Appears stated age       Alcohol / Substance Use: Not Applicable Psych Involvement: No (comment)  Admission diagnosis:  Aspiration pneumonia (HCC) [J69.0] Acute pulmonary edema (HCC) [J81.0] ESRD (end stage renal disease) (HCC) [N18.6] Patient Active Problem List   Diagnosis Date Noted   Complication associated with dialysis catheter 02/05/2024   Aspiration pneumonia (HCC) 02/04/2024   ESRD (end stage renal disease) (HCC) 11/19/2017   Fever 11/19/2017   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   Sepsis (HCC) 11/19/2017   Chronic kidney disease (CKD), stage IV (severe) (HCC) 01/19/2016   Colon cancer screening    PCP:  Carlette Benita Area, MD Pharmacy:   [facility] [phone] - [place], [place] - [address] AT [facility] [address] [place] [address] Phone: [phone] Fax: [phone]  Social Drivers of Health (SDOH) Social History: SDOH Screenings   Food Insecurity: No Food Insecurity (02/04/2024)  Housing: Low Risk  (02/04/2024)  Transportation Needs: No Transportation Needs (02/04/2024)  Utilities: Not At Risk (02/04/2024)  Tobacco Use: Low Risk  (02/04/2024)   SDOH Interventions:     Readmission Risk Interventions    02/05/2024   11:22 AM  Readmission Risk Prevention Plan  Medication Screening Complete  Transportation Screening Complete

## 2024-02-06 NOTE — Progress Notes (Signed)
   HEMODIALYSIS TREATMENT NOTE:  Uneventful 3 hour heparin -free treatment completed.  Goal met: 2 liters removed.  No interruption in UF.  All blood was returned.  Post-HD:  02/06/24 1800  Vitals  Temp 97.9 F (36.6 C)  Temp Source Axillary  BP (!) 167/92  MAP (mmHg) 123  BP Location Right Leg  BP Method Automatic  Patient Position (if appropriate) Lying  Pulse Rate 68  Pulse Rate Source Monitor  ECG Heart Rate 66  Resp 18  Weight 84.1 kg  Type of Weight Post-Dialysis  Post Treatment  Dialyzer Clearance Lightly streaked  Hemodialysis Intake (mL) 0 mL  Liters Processed 54.1  Fluid Removed (mL) 2000 mL  Tolerated HD Treatment Yes  Post-Hemodialysis Comments Goal met    Jon Laos, RN [facility]

## 2024-02-06 NOTE — Progress Notes (Signed)
 Admit: 02/04/2024 LOS: 1  67M ESRD TTS [facility] with AHRF / Aspiration PNA  Subjective:  HD yesterday 2.6L UF TDC secured by Dr. Kallie Awake, alert this morning, defensive affect  07/02 0701 - 07/03 0700 In: 600.4 [I.V.:250.4; IV Piggyback:350] Out: 2500   Filed Weights   02/05/24 1015 02/05/24 1407 02/06/24 0500  Weight: 88.9 kg 86.3 kg 86.3 kg    Scheduled Meds:  amLODipine   5 mg Oral Daily   calcitRIOL  3 mcg Oral Q M,W,F-HD   Chlorhexidine  Gluconate Cloth  6 each Topical Q0600   cloNIDine  0.1 mg Transdermal Weekly   FLUoxetine   10 mg Oral Daily   furosemide   40 mg Oral Daily   heparin   5,000 Units Subcutaneous Q8H   hydrALAZINE   100 mg Oral BID   hydrALAZINE   50 mg Oral BID   levETIRAcetam   500 mg Oral BID   lidocaine  (PF)  30 mL Intradermal Once   losartan  50 mg Oral Daily   metoprolol  succinate  50 mg Oral Daily   pantoprazole   40 mg Oral Daily   Continuous Infusions:  azithromycin Stopped (02/05/24 1834)   cefTRIAXone (ROCEPHIN)  IV Stopped (02/05/24 1910)   niCARDipine 5 mg/hr (02/06/24 0615)   PRN Meds:.acetaminophen  **OR** acetaminophen , alteplase, heparin , labetalol , ondansetron  **OR** ondansetron  (ZOFRAN ) IV  Current Labs: reviewed   Physical Exam:  Blood pressure (!) 144/80, pulse 80, temperature 98.4 F (36.9 C), temperature source Oral, resp. rate 15, height 5' 11 (1.803 m), weight 86.3 kg, SpO2 95%. General appearance: alert, refusing to answer some questions stating that he already has rhythm, and no distress Head: Normocephalic, without obvious abnormality, atraumatic Resp: Clear bilaterally Cardio: regular rate and rhythm, S1, S2 normal, no murmur, click, rub or gallop GI: soft, non-tender; bowel sounds normal; no masses,  no organomegaly Extremities: extremities normal, atraumatic, no cyanosis or edema  Dialysis Orders: Center:  [facility]  on TTS . EDW 90kg HD Bath 2K/2.5Ca  Time 4:00 Heparin  1000 units bolus then 1200  units/hr. Access RIJ TDC BFR 400 DFR 500    Zemplar 3 mcg po with HD three times per week Venofer  50 mg IV once a week Micera 30 mg IV q2 weeks  Sensipar 30 mg tiw    A AHRF secondary to aspiration pneumonia remains on nasal cannula, per TRH ESRD: Off schedule.  HD today back on schedule.  Goal UF 2 L Anemia of CKD: Hemoglobin stable 10.3 CKD-BMD: Continue outpatient regimen Vascular access: TDC secured 02/05/2024 by Dr. Kallie SQUIBB As above, dialysis today Medication Issues; Preferred narcotic agents for pain control are hydromorphone, fentanyl , and methadone. Morphine  should not be used.  Baclofen should be avoided Avoid oral sodium phosphate and magnesium  citrate based laxatives / bowel preps    Bernardino Gasman MD 02/06/2024, 9:50 AM  Recent Labs  Lab 02/04/24 1029 02/05/24 0438  NA 140 140  K 4.2 4.3  CL 102 103  CO2 25 25  GLUCOSE 92 94  BUN 52* 60*  CREATININE 11.56* 13.37*  CALCIUM  9.3 9.0   Recent Labs  Lab 02/04/24 1029 02/05/24 0438  WBC 6.2 5.6  NEUTROABS 4.7  --   HGB 11.7* 10.3*  HCT 35.0* 33.1*  MCV 87.7 88.5  PLT 218 212

## 2024-02-09 LAB — CULTURE, BLOOD (ROUTINE X 2)
Culture: NO GROWTH
Culture: NO GROWTH
Special Requests: ADEQUATE

## 2024-03-24 ENCOUNTER — Other Ambulatory Visit (HOSPITAL_COMMUNITY): Payer: Self-pay | Admitting: Gerontology

## 2024-03-24 DIAGNOSIS — J189 Pneumonia, unspecified organism: Secondary | ICD-10-CM

## 2024-03-27 ENCOUNTER — Ambulatory Visit (HOSPITAL_COMMUNITY)

## 2024-04-01 ENCOUNTER — Ambulatory Visit (HOSPITAL_COMMUNITY)
Admission: RE | Admit: 2024-04-01 | Discharge: 2024-04-01 | Disposition: A | Discharge status: Home / Self Care | Source: Ambulatory Visit | Attending: Gerontology | Admitting: Gerontology

## 2024-04-01 DIAGNOSIS — J189 Pneumonia, unspecified organism: Secondary | ICD-10-CM | POA: Insufficient documentation

## 2024-05-08 ENCOUNTER — Encounter (HOSPITAL_COMMUNITY): Admission: RE | Payer: Self-pay | Source: Home / Self Care

## 2024-05-08 ENCOUNTER — Ambulatory Visit (HOSPITAL_COMMUNITY): Admission: RE | Admit: 2024-05-08 | Source: Home / Self Care | Admitting: Vascular Surgery

## 2024-05-08 SURGERY — TUNNELLED CATHETER EXCHANGE
Anesthesia: LOCAL | Site: Chest

## 2024-05-13 ENCOUNTER — Ambulatory Visit (HOSPITAL_COMMUNITY): Admission: RE | Admit: 2024-05-13 | Source: Home / Self Care | Admitting: Vascular Surgery

## 2024-05-13 ENCOUNTER — Encounter (HOSPITAL_COMMUNITY): Admission: RE | Payer: Self-pay | Source: Home / Self Care

## 2024-05-13 SURGERY — TUNNELLED CATHETER EXCHANGE
Anesthesia: LOCAL

## 2024-05-18 ENCOUNTER — Encounter (HOSPITAL_COMMUNITY)
Admission: RE | Disposition: A | Discharge status: Home / Self Care | Payer: Self-pay | Source: Home / Self Care | Attending: Vascular Surgery

## 2024-05-18 ENCOUNTER — Other Ambulatory Visit: Payer: Self-pay

## 2024-05-18 ENCOUNTER — Ambulatory Visit (HOSPITAL_COMMUNITY)
Admission: RE | Admit: 2024-05-18 | Discharge: 2024-05-18 | Disposition: A | Discharge status: Home / Self Care | Attending: Vascular Surgery | Admitting: Vascular Surgery

## 2024-05-18 DIAGNOSIS — I120 Hypertensive chronic kidney disease with stage 5 chronic kidney disease or end stage renal disease: Secondary | ICD-10-CM | POA: Diagnosis not present

## 2024-05-18 DIAGNOSIS — Z992 Dependence on renal dialysis: Secondary | ICD-10-CM | POA: Diagnosis not present

## 2024-05-18 DIAGNOSIS — N186 End stage renal disease: Secondary | ICD-10-CM | POA: Diagnosis present

## 2024-05-18 DIAGNOSIS — T8241XA Breakdown (mechanical) of vascular dialysis catheter, initial encounter: Secondary | ICD-10-CM | POA: Insufficient documentation

## 2024-05-18 DIAGNOSIS — Y712 Prosthetic and other implants, materials and accessory cardiovascular devices associated with adverse incidents: Secondary | ICD-10-CM | POA: Insufficient documentation

## 2024-05-18 DIAGNOSIS — T8249XA Other complication of vascular dialysis catheter, initial encounter: Secondary | ICD-10-CM | POA: Diagnosis not present

## 2024-05-18 HISTORY — PX: TUNNELLED CATHETER EXCHANGE: CATH118373

## 2024-05-18 SURGERY — TUNNELLED CATHETER EXCHANGE
Anesthesia: LOCAL | Site: Chest

## 2024-05-18 MED ORDER — HEPARIN (PORCINE) IN NACL 1000-0.9 UT/500ML-% IV SOLN
INTRAVENOUS | Status: DC | PRN
  Administered 2024-05-18: 500 mL

## 2024-05-18 MED ORDER — HEPARIN SODIUM (PORCINE) 1000 UNIT/ML IJ SOLN
INTRAMUSCULAR | Status: AC
  Filled 2024-05-18: qty 10

## 2024-05-18 MED ORDER — MIDAZOLAM HCL 2 MG/2ML IJ SOLN
INTRAMUSCULAR | Status: AC
Start: 2024-05-18 — End: 2024-05-18
  Filled 2024-05-18: qty 2

## 2024-05-18 MED ORDER — MIDAZOLAM HCL 2 MG/2ML IJ SOLN
INTRAMUSCULAR | Status: DC | PRN
  Administered 2024-05-18: 1 mg via INTRAVENOUS

## 2024-05-18 MED ORDER — HEPARIN SODIUM (PORCINE) 1000 UNIT/ML IJ SOLN
INTRAMUSCULAR | Status: DC | PRN
Start: 2024-05-18 — End: 2024-05-18
  Administered 2024-05-18 (×2): 1900 [IU] via INTRAVENOUS

## 2024-05-18 MED ORDER — FENTANYL CITRATE (PF) 100 MCG/2ML IJ SOLN
INTRAMUSCULAR | Status: DC | PRN
  Administered 2024-05-18: 50 ug via INTRAVENOUS

## 2024-05-18 MED ORDER — LIDOCAINE HCL (PF) 1 % IJ SOLN
INTRAMUSCULAR | Status: AC
  Filled 2024-05-18: qty 30

## 2024-05-18 MED ORDER — FENTANYL CITRATE (PF) 100 MCG/2ML IJ SOLN
INTRAMUSCULAR | Status: AC
  Filled 2024-05-18: qty 2

## 2024-05-18 MED ORDER — LIDOCAINE HCL (PF) 1 % IJ SOLN
INTRAMUSCULAR | Status: DC | PRN
  Administered 2024-05-18: 15 mL

## 2024-05-18 SURGICAL SUPPLY — 4 items
CATH PALINDROME-P 23 W/VT (CATHETERS) IMPLANT
GLIDEWIRE STIFF .35X180X3 HYDR (WIRE) IMPLANT
MAT PREVALON FULL STRYKER (MISCELLANEOUS) IMPLANT
TRAY PV CATH (CUSTOM PROCEDURE TRAY) ×2 IMPLANT

## 2024-05-18 NOTE — H&P (Signed)
 [facility] [place]  ASSESSMENT / PLAN: 63 y.o. male with end-stage renal disease dialyzing through a tunneled dialysis catheter.  Catheter is malfunctioning.  Catheter exchange planned today Cath Lab  CHIEF COMPLAINT: End-stage renal disease, malfunctioning catheter  HISTORY OF PRESENT ILLNESS: Bryce Miller is a 63 y.o. male referred to clinic for evaluation of malfunctioning tunneled dialysis catheter.  The patient is minimally interactive during my history today.  His family member at the bedside reports catheter is not working well.  Catheter exchange has been requested.  Will plan to do this in the Cath Lab today.  Past Medical History:  Diagnosis Date   Constipation    ESRD (end stage renal disease) (HCC)    [place]- TTHS   GERD (gastroesophageal reflux disease)    Hypertension    Paraplegic spinal paralysis (HCC)    Pre-diabetes    Seizure (HCC)    Stroke (HCC)    left side weakness, memory loss    Past Surgical History:  Procedure Laterality Date   A/V FISTULAGRAM N/A 05/09/2020   Procedure: A/V FISTULAGRAM;  Surgeon: Sheree Penne Bruckner, MD;  Location: [facility] INVASIVE CV LAB;  Service: Cardiovascular;  Laterality: N/A;   A/V SHUNTOGRAM Right 03/21/2018   Procedure: A/V SHUNTOGRAM;  Surgeon: Harvey Carlin BRAVO, MD;  Location: [facility] INVASIVE CV LAB;  Service: Cardiovascular;  Laterality: Right;   AV FISTULA PLACEMENT Right 12/06/2015   Procedure: ARTERIOVENOUS (AV) FISTULA CREATION-RIGHT upper arm ;  Surgeon: Redell LITTIE Door, MD;  Location: [facility] OR;  Service: Vascular;  Laterality: Right;   AV FISTULA PLACEMENT Left 02/28/2021   Procedure: LEFT ARM ARTERIOVENOUS (AV) FISTULA CREATION;  Surgeon: Oris Krystal FALCON, MD;  Location: [facility] ORS;  Service: Vascular;  Laterality: Left;   BASCILIC VEIN TRANSPOSITION Right 08/29/2020   Procedure: RIGHT FIRST STAGE BASCILIC VEIN TRANSPOSITION VERSUS ARTERIOVENOUS GRAFT;  Surgeon: Eliza Bruckner RAMAN, MD;  Location: [facility] OR;   Service: Vascular;  Laterality: Right;   COLONOSCOPY N/A 09/30/2014   Procedure: COLONOSCOPY;  Surgeon: Lamar CHRISTELLA Hollingshead, MD;  Location: [facility] ENDO SUITE;  Service: Endoscopy;  Laterality: N/A;  2:30 AM - moved to 1:10 - Ginger to notify pt   CSF SHUNT     INSERTION OF DIALYSIS CATHETER Right 07/05/2020   Procedure: INSERTION OF TUNNEL  DIALYSIS CATHETER USING 19CM PRECISION CHRONIC CATHETER;  Surgeon: Eliza Bruckner RAMAN, MD;  Location: [facility] OR;  Service: Vascular;  Laterality: Right;   IR DIALY SHUNT INTRO NEEDLE/INTRACATH INITIAL W/IMG LEFT Left 06/01/2020   IR FLUORO GUIDE CV LINE RIGHT  06/01/2020   LIGATION ARTERIOVENOUS GORTEX GRAFT Right 07/05/2020   Procedure: LIGATION EXCISION  ARTERIOVENOUS GORTEX GRAFT;  Surgeon: Eliza Bruckner RAMAN, MD;  Location: [facility] OR;  Service: Vascular;  Laterality: Right;   PERIPHERAL VASCULAR BALLOON ANGIOPLASTY Right 03/21/2018   Procedure: PERIPHERAL VASCULAR BALLOON ANGIOPLASTY;  Surgeon: Harvey Carlin BRAVO, MD;  Location: [facility] INVASIVE CV LAB;  Service: Cardiovascular;  Laterality: Right;  AV GRAFT   PERIPHERAL VASCULAR INTERVENTION Right 05/09/2020   Procedure: PERIPHERAL VASCULAR INTERVENTION;  Surgeon: Sheree Penne Bruckner, MD;  Location: [facility] INVASIVE CV LAB;  Service: Cardiovascular;  Laterality: Right;    Family History  Problem Relation Age of Onset   Diabetes Mother     Social History   Socioeconomic History   Marital status: Divorced    Spouse name: Not on file   Number of children: Not on file   Years of education: Not on file   Highest education level: Not  on file  Occupational History   Not on file  Tobacco Use   Smoking status: Never   Smokeless tobacco: Never  Vaping Use   Vaping status: Never Used  Substance and Sexual Activity   Alcohol use: No   Drug use: Not Currently    Types: Cocaine    Comment: none since 2009   Sexual activity: Not Currently  Other Topics Concern   Not on file  Social History Narrative   Not on  file   Social Drivers of Health   Financial Resource Strain: Not on file  Food Insecurity: No Food Insecurity (02/04/2024)   Hunger Vital Sign    Worried About Running Out of Food in the Last Year: Never true    Ran Out of Food in the Last Year: Never true  Transportation Needs: No Transportation Needs (02/04/2024)   PRAPARE - Administrator, Civil Service (Medical): No    Lack of Transportation (Non-Medical): No  Physical Activity: Not on file  Stress: Not on file  Social Connections: Not on file  Intimate Partner Violence: Not At Risk (02/04/2024)   Humiliation, Afraid, Rape, and Kick questionnaire    Fear of Current or Ex-Partner: No    Emotionally Abused: No    Physically Abused: No    Sexually Abused: No    No Known Allergies  No current facility-administered medications for this encounter.    PHYSICAL EXAM Vitals:   05/18/24 1018 05/18/24 1023  BP: (!) 154/114 (!) 154/114  Pulse: 76 75  Resp: 12 16  Temp: 98.2 F (36.8 C)   TempSrc: Oral   SpO2: 96% 98%   No distress Regular rate and rhythm Unlabored breathing TDC in right chest  PERTINENT LABORATORY AND RADIOLOGIC DATA  Most recent CBC    Latest Ref Rng & Units 02/06/2024    3:22 PM 02/05/2024    4:38 AM 02/04/2024   10:29 AM  CBC  WBC 4.0 - 10.5 K/uL 5.1  5.6  6.2   Hemoglobin 13.0 - 17.0 g/dL 9.7  89.6  88.2   Hematocrit 39.0 - 52.0 % 30.3  33.1  35.0   Platelets 150 - 400 K/uL 199  212  218      Most recent CMP    Latest Ref Rng & Units 02/06/2024    3:22 PM 02/05/2024    4:38 AM 02/04/2024   10:29 AM  CMP  Glucose 70 - 99 mg/dL 77  94  92   BUN 8 - 23 mg/dL 42  60  52   Creatinine 0.61 - 1.24 mg/dL 89.80  86.62  88.43   Sodium 135 - 145 mmol/L 137  140  140   Potassium 3.5 - 5.1 mmol/L 4.3  4.3  4.2   Chloride 98 - 111 mmol/L 100  103  102   CO2 22 - 32 mmol/L 26  25  25    Calcium  8.9 - 10.3 mg/dL 8.5  9.0  9.3   Total Protein 6.5 - 8.1 g/dL   7.4   Total Bilirubin 0.0 - 1.2 mg/dL   0.9    Alkaline Phos 38 - 126 U/L   54   AST 15 - 41 U/L   17   ALT 0 - 44 U/L   26    Hgb A1c MFr Bld (%)  Date Value  11/20/2017 4.4 (L)   Navah Grondin N. Magda, MD [facility] Phone Number: [phone] 05/18/2024 10:45 AM  Total time spent on preparing this encounter including chart review, data review, collecting history, examining the patient, and coordinating care: 30 minutes  Portions of this report may have been transcribed using voice recognition software.  Every effort has been made to ensure accuracy; however, inadvertent computerized transcription errors may still be present.

## 2024-05-18 NOTE — Op Note (Addendum)
 DATE OF SERVICE: 05/18/2024  PATIENT:  Bryce Miller  63 y.o. male  PRE-OPERATIVE DIAGNOSIS:  end-stage renal disease; malfunctioning TDC  POST-OPERATIVE DIAGNOSIS:  Same  PROCEDURE:   exchange of tunneled dialysis catheter (CPT [id number]) conscious sedation (16 minutes) (CPT [phone]) established outpatient evaluation and management - level 3 (CPT 99213)  SURGEON:  Bryce SAILOR. Magda, MD  ASSISTANT: none  ANESTHESIA:   local and IV sedation  ESTIMATED BLOOD LOSS: min  LOCAL MEDICATIONS USED:  LIDOCAINE    COUNTS: confirmed correct.  PATIENT DISPOSITION:  PACU - hemodynamically stable.   Delay start of Pharmacological VTE agent (>24hrs) due to surgical blood loss or risk of bleeding: no  INDICATION FOR PROCEDURE: Bryce Miller is a 63 y.o. male with ESRD on HD. He dialyzes through a [facility] which is not working well. After careful discussion of risks, benefits, and alternatives the patient was offered catheter exchange. The patient understood and wished to proceed.  OPERATIVE FINDINGS:  Successful exchange of tunneled dialysis catheter  DESCRIPTION OF PROCEDURE: After identification of the patient in the pre-operative holding area, the patient was transferred to the operating room. The patient was positioned supine on the operating room table.  The right neck and chest were was prepped and draped in standard fashion. A surgical pause was performed confirming correct patient, procedure, and operative location.  A stiff angled Glidewire was advanced through the existing catheter into the right ventricle.  The catheter was freed from the subcutaneous attachments by freeing the felt cuff.  Once the catheter was freed, the catheter was removed over the wire.  A new 23 cm palindrome tunneled dialysis catheter was advanced with the assistance of stylette's over the wire into the position in the right atrium.  The stylette's and wire were removed.  The catheter was tested and found to function well.   The catheter was secured to the chest wall with 2 nylon stitches.  Dermabond was applied to the exit site.  Conscious sedation was administered with the use of IV fentanyl  and midazolam  under continuous physician and nurse monitoring.  Heart rate, blood pressure, and oxygen saturation were continuously monitored.  Total sedation time was 16 minutes  Upon completion of the case instrument and sharps counts were confirmed correct. The patient was transferred to the PACU in good condition. I was present for all portions of the procedure.  PLAN: Okay to use catheter at any point.  Follow-up as needed  Bryce SAILOR. Magda, MD [facility] Phone Number: [phone] 05/18/2024 11:20 AM

## 2024-05-19 ENCOUNTER — Encounter (HOSPITAL_COMMUNITY): Payer: Self-pay | Admitting: Vascular Surgery

## 2024-05-28 ENCOUNTER — Other Ambulatory Visit: Payer: Self-pay

## 2024-05-28 ENCOUNTER — Inpatient Hospital Stay (HOSPITAL_COMMUNITY)
Admission: EM | Admit: 2024-05-28 | Discharge: 2024-06-01 | DRG: 682 | Disposition: A | Discharge status: Home / Self Care | Source: Other Acute Inpatient Hospital | Attending: Internal Medicine | Admitting: Internal Medicine

## 2024-05-28 DIAGNOSIS — Z992 Dependence on renal dialysis: Secondary | ICD-10-CM

## 2024-05-28 DIAGNOSIS — K219 Gastro-esophageal reflux disease without esophagitis: Secondary | ICD-10-CM | POA: Diagnosis present

## 2024-05-28 DIAGNOSIS — N2581 Secondary hyperparathyroidism of renal origin: Secondary | ICD-10-CM | POA: Diagnosis present

## 2024-05-28 DIAGNOSIS — E876 Hypokalemia: Secondary | ICD-10-CM | POA: Diagnosis present

## 2024-05-28 DIAGNOSIS — R7881 Bacteremia: Principal | ICD-10-CM | POA: Diagnosis present

## 2024-05-28 DIAGNOSIS — B964 Proteus (mirabilis) (morganii) as the cause of diseases classified elsewhere: Secondary | ICD-10-CM | POA: Diagnosis present

## 2024-05-28 DIAGNOSIS — R569 Unspecified convulsions: Secondary | ICD-10-CM

## 2024-05-28 DIAGNOSIS — G8220 Paraplegia, unspecified: Secondary | ICD-10-CM | POA: Diagnosis present

## 2024-05-28 DIAGNOSIS — D631 Anemia in chronic kidney disease: Secondary | ICD-10-CM | POA: Diagnosis present

## 2024-05-28 DIAGNOSIS — F32A Depression, unspecified: Secondary | ICD-10-CM | POA: Diagnosis present

## 2024-05-28 DIAGNOSIS — Z8673 Personal history of transient ischemic attack (TIA), and cerebral infarction without residual deficits: Secondary | ICD-10-CM

## 2024-05-28 DIAGNOSIS — N186 End stage renal disease: Secondary | ICD-10-CM | POA: Diagnosis present

## 2024-05-28 DIAGNOSIS — I10 Essential (primary) hypertension: Secondary | ICD-10-CM | POA: Diagnosis present

## 2024-05-28 DIAGNOSIS — G40909 Epilepsy, unspecified, not intractable, without status epilepticus: Secondary | ICD-10-CM | POA: Diagnosis present

## 2024-05-28 DIAGNOSIS — Z79899 Other long term (current) drug therapy: Secondary | ICD-10-CM | POA: Diagnosis not present

## 2024-05-28 DIAGNOSIS — I120 Hypertensive chronic kidney disease with stage 5 chronic kidney disease or end stage renal disease: Principal | ICD-10-CM | POA: Diagnosis present

## 2024-05-28 LAB — CBC WITH DIFFERENTIAL/PLATELET
Abs Immature Granulocytes: 0.04 K/uL (ref 0.00–0.07)
Basophils Absolute: 0 K/uL (ref 0.0–0.1)
Basophils Relative: 0 %
Eosinophils Absolute: 0.1 K/uL (ref 0.0–0.5)
Eosinophils Relative: 2 %
HCT: 31.7 % — ABNORMAL LOW (ref 39.0–52.0)
Hemoglobin: 10.4 g/dL — ABNORMAL LOW (ref 13.0–17.0)
Immature Granulocytes: 1 %
Lymphocytes Relative: 17 %
Lymphs Abs: 1.2 K/uL (ref 0.7–4.0)
MCH: 29.7 pg (ref 26.0–34.0)
MCHC: 32.8 g/dL (ref 30.0–36.0)
MCV: 90.6 fL (ref 80.0–100.0)
Monocytes Absolute: 0.6 K/uL (ref 0.1–1.0)
Monocytes Relative: 8 %
Neutro Abs: 5.2 K/uL (ref 1.7–7.7)
Neutrophils Relative %: 72 %
Platelets: 238 K/uL (ref 150–400)
RBC: 3.5 MIL/uL — ABNORMAL LOW (ref 4.22–5.81)
RDW: 13.9 % (ref 11.5–15.5)
WBC: 7.2 K/uL (ref 4.0–10.5)
nRBC: 0 % (ref 0.0–0.2)

## 2024-05-28 LAB — URINALYSIS, ROUTINE W REFLEX MICROSCOPIC
Bacteria, UA: NONE SEEN
Bilirubin Urine: NEGATIVE
Glucose, UA: NEGATIVE mg/dL
Ketones, ur: NEGATIVE mg/dL
Leukocytes,Ua: NEGATIVE
Nitrite: NEGATIVE
Protein, ur: 100 mg/dL — AB
Specific Gravity, Urine: 1.008 (ref 1.005–1.030)
pH: 7 (ref 5.0–8.0)

## 2024-05-28 LAB — BASIC METABOLIC PANEL WITH GFR
Anion gap: 14 (ref 5–15)
BUN: 18 mg/dL (ref 8–23)
CO2: 29 mmol/L (ref 22–32)
Calcium: 9 mg/dL (ref 8.9–10.3)
Chloride: 95 mmol/L — ABNORMAL LOW (ref 98–111)
Creatinine, Ser: 4.88 mg/dL — ABNORMAL HIGH (ref 0.61–1.24)
GFR, Estimated: 13 mL/min — ABNORMAL LOW (ref >60)
Glucose, Bld: 87 mg/dL (ref 70–99)
Potassium: 3.3 mmol/L — ABNORMAL LOW (ref 3.5–5.1)
Sodium: 137 mmol/L (ref 135–145)

## 2024-05-28 LAB — HEPATIC FUNCTION PANEL
ALT: 17 U/L (ref 0–44)
AST: 24 U/L (ref 15–41)
Albumin: 4 g/dL (ref 3.5–5.0)
Alkaline Phosphatase: 54 U/L (ref 38–126)
Bilirubin, Direct: 0.2 mg/dL (ref 0.0–0.2)
Indirect Bilirubin: 0.3 mg/dL (ref 0.3–0.9)
Total Bilirubin: 0.5 mg/dL (ref 0.0–1.2)
Total Protein: 8.2 g/dL — ABNORMAL HIGH (ref 6.5–8.1)

## 2024-05-28 LAB — LACTIC ACID, PLASMA: Lactic Acid, Venous: 1.4 mmol/L (ref 0.5–1.9)

## 2024-05-28 MED ORDER — AMLODIPINE BESYLATE 5 MG PO TABS
5.0000 mg | ORAL_TABLET | Freq: Every day | ORAL | Status: DC
  Administered 2024-05-29 – 2024-06-01 (×3): 5 mg via ORAL
  Filled 2024-05-28 (×4): qty 1

## 2024-05-28 MED ORDER — SODIUM CHLORIDE 0.9% FLUSH
10.0000 mL | Freq: Two times a day (BID) | INTRAVENOUS | Status: DC
  Administered 2024-05-28: 20 mL
  Administered 2024-05-29 – 2024-05-31 (×5): 10 mL

## 2024-05-28 MED ORDER — FUROSEMIDE 40 MG PO TABS
40.0000 mg | ORAL_TABLET | Freq: Every day | ORAL | Status: DC
  Administered 2024-05-29 – 2024-06-01 (×4): 40 mg via ORAL
  Filled 2024-05-28 (×4): qty 1

## 2024-05-28 MED ORDER — SODIUM CHLORIDE 0.9% FLUSH: 10.0000 mL | INTRAVENOUS | Status: DC | PRN

## 2024-05-28 MED ORDER — SEVELAMER CARBONATE 800 MG PO TABS
1600.0000 mg | ORAL_TABLET | Freq: Three times a day (TID) | ORAL | Status: DC
  Administered 2024-05-29 – 2024-06-01 (×8): 1600 mg via ORAL
  Filled 2024-05-28 (×9): qty 2

## 2024-05-28 MED ORDER — ACETAMINOPHEN 650 MG RE SUPP: 650.0000 mg | Freq: Four times a day (QID) | RECTAL | Status: DC | PRN

## 2024-05-28 MED ORDER — HEPARIN SODIUM (PORCINE) 5000 UNIT/ML IJ SOLN
5000.0000 [IU] | Freq: Three times a day (TID) | INTRAMUSCULAR | Status: DC
  Administered 2024-05-28 – 2024-06-01 (×10): 5000 [IU] via SUBCUTANEOUS
  Filled 2024-05-28 (×10): qty 1

## 2024-05-28 MED ORDER — ACETAMINOPHEN 325 MG PO TABS
650.0000 mg | ORAL_TABLET | Freq: Four times a day (QID) | ORAL | Status: DC | PRN
  Administered 2024-05-31: 650 mg via ORAL
  Filled 2024-05-28: qty 2

## 2024-05-28 MED ORDER — HYDRALAZINE HCL 20 MG/ML IJ SOLN
5.0000 mg | INTRAMUSCULAR | Status: DC | PRN
  Administered 2024-05-31: 5 mg via INTRAVENOUS
  Filled 2024-05-28: qty 1

## 2024-05-28 MED ORDER — HYDRALAZINE HCL 50 MG PO TABS
50.0000 mg | ORAL_TABLET | Freq: Three times a day (TID) | ORAL | Status: DC
  Administered 2024-05-28 – 2024-06-01 (×10): 50 mg via ORAL
  Filled 2024-05-28 (×11): qty 1

## 2024-05-28 MED ORDER — CLONIDINE HCL 0.2 MG/24HR TD PTWK
0.2000 mg | MEDICATED_PATCH | TRANSDERMAL | Status: DC
  Administered 2024-05-29: 0.2 mg via TRANSDERMAL
  Filled 2024-05-28: qty 1

## 2024-05-28 MED ORDER — LEVETIRACETAM 500 MG PO TABS
500.0000 mg | ORAL_TABLET | Freq: Two times a day (BID) | ORAL | Status: DC
  Administered 2024-05-28 – 2024-06-01 (×8): 500 mg via ORAL
  Filled 2024-05-28 (×8): qty 1

## 2024-05-28 MED ORDER — FLUOXETINE HCL 10 MG PO CAPS
10.0000 mg | ORAL_CAPSULE | Freq: Every day | ORAL | Status: DC
  Administered 2024-05-29 – 2024-06-01 (×4): 10 mg via ORAL
  Filled 2024-05-28 (×4): qty 1

## 2024-05-28 MED ORDER — CHLORHEXIDINE GLUCONATE CLOTH 2 % EX PADS
6.0000 | MEDICATED_PAD | Freq: Every day | CUTANEOUS | Status: DC
  Administered 2024-05-28 – 2024-06-01 (×4): 6 via TOPICAL

## 2024-05-28 MED ORDER — LOSARTAN POTASSIUM 50 MG PO TABS
50.0000 mg | ORAL_TABLET | Freq: Every day | ORAL | Status: DC
  Administered 2024-05-29 – 2024-06-01 (×3): 50 mg via ORAL
  Filled 2024-05-28 (×4): qty 1

## 2024-05-28 MED ORDER — PANTOPRAZOLE SODIUM 40 MG PO TBEC
40.0000 mg | DELAYED_RELEASE_TABLET | Freq: Every day | ORAL | Status: DC
  Administered 2024-05-29 – 2024-06-01 (×4): 40 mg via ORAL
  Filled 2024-05-28 (×4): qty 1

## 2024-05-28 MED ORDER — METOPROLOL TARTRATE 25 MG PO TABS
25.0000 mg | ORAL_TABLET | Freq: Every day | ORAL | Status: DC
  Administered 2024-05-29 – 2024-06-01 (×3): 25 mg via ORAL
  Filled 2024-05-28 (×4): qty 1

## 2024-05-28 NOTE — H&P (Signed)
 [facility] H&P   Patient Demographics:    Bryce Miller, is a 63 y.o. male  MRN: [id number]   DOB - 1961-05-10  Admit Date - 05/28/2024  Outpatient Primary MD for the patient is Fanta, Benita Area, MD  Referring MD/NP/PA: Dr Patsey  Outpatient Specialists: renal [facility] at [facility]    Patient coming from: HD center   Chief Complaint  Patient presents with   Abnormal Labs      HPI:    Bryce Miller  is a 63 y.o. male,  with medical history significant of ESRD, dialysis dependent on TTS schedule, prior CVA with left-sided spinal paralysis, history of hypertension, seizure disorder, hypertension, GERD was sent to the ED from dialysis center for positive blood cultures, apparently blood cultures obtained 05/23/2024 as patient developed fever and chills during dialysis, blood cultures came back positive for Proteus  bacteremia, patient poor historian, but sister reports he had history of fever last Tuesday during dialysis, patient still making urine, but he denies any dysuria, patient has a history TDC catheter exchanged by vascular surgery Dr. Jeannetta on 05/18/2024. -In ED he is afebrile, labs significant for potassium 3.3, lactic acid within normal limit, white blood cell 7.2, blood cultures were obtained in ED, of note patient did not receive any IV antibiotics while in ED as he just received IV ceftazidime and vancomycin  after HD session the dialysis center, [facility] hospitalist consulted to admit     Review of systems:     A full 10 point Review of Systems was done, except as stated above, all other Review of Systems were negative.   With Past History of the following :    Past Medical History:  Diagnosis Date   Constipation    ESRD (end stage renal disease) (HCC)    [place]- TTHS   GERD (gastroesophageal reflux disease)    Hypertension    Paraplegic spinal  paralysis (HCC)    Pre-diabetes    Seizure (HCC)    Stroke (HCC)    left side weakness, memory loss      Past Surgical History:  Procedure Laterality Date   A/V FISTULAGRAM N/A 05/09/2020   Procedure: A/V FISTULAGRAM;  Surgeon: Sheree Penne Bruckner, MD;  Location: [facility] INVASIVE CV LAB;  Service: Cardiovascular;  Laterality: N/A;   A/V SHUNTOGRAM Right 03/21/2018   Procedure: A/V SHUNTOGRAM;  Surgeon: Harvey Carlin BRAVO, MD;  Location: [facility] INVASIVE CV LAB;  Service: Cardiovascular;  Laterality: Right;   AV FISTULA PLACEMENT Right 12/06/2015   Procedure: ARTERIOVENOUS (AV) FISTULA CREATION-RIGHT upper arm ;  Surgeon: Redell LITTIE Door, MD;  Location: [facility] OR;  Service: Vascular;  Laterality: Right;   AV FISTULA PLACEMENT Left 02/28/2021   Procedure: LEFT ARM ARTERIOVENOUS (AV) FISTULA CREATION;  Surgeon: Oris Krystal FALCON, MD;  Location: [facility] ORS;  Service: Vascular;  Laterality: Left;   BASCILIC VEIN TRANSPOSITION Right 08/29/2020   Procedure:  RIGHT FIRST STAGE BASCILIC VEIN TRANSPOSITION VERSUS ARTERIOVENOUS GRAFT;  Surgeon: Eliza Lonni RAMAN, MD;  Location: [facility] OR;  Service: Vascular;  Laterality: Right;   COLONOSCOPY N/A 09/30/2014   Procedure: COLONOSCOPY;  Surgeon: Lamar CHRISTELLA Hollingshead, MD;  Location: [facility] ENDO SUITE;  Service: Endoscopy;  Laterality: N/A;  2:30 AM - moved to 1:10 - Ginger to notify pt   CSF SHUNT     INSERTION OF DIALYSIS CATHETER Right 07/05/2020   Procedure: INSERTION OF TUNNEL  DIALYSIS CATHETER USING 19CM PRECISION CHRONIC CATHETER;  Surgeon: Eliza Lonni RAMAN, MD;  Location: [facility] OR;  Service: Vascular;  Laterality: Right;   IR DIALY SHUNT INTRO NEEDLE/INTRACATH INITIAL W/IMG LEFT Left 06/01/2020   IR FLUORO GUIDE CV LINE RIGHT  06/01/2020   LIGATION ARTERIOVENOUS GORTEX GRAFT Right 07/05/2020   Procedure: LIGATION EXCISION  ARTERIOVENOUS GORTEX GRAFT;  Surgeon: Eliza Lonni RAMAN, MD;  Location: [facility] OR;  Service: Vascular;  Laterality: Right;   PERIPHERAL VASCULAR BALLOON  ANGIOPLASTY Right 03/21/2018   Procedure: PERIPHERAL VASCULAR BALLOON ANGIOPLASTY;  Surgeon: Harvey Carlin BRAVO, MD;  Location: [facility] INVASIVE CV LAB;  Service: Cardiovascular;  Laterality: Right;  AV GRAFT   PERIPHERAL VASCULAR INTERVENTION Right 05/09/2020   Procedure: PERIPHERAL VASCULAR INTERVENTION;  Surgeon: Sheree Penne Lonni, MD;  Location: [facility] INVASIVE CV LAB;  Service: Cardiovascular;  Laterality: Right;   TUNNELLED CATHETER EXCHANGE N/A 05/18/2024   Procedure: TUNNELLED CATHETER EXCHANGE;  Surgeon: Magda Debby SAILOR, MD;  Location: [facility] LAB;  Service: Cardiovascular;  Laterality: N/A;      Social History:     Social History   Tobacco Use   Smoking status: Never   Smokeless tobacco: Never  Substance Use Topics   Alcohol use: No        Family History :     Family History  Problem Relation Age of Onset   Diabetes Mother       Home Medications:   Prior to Admission medications   Medication Sig Start Date End Date Taking? Authorizing Provider  amLODipine  (NORVASC ) 5 MG tablet Take 5 mg by mouth daily. 08/15/20  Yes [provider]  cloNIDine  (CATAPRES  - DOSED IN MG/24 HR) 0.2 mg/24hr patch Place 1 patch (0.2 mg total) onto the skin once a week. 02/06/24  Yes Ricky Fines, MD  FLUoxetine  (PROZAC ) 10 MG capsule Take 10 mg by mouth daily. 08/15/20  Yes [provider]  furosemide  (LASIX ) 40 MG tablet Take 40 mg by mouth daily.    Yes [provider]  hydrALAZINE  (APRESOLINE ) 50 MG tablet Take 50 mg by mouth 3 (three) times daily. 05/28/24  Yes [provider]  levETIRAcetam  (KEPPRA ) 500 MG tablet Take 500 mg by mouth 2 (two) times daily. 08/15/20  Yes [provider]  losartan  (COZAAR ) 100 MG tablet Take 50 mg by mouth daily. 11/15/23  Yes [provider]  metoprolol  tartrate (LOPRESSOR ) 25 MG tablet Take 25 mg by mouth daily. 05/23/24  Yes [provider]  omeprazole (PRILOSEC) 20 MG capsule Take 20 mg by  mouth daily. 05/23/24  Yes [provider]  sevelamer carbonate (RENVELA) 800 MG tablet Take 800-1,600 mg by mouth See admin instructions. 1600 mg with meals, 800 mg with snacks   Yes [provider]  metoprolol  succinate (TOPROL -XL) 50 MG 24 hr tablet Take 50 mg by mouth daily. 12/31/23   [provider]     Allergies:    No Known Allergies   Physical Exam:   Vitals  Blood pressure ROLLEN)  159/95, pulse 94, temperature 98.4 F (36.9 C), temperature source Oral, resp. rate 15, height 6' 2 (1.88 m), weight 90.5 kg, SpO2 96%.   1. General Well-developed male, laying in bed, no apparent distress  2.  Slow to respond, at baseline, answering simple yes/no questions  3. No F.N deficits, ALL C.Nerves Intact  4. Ears and Eyes appear Normal, Conjunctivae clear, PERRLA. Moist Oral Mucosa.  5. Supple Neck, No JVD, No cervical lymphadenopathy appriciated, No Carotid Bruits.  6. Symmetrical Chest wall movement, Good air movement bilaterally, CTAB.  7. RRR, No Gallops, Rubs or Murmurs, No Parasternal Heave.  8. Positive Bowel Sounds, Abdomen Soft, mild epigastric and right upper quadrant tenderness  9.  No Cyanosis, Normal Skin Turgor, No Skin Rash or Bruise.  10. Good muscle tone,  joints appear normal , no effusions, Normal ROM.     Data Review:    CBC Recent Labs  Lab 05/28/24 1547  WBC 7.2  HGB 10.4*  HCT 31.7*  PLT 238  MCV 90.6  MCH 29.7  MCHC 32.8  RDW 13.9  LYMPHSABS 1.2  MONOABS 0.6  EOSABS 0.1  BASOSABS 0.0   ------------------------------------------------------------------------------------------------------------------  Chemistries  Recent Labs  Lab 05/28/24 1547  NA 137  K 3.3*  CL 95*  CO2 29  GLUCOSE 87  BUN 18  CREATININE 4.88*  CALCIUM  9.0   ------------------------------------------------------------------------------------------------------------------ estimated creatinine clearance is 18.5 mL/min (A) (by C-G  formula based on SCr of 4.88 mg/dL (H)). ------------------------------------------------------------------------------------------------------------------ No results for input(s): TSH, T4TOTAL, T3FREE, THYROIDAB in the last 72 hours.  Invalid input(s): FREET3  Coagulation profile No results for input(s): INR, PROTIME in the last 168 hours. ------------------------------------------------------------------------------------------------------------------- No results for input(s): DDIMER in the last 72 hours. -------------------------------------------------------------------------------------------------------------------  Cardiac Enzymes No results for input(s): CKMB, TROPONINI, MYOGLOBIN in the last 168 hours.  Invalid input(s): CK ------------------------------------------------------------------------------------------------------------------ No results found for: BNP   ---------------------------------------------------------------------------------------------------------------  Urinalysis    Component Value Date/Time   COLORURINE STRAW (A) 02/04/2024 1808   APPEARANCEUR CLEAR 02/04/2024 1808   APPEARANCEUR Cloudy (A) 11/20/2023 1315   LABSPEC 1.008 02/04/2024 1808   PHURINE 8.0 02/04/2024 1808   GLUCOSEU 50 (A) 02/04/2024 1808   HGBUR NEGATIVE 02/04/2024 1808   BILIRUBINUR NEGATIVE 02/04/2024 1808   BILIRUBINUR Negative 11/20/2023 1315   KETONESUR NEGATIVE 02/04/2024 1808   PROTEINUR 100 (A) 02/04/2024 1808   NITRITE NEGATIVE 02/04/2024 1808   LEUKOCYTESUR NEGATIVE 02/04/2024 1808    ----------------------------------------------------------------------------------------------------------------   Imaging Results:    No results found.    Assessment & Plan:    Active Problems:   ESRD (end stage renal disease) (HCC)   Seizure (HCC)   Gastroesophageal reflux disease    Proteus bacteremia - Blood culture 10/13 from HD catheter  significant for Proteus Mirabella's bacteremia - Deceived IV ceftazidime and vancomycin  today after HD, so he should be covered for now till next HD. - Source is unclear, but he has right upper quadrant tenderness, will obtain LFTs and right upper quadrant ultrasound. - Still making urine, will send UA and urine cultures - Blood cultures ordered to be repeated in ED  ESRD - Finished his hemodialysis today   -TDC was recently replaced by Dr. Vonzell 05/18/2024 -Continue TTS schedule  - No indication for urgent dialysis, nephrology can be consulted in a.m.   Hypokalemia -Would not replace as he is on dialysis   GERD - Continue PPI  Hypertension -Continue with home medications, will add as needed hydralazine    prior history of stroke -  No acute focal neurologic deficit appreciated -Consult PT   history of seizure - No seizure activity appreciated - Continue Keppra   anemia of chronic disease - Hemoglobin appears stable - procrit and  iron per renal    DVT Prophylaxis Heparin   AM Labs Ordered, also please review Full Orders  Family Communication: Admission, patients condition and plan of care including tests being ordered have been discussed with the patient and sister by phone  who indicate understanding and agree with the plan and Code Status.  Code Status full code  Likely DC to home  Consults called: None  Admission status: Inpatient  Time spent in minutes : 70 minutes   Brayton Lye M.D on 05/28/2024 at 6:18 PM   [facility] - Office  [phone]

## 2024-05-28 NOTE — ED Provider Notes (Signed)
 [facility] AT [facility] Provider Note   CSN: [id number] Arrival date & time: 05/28/24  1456     Patient presents with: Abnormal Labs   Bryce Miller is a 63 y.o. male.  He is brought in by ambulance from dialysis.  He is not sure why he is here.  From the paperwork it looks like he had positive blood cultures with gram-negative rods Proteus done on the 18th.  At dialysis today he received 2 g of ceftazidime and 1 g of vancomycin .  He was sent here for further evaluation.  He denies any complaints.  No fevers chills nausea vomiting chest pain shortness of breath.  He cannot tell me how long he has had a catheter and but he said it has been a long time.  It looks like they put a tunneled catheter in October 13.  He is not sure where they did recent blood cultures.  {Add pertinent medical, surgical, social history, OB history to YEP:67052} The history is provided by the patient.       Prior to Admission medications   Medication Sig Start Date End Date Taking? Authorizing Provider  amLODipine  (NORVASC ) 5 MG tablet Take 5 mg by mouth daily. 08/15/20   [provider]  cloNIDine  (CATAPRES  - DOSED IN MG/24 HR) 0.2 mg/24hr patch Place 1 patch (0.2 mg total) onto the skin once a week. 02/06/24   Ricky Fines, MD  FLUoxetine  (PROZAC ) 10 MG capsule Take 10 mg by mouth daily. 08/15/20   [provider]  furosemide  (LASIX ) 40 MG tablet Take 40 mg by mouth daily.     [provider]  hydrALAZINE  (APRESOLINE ) 25 MG tablet Take 25 mg by mouth 3 (three) times daily. 05/23/24   [provider]  hydrALAZINE  (APRESOLINE ) 50 MG tablet Take 50 mg by mouth 3 (three) times daily. 05/28/24   [provider]  levETIRAcetam  (KEPPRA ) 500 MG tablet Take 500 mg by mouth 2 (two) times daily. 08/15/20   [provider]  losartan  (COZAAR ) 100 MG tablet Take 50 mg by mouth daily. 11/15/23   [provider]  metoprolol  succinate  (TOPROL -XL) 50 MG 24 hr tablet Take 50 mg by mouth daily. 12/31/23   [provider]  metoprolol  tartrate (LOPRESSOR ) 25 MG tablet Take 25 mg by mouth daily. 05/23/24   [provider]  omeprazole (PRILOSEC) 20 MG capsule Take 20 mg by mouth daily. 05/23/24   [provider]  pantoprazole  (PROTONIX ) 40 MG tablet Take 1 tablet (40 mg total) by mouth daily. 02/07/24   Ricky Fines, MD  prochlorperazine  (COMPAZINE ) 10 MG tablet Take 1 tablet (10 mg total) by mouth every 6 (six) hours as needed for nausea or vomiting. 02/06/24   Ricky Fines, MD  sevelamer carbonate (RENVELA) 800 MG tablet Take 800-1,600 mg by mouth See admin instructions. 1600 mg with meals, 800 mg with snacks    [provider]    Allergies: Patient has no known allergies.    Review of Systems  Constitutional:  Negative for fever.  HENT:  Negative for sore throat.   Respiratory:  Negative for shortness of breath.   Cardiovascular:  Negative for chest pain.  Gastrointestinal:  Negative for abdominal pain.  Skin:  Negative for rash.    Updated Vital Signs BP (!) 159/95   Pulse 94   Temp 98.4 F (36.9 C) (Oral)   Resp 15   Ht 6' 2 (1.88 m)   Wt 90.5 kg  SpO2 96%   BMI 25.63 kg/m   Physical Exam Vitals and nursing note reviewed.  Constitutional:      General: He is not in acute distress.    Appearance: Normal appearance. He is well-developed.  HENT:     Head: Normocephalic and atraumatic.  Eyes:     Conjunctiva/sclera: Conjunctivae normal.  Cardiovascular:     Rate and Rhythm: Normal rate and regular rhythm.     Heart sounds: No murmur heard. Pulmonary:     Effort: Pulmonary effort is normal. No respiratory distress.     Breath sounds: Normal breath sounds.     Comments: He has a vascular catheter in his right upper chest.  Area is mildly tender.  No obvious crepitus or erythema Abdominal:     Palpations: Abdomen is soft.     Tenderness: There is no abdominal tenderness.   Musculoskeletal:        General: No deformity.     Cervical back: Neck supple.  Skin:    General: Skin is warm and dry.     Capillary Refill: Capillary refill takes less than 2 seconds.  Neurological:     General: No focal deficit present.     Mental Status: He is alert.     (all labs ordered are listed, but only abnormal results are displayed) Labs Reviewed  CULTURE, BLOOD (ROUTINE X 2)  CULTURE, BLOOD (ROUTINE X 2)  BASIC METABOLIC PANEL WITH GFR  CBC WITH DIFFERENTIAL/PLATELET  LACTIC ACID, PLASMA  LACTIC ACID, PLASMA    EKG: None  Radiology: No results found.  {Document cardiac monitor, telemetry assessment procedure when appropriate:32947} Procedures   Medications Ordered in the ED - No data to display  Clinical Course as of 05/28/24 1628  Thu May 28, 2024  1627 Discussed with nephrology.  They did not know about the patient being transported.  I said he may need admission for an endocarditis workup. [MB]    Clinical Course User Index [MB] Towana Ozell BROCKS, MD   {Click here for ABCD2, HEART and other calculators REFRESH Note before signing:1}                              Medical Decision Making Amount and/or Complexity of Data Reviewed Labs: ordered.   This patient complains of ***; this involves an extensive number of treatment Options and is a complaint that carries with it a high risk of complications and morbidity. The differential includes ***  I ordered, reviewed and interpreted labs, which included *** I ordered medication *** and reviewed PMP when indicated. I ordered imaging studies which included *** and I independently    visualized and interpreted imaging which showed *** Additional history obtained from *** Previous records obtained and reviewed *** I consulted *** and discussed lab and imaging findings and discussed disposition.  Cardiac monitoring reviewed, *** Social determinants considered, *** Critical Interventions: ***  After  the interventions stated above, I reevaluated the patient and found *** Admission and further testing considered, ***   {Document critical care time when appropriate  Document review of labs and clinical decision tools ie CHADS2VASC2, etc  Document your independent review of radiology images and any outside records  Document your discussion with family members, caretakers and with consultants  Document social determinants of health affecting pt's care  Document your decision making why or why not admission, treatments were needed:32947:::1}   Final diagnoses:  None    ED  Discharge Orders     None

## 2024-05-28 NOTE — ED Triage Notes (Signed)
 Pt brought to ED via [facility] from dialysis. Blood cultures at dialysis abnormal and sent for eval

## 2024-05-29 ENCOUNTER — Inpatient Hospital Stay (HOSPITAL_COMMUNITY)

## 2024-05-29 ENCOUNTER — Encounter (HOSPITAL_COMMUNITY): Payer: Self-pay | Admitting: Internal Medicine

## 2024-05-29 DIAGNOSIS — N186 End stage renal disease: Secondary | ICD-10-CM | POA: Diagnosis not present

## 2024-05-29 LAB — CBC
HCT: 31.4 % — ABNORMAL LOW (ref 39.0–52.0)
Hemoglobin: 10.2 g/dL — ABNORMAL LOW (ref 13.0–17.0)
MCH: 30 pg (ref 26.0–34.0)
MCHC: 32.5 g/dL (ref 30.0–36.0)
MCV: 92.4 fL (ref 80.0–100.0)
Platelets: 222 K/uL (ref 150–400)
RBC: 3.4 MIL/uL — ABNORMAL LOW (ref 4.22–5.81)
RDW: 13.7 % (ref 11.5–15.5)
WBC: 7.8 K/uL (ref 4.0–10.5)
nRBC: 0 % (ref 0.0–0.2)

## 2024-05-29 LAB — BASIC METABOLIC PANEL WITH GFR
Anion gap: 14 (ref 5–15)
BUN: 26 mg/dL — ABNORMAL HIGH (ref 8–23)
CO2: 28 mmol/L (ref 22–32)
Calcium: 9.1 mg/dL (ref 8.9–10.3)
Chloride: 95 mmol/L — ABNORMAL LOW (ref 98–111)
Creatinine, Ser: 6.85 mg/dL — ABNORMAL HIGH (ref 0.61–1.24)
GFR, Estimated: 9 mL/min — ABNORMAL LOW (ref >60)
Glucose, Bld: 86 mg/dL (ref 70–99)
Potassium: 3.7 mmol/L (ref 3.5–5.1)
Sodium: 136 mmol/L (ref 135–145)

## 2024-05-29 LAB — HEPATITIS B SURFACE ANTIGEN: Hepatitis B Surface Ag: NONREACTIVE

## 2024-05-29 MED ORDER — SODIUM CHLORIDE 0.9 % IV SOLN
2.0000 g | INTRAVENOUS | Status: DC
  Administered 2024-05-30: 2 g via INTRAVENOUS
  Filled 2024-05-29: qty 12.5

## 2024-05-29 MED ORDER — CHLORHEXIDINE GLUCONATE CLOTH 2 % EX PADS
6.0000 | MEDICATED_PAD | Freq: Every day | CUTANEOUS | Status: DC
  Administered 2024-05-29 – 2024-06-01 (×4): 6 via TOPICAL

## 2024-05-29 NOTE — Progress Notes (Addendum)
 PROGRESS NOTE    Bryce Miller  FMW:[id number] DOB: 26-Aug-1960 DOA: 05/28/2024 PCP: Carlette Benita Area, MD   Brief Narrative:    63 y.o. male,  with medical history significant of ESRD, dialysis dependent on TTS schedule, prior CVA with left-sided spinal paralysis, history of hypertension, seizure disorder, hypertension, GERD was sent to the ED from dialysis center for positive blood cultures, apparently blood cultures obtained 05/23/2024 as patient developed fever and chills during dialysis, blood cultures came back positive for Proteus bacteremia. TDC catheter exchanged by vascular surgery Dr. Jeannetta on 05/18/2024. Dr Melia nephrologist informed.  Assessment & Plan:  Active Problems:   ESRD (end stage renal disease) (HCC)   Seizure (HCC)   Gastroesophageal reflux disease   Proteus bacteremia,POA: - Blood culture 10/13 from HD catheter significant for Proteus Mirabilis bacteremia - Received IV ceftazidime and vancomycin  after HD on 10/23 -repeat blood cultures drawn on 10/24 - Source is unclear, but he has right upper quadrant tenderness, will obtain LFTs and right upper quadrant ultrasound. - no evidence of UTI. -Ordered IV cefepime  as per pharmacy   ESRD, TTS -Received HD on 10/23 [facility] was recently replaced by Dr. Vonzell 05/18/2024 -Continue TTS schedule  - No indication for urgent dialysis -nephrologist, Dr Melia made aware.  Hypokalemia -prn repletion   GERD - Continue PPI   Hypertension -Continue with home medications, and as needed hydralazine    Prior history of stroke - No acute focal neurologic deficit appreciated -Consulted PT   history of seizure - No seizure activity appreciated - Continue Keppra    Anemia of chronic disease - Hemoglobin appears stable - procrit and  iron per renal  Disposition: Home. Lives with his sister and walks with the help of a walker.   DVT prophylaxis: heparin  injection 5,000 Units Start: 05/28/24 2200     Code Status:  Full Code Family Communication: None at the bedside   Status is: Inpatient Remains inpatient appropriate because: bacteremia    Subjective:  No acute events overnight. Denies any active complaints.  Examination:  General exam: Appears calm and comfortable  Respiratory system: Clear to auscultation. Respiratory effort normal. Cardiovascular system: S1 & S2 heard, RRR. No JVD, murmurs, rubs, gallops or clicks. No pedal edema. Gastrointestinal system: Abdomen is nondistended, soft and nontender. No organomegaly or masses felt. Normal bowel sounds heard. Central nervous system: Alert and oriented. No focal neurological deficits. Extremities: Symmetric 5 x 5 power. Skin: No rashes, lesions or ulcers      Diet Orders (From admission, onward)     Start     Ordered   05/28/24 2026  Diet renal with fluid restriction Fluid restriction: 1200 mL Fluid; Room service appropriate? Yes; Fluid consistency: Thin  Diet effective now       Question Answer Comment  Fluid restriction: 1200 mL Fluid   Room service appropriate? Yes   Fluid consistency: Thin      05/28/24 2025            Objective: Vitals:   05/28/24 1550 05/28/24 1946 05/29/24 0523 05/29/24 0729  BP: (!) 159/95 (!) 150/115 (!) 169/134 (!) 146/101  Pulse: 94 90 96 90  Resp: 15 18 19    Temp: 98.4 F (36.9 C) 98.9 F (37.2 C) 97.6 F (36.4 C) 98.6 F (37 C)  TempSrc: Oral   Oral  SpO2: 96% 97% 91% 96%  Weight:      Height:        Intake/Output Summary (Last 24 hours) at 05/29/2024 9060 Last data  filed at 05/29/2024 0547 Gross per 24 hour  Intake 240 ml  Output 45 ml  Net 195 ml   Filed Weights   05/28/24 1530  Weight: 90.5 kg    Scheduled Meds:  amLODipine   5 mg Oral Daily   Chlorhexidine  Gluconate Cloth  6 each Topical Daily   cloNIDine   0.2 mg Transdermal Weekly   FLUoxetine   10 mg Oral Daily   furosemide   40 mg Oral Daily   heparin   5,000 Units Subcutaneous Q8H   hydrALAZINE   50 mg Oral TID    levETIRAcetam   500 mg Oral BID   losartan   50 mg Oral Daily   metoprolol  tartrate  25 mg Oral Daily   pantoprazole   40 mg Oral Daily   sevelamer carbonate  1,600 mg Oral TID WC   sodium chloride  flush  10-40 mL Intracatheter Q12H   Continuous Infusions:  Nutritional status     Body mass index is 25.63 kg/m.  Data Reviewed:   CBC: Recent Labs  Lab 05/28/24 1547 05/29/24 0448  WBC 7.2 7.8  NEUTROABS 5.2  --   HGB 10.4* 10.2*  HCT 31.7* 31.4*  MCV 90.6 92.4  PLT 238 222   Basic Metabolic Panel: Recent Labs  Lab 05/28/24 1547 05/29/24 0448  NA 137 136  K 3.3* 3.7  CL 95* 95*  CO2 29 28  GLUCOSE 87 86  BUN 18 26*  CREATININE 4.88* 6.85*  CALCIUM  9.0 9.1   GFR: Estimated Creatinine Clearance: 13.2 mL/min (A) (by C-G formula based on SCr of 6.85 mg/dL (H)). Liver Function Tests: Recent Labs  Lab 05/28/24 1547  AST 24  ALT 17  ALKPHOS 54  BILITOT 0.5  PROT 8.2*  ALBUMIN 4.0   No results for input(s): LIPASE, AMYLASE in the last 168 hours. No results for input(s): AMMONIA in the last 168 hours. Coagulation Profile: No results for input(s): INR, PROTIME in the last 168 hours. Cardiac Enzymes: No results for input(s): CKTOTAL, CKMB, CKMBINDEX, TROPONINI in the last 168 hours. BNP (last 3 results) No results for input(s): PROBNP in the last 8760 hours. HbA1C: No results for input(s): HGBA1C in the last 72 hours. CBG: No results for input(s): GLUCAP in the last 168 hours. Lipid Profile: No results for input(s): CHOL, HDL, LDLCALC, TRIG, CHOLHDL, LDLDIRECT in the last 72 hours. Thyroid Function Tests: No results for input(s): TSH, T4TOTAL, FREET4, T3FREE, THYROIDAB in the last 72 hours. Anemia Panel: No results for input(s): VITAMINB12, FOLATE, FERRITIN, TIBC, IRON, RETICCTPCT in the last 72 hours. Sepsis Labs: Recent Labs  Lab 05/28/24 1547  LATICACIDVEN 1.4    Recent Results (from the past  240 hours)  Culture, blood (routine x 2)     Status: None (Preliminary result)   Collection Time: 05/28/24  3:45 PM   Specimen: BLOOD  Result Value Ref Range Status   Specimen Description BLOOD LEFT WRIST  Final   Special Requests   Final    BOTTLES DRAWN AEROBIC AND ANAEROBIC Blood Culture adequate volume   Culture   Final    NO GROWTH < 24 HOURS Performed at [facility], [address] [place], [place] [address]    Report Status PENDING  Incomplete  Culture, blood (routine x 2)     Status: None (Preliminary result)   Collection Time: 05/28/24  4:39 PM   Specimen: BLOOD LEFT HAND  Result Value Ref Range Status   Specimen Description BLOOD LEFT HAND  Final   Special Requests   Final  AEROBIC BOTTLE ONLY Blood Culture results may not be optimal due to an inadequate volume of blood received in culture bottles   Culture   Final    NO GROWTH < 24 HOURS Performed at [facility], [address], [place], [place] [address]    Report Status PENDING  Incomplete         Radiology Studies: No results found.         LOS: 1 day   Time spent= 37 mins    Deliliah Room, MD [facility]  If 7PM-7AM, please contact night-coverage  05/29/2024, 9:39 AM

## 2024-05-29 NOTE — TOC Initial Note (Signed)
 Transition of Care [facility]) - Initial/Assessment Note    Patient Details  Name: Bryce Miller MRN: [id number] Date of Birth: 17-Nov-1960  Transition of [facility]) CM/SW Contact:    Noreen KATHEE Cleotilde ISRAEL Phone Number: 05/29/2024, 12:13 PM  Clinical Narrative:                  CSW spoke with patient sister about PT recommendation SNF. Sister stated that they both are in the home together and family assist with patient ADL's. Sister stated that patient has a hospital bed, walker, and WC at home. Also stated that patient Medicare has been approved, just waiting for it to kick in, for more in-home services. Sister declined SNF about informing her about PT encounter. CSW will continue to follow.    Expected Discharge Plan: Home/Self Care Barriers to Discharge: Continued Medical Work up   Patient Goals and CMS Choice Patient states their goals for this hospitalization and ongoing recovery are:: return back home CMS Medicare.gov Compare Post Acute Care list provided to:: Patient Represenative (must comment) Marletta - sister) Choice offered to / list presented to : Sibling      Expected Discharge Plan and Services In-house Referral: Clinical Social Work   Post Acute Care Choice: Durable Medical Equipment Living arrangements for the past 2 months: Single Family Home                                      Prior Living Arrangements/Services Living arrangements for the past 2 months: Single Family Home Lives with:: Siblings Patient language and need for interpreter reviewed:: Yes Do you feel safe going back to the place where you live?: Yes      Need for Family Participation in Patient Care: Yes (Comment) Care giver support system in place?: Yes (comment) Current home services: DME Criminal Activity/Legal Involvement Pertinent to Current Situation/Hospitalization: No - Comment as needed  Activities of Daily Living   ADL Screening (condition at time of admission) Independently  performs ADLs?: Yes (appropriate for developmental age) Is the patient deaf or have difficulty hearing?: No Does the patient have difficulty seeing, even when wearing glasses/contacts?: No Does the patient have difficulty concentrating, remembering, or making decisions?: No  Permission Sought/Granted      Share Information with NAME: Elveria     Permission granted to share info w Relationship: sister     Emotional Assessment Appearance:: Appears stated age Attitude/Demeanor/Rapport: Unable to Assess Affect (typically observed): Unable to Assess Orientation: : Oriented to Self, Oriented to Place Alcohol / Substance Use: Not Applicable Psych Involvement: No (comment)  Admission diagnosis:  Bacteremia [R78.81] Patient Active Problem List   Diagnosis Date Noted   Bacteremia 05/28/2024   Acute pulmonary edema (HCC) 02/06/2024   Seizure (HCC) 02/06/2024   Hypertensive urgency 02/06/2024   Cerebrovascular accident (HCC) 02/06/2024   Gastroesophageal reflux disease 02/06/2024   Complication associated with dialysis catheter 02/05/2024   Aspiration pneumonia (HCC) 02/04/2024   ESRD (end stage renal disease) (HCC) 11/19/2017   Fever 11/19/2017   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   Sepsis (HCC) 11/19/2017   Chronic kidney disease (CKD), stage IV (severe) (HCC) 01/19/2016   Colon cancer screening    PCP:  Carlette Benita Area, MD Pharmacy:   [facility] [phone] - [place], [place] - [address] AT [facility] [address] [place] [address] Phone: [phone] Fax: [phone]     Social Drivers  of Health (SDOH) Social History: SDOH Screenings   Food Insecurity: No Food Insecurity (05/28/2024)  Housing: Low Risk  (05/28/2024)  Transportation Needs: No Transportation Needs (05/28/2024)  Utilities: Not At Risk (05/28/2024)  Tobacco Use: Low Risk  (02/04/2024)   SDOH Interventions:     Readmission Risk Interventions    05/29/2024   12:11 PM  02/05/2024   11:22 AM  Readmission Risk Prevention Plan  Medication Screening  Complete  Transportation Screening Complete Complete  Home Care Screening Complete   Medication Review (RN CM) Complete

## 2024-05-29 NOTE — Plan of Care (Signed)
   Problem: Education: Goal: Knowledge of General Education information will improve Description: Including pain rating scale, medication(s)/side effects and non-pharmacologic comfort measures Outcome: Progressing   Problem: Pain Managment: Goal: General experience of comfort will improve and/or be controlled Outcome: Progressing   Problem: Safety: Goal: Ability to remain free from injury will improve Outcome: Progressing

## 2024-05-29 NOTE — Plan of Care (Signed)
  Problem: Acute Rehab PT Goals(only PT should resolve) Goal: Pt Will Go Supine/Side To Sit Outcome: Progressing Flowsheets (Taken 05/29/2024 1422) Pt will go Supine/Side to Sit:  with minimal assist  with moderate assist Goal: Patient Will Transfer Sit To/From Stand Outcome: Progressing Flowsheets (Taken 05/29/2024 1422) Patient will transfer sit to/from stand:  with minimal assist  with moderate assist Goal: Pt Will Transfer Bed To Chair/Chair To Bed Outcome: Progressing Flowsheets (Taken 05/29/2024 1422) Pt will Transfer Bed to Chair/Chair to Bed:  with min assist  with mod assist Goal: Pt Will Ambulate Outcome: Progressing Flowsheets (Taken 05/29/2024 1422) Pt will Ambulate:  10 feet  with moderate assist  with rolling walker   2:23 PM, 05/29/24 Lynwood Music, MPT Physical Therapist with [facility] [phone] office [phone] mobile phone

## 2024-05-29 NOTE — Progress Notes (Signed)
 Mobility Specialist Progress Note:    05/29/24 1220  Mobility  Activity Pivoted/transferred from chair to bed  Level of Assistance Moderate assist, patient does 50-74% (+2)  Assistive Device None  Distance Ambulated (ft) 3 ft  Range of Motion/Exercises Active;All extremities  Activity Response Tolerated well  Mobility Referral Yes  Mobility visit 1 Mobility  Mobility Specialist Start Time (ACUTE ONLY) 1220  Mobility Specialist Stop Time (ACUTE ONLY) 1240  Mobility Specialist Time Calculation (min) (ACUTE ONLY) 20 min   Pt received in chair, sonographer requesting assistance transferring to chair. Required ModA +2 to stand and transfer with no AD. Tolerated well, LUE and BLE weakness. Sonographer in room, all needs met.  Areej Tayler Mobility Specialist Please contact via Special educational needs teacher or  Rehab office at [phone]

## 2024-05-29 NOTE — Plan of Care (Signed)
  Problem: Education: Goal: Knowledge of General Education information will improve Description: Including pain rating scale, medication(s)/side effects and non-pharmacologic comfort measures Outcome: Not Met (add Reason)   Problem: Health Behavior/Discharge Planning: Goal: Ability to manage health-related needs will improve Outcome: Not Met (add Reason)   Problem: Clinical Measurements: Goal: Ability to maintain clinical measurements within normal limits will improve Outcome: Progressing   Problem: Activity: Goal: Risk for activity intolerance will decrease Outcome: Progressing   Problem: Nutrition: Goal: Adequate nutrition will be maintained Outcome: Progressing

## 2024-05-29 NOTE — Progress Notes (Signed)
 Upon assessment, pt's HD Catheter open to air with no dressing; remains intact. New dressing placed and labeled.

## 2024-05-29 NOTE — Evaluation (Signed)
 Physical Therapy Evaluation Patient Details Name: Bryce Miller MRN: [id number] DOB: Aug 28, 1960 Today's Date: 05/29/2024  History of Present Illness  Bryce Miller  is a 63 y.o. male,  with medical history significant of ESRD, dialysis dependent on TTS schedule, prior CVA with left-sided spinal paralysis, history of hypertension, seizure disorder, hypertension, GERD was sent to the ED from dialysis center for positive blood cultures, apparently blood cultures obtained 05/23/2024 as patient developed fever and chills during dialysis, blood cultures came back positive for Proteus  bacteremia, patient poor historian, but sister reports he had history of fever last Tuesday during dialysis, patient still making urine, but he denies any dysuria, patient has a history TDC catheter exchanged by vascular surgery Dr. Jeannetta on 05/18/2024.  -In ED he is afebrile, labs significant for potassium 3.3, lactic acid within normal limit, white blood cell 7.2, blood cultures were obtained in ED, of note patient did not receive any IV antibiotics while in ED as he just received IV ceftazidime and vancomycin  after HD session the dialysis center, [facility] consulted to admit   Clinical Impression  Patient demonstrates slow labored movement for sitting up at bedside with limited use left side due paralysis from old CVA, very unsteady on feet with frequent posterior leaning requiring Mod/max assist for maintaining standing balance and limited to a few side steps before having to sit due to loss of balance. Patient tolerated sitting up in chair after therapy. Patient will benefit from continued skilled physical therapy in hospital and recommended venue below to increase strength, balance, endurance for safe ADLs and gait.           If plan is discharge home, recommend the following: A lot of help with bathing/dressing/bathroom;A lot of help with walking and/or transfers;Help with stairs or ramp for  entrance;Assistance with cooking/housework;Assist for transportation   Can travel by private vehicle   No    Equipment Recommendations None recommended by PT  Recommendations for Other Services       Functional Status Assessment Patient has had a recent decline in their functional status and demonstrates the ability to make significant improvements in function in a reasonable and predictable amount of time.     Precautions / Restrictions Precautions Precautions: Fall Recall of Precautions/Restrictions: Intact Restrictions Weight Bearing Restrictions Per Provider Order: No      Mobility  Bed Mobility Overal bed mobility: Needs Assistance Bed Mobility: Supine to Sit     Supine to sit: Mod assist, Max assist     General bed mobility comments: (S) increased time, labored movement, limited use of LUE due to baseline paralysis    Transfers Overall transfer level: Needs assistance Equipment used: Rolling walker (2 wheels) Transfers: Sit to/from Stand, Bed to chair/wheelchair/BSC Sit to Stand: Mod assist, Max assist   Step pivot transfers: Max assist       General transfer comment: unsteady labored movement with frequent posterior leaning    Ambulation/Gait Ambulation/Gait assistance: Max assist Gait Distance (Feet): 4 Feet Assistive device: Rolling walker (2 wheels) Gait Pattern/deviations: Decreased step length - left, Decreased stance time - right, Decreased stride length, Decreased stance time - left, Scissoring, Knees buckling Gait velocity: slow     General Gait Details: limited to a few slow labored usnteady side steps before having to sit due to loss of balance  Stairs            Wheelchair Mobility     Tilt Bed    Modified Rankin (Stroke Patients Only)  Balance Overall balance assessment: Needs assistance Sitting-balance support: Feet supported, No upper extremity supported Sitting balance-Leahy Scale: Fair Sitting balance -  Comments: seated at EOB   Standing balance support: Reliant on assistive device for balance, During functional activity, Bilateral upper extremity supported Standing balance-Leahy Scale: Poor Standing balance comment: using RW                             Pertinent Vitals/Pain Pain Assessment Pain Assessment: No/denies pain    Home Living Family/patient expects to be discharged to:: Private residence Living Arrangements: Spouse/significant other;Other relatives Available Help at Discharge: Family;Available 24 hours/day Type of Home: House Home Access: Ramped entrance       Home Layout: One level Home Equipment: Agricultural consultant (2 wheels);Wheelchair - manual;Shower seat;Grab bars - tub/shower;BSC/3in1      Prior Function Prior Level of Function : Needs assist       Physical Assist : Mobility (physical);ADLs (physical) Mobility (physical): Bed mobility;Transfers;Gait;Stairs   Mobility Comments: short household distances using RW ADLs Comments: has an aide that comes in dailty, assisted by family     Extremity/Trunk Assessment   Upper Extremity Assessment Upper Extremity Assessment: Defer to OT evaluation    Lower Extremity Assessment Lower Extremity Assessment: Generalized weakness;LLE deficits/detail LLE Deficits / Details: grossly 3+5 LLE Sensation: decreased proprioception LLE Coordination: decreased gross motor    Cervical / Trunk Assessment Cervical / Trunk Assessment: Kyphotic  Communication   Communication Communication: No apparent difficulties    Cognition Arousal: Alert Behavior During Therapy: WFL for tasks assessed/performed   PT - Cognitive impairments: No apparent impairments                         Following commands: Intact       Cueing Cueing Techniques: Verbal cues, Tactile cues     General Comments      Exercises     Assessment/Plan    PT Assessment Patient needs continued PT services  PT Problem List  Decreased strength;Decreased activity tolerance;Decreased balance;Decreased mobility       PT Treatment Interventions DME instruction;Gait training;Stair training;Functional mobility training;Therapeutic activities;Therapeutic exercise;Balance training;Patient/family education    PT Goals (Current goals can be found in the Care Plan section)  Acute Rehab PT Goals Patient Stated Goal: return home with family, home aides to assist PT Goal Formulation: With patient Time For Goal Achievement: 06/12/24 Potential to Achieve Goals: Good    Frequency Min 3X/week     Co-evaluation               AM-PAC PT 6 Clicks Mobility  Outcome Measure Help needed turning from your back to your side while in a flat bed without using bedrails?: A Lot Help needed moving from lying on your back to sitting on the side of a flat bed without using bedrails?: A Lot Help needed moving to and from a bed to a chair (including a wheelchair)?: A Lot Help needed standing up from a chair using your arms (e.g., wheelchair or bedside chair)?: A Lot Help needed to walk in hospital room?: A Lot Help needed climbing 3-5 steps with a railing? : Total 6 Click Score: 11    End of Session   Activity Tolerance: Patient tolerated treatment well;Patient limited by fatigue Patient left: in chair;with call bell/phone within reach Nurse Communication: Mobility status PT Visit Diagnosis: Unsteadiness on feet (R26.81);Other abnormalities of gait and mobility (R26.89);Muscle weakness (generalized) (  M62.81)    Time: 8868-8846 PT Time Calculation (min) (ACUTE ONLY): 22 min   Charges:   PT Evaluation $PT Eval Moderate Complexity: 1 Mod PT Treatments $Therapeutic Activity: 8-22 mins PT General Charges $$ ACUTE PT VISIT: 1 Visit         2:21 PM, 05/29/24 Lynwood Music, MPT Physical Therapist with [facility] [phone] office [phone] mobile phone

## 2024-05-29 NOTE — Consult Note (Addendum)
 Reason for Consult: ESRD Referring Physician:  Dr. Dino  Chief Complaint: abnormal labs  Dialysis Orders: Center:  [facility]  on TTS . EDW 87.5 kg HD Bath 2K/2.5Ca  Time 4:00 Heparin  1000 units bolus then 1200 units/hr. Access RIJ TDC BFR 400 DFR 500    Zemplar 3 mcg po with HD three times per week (was not showing up on MAR) Venofer  50 mg IV once a week Micera 30 mg IV q2 weeks (last given on 10/21) Sensipar 30 mg three times per week  Assessment/Plan: Bacteremia positive for Proteus cultured at [facility] on 05/23/2024 symptomatic with fever and chills, received ceftazidime and vancomycin  at dialysis treatment Thursday, 05/28/2024 and currently on cefepime .  Cultures resent 10/23 are negative to date  ESRD -   TTS regimen; occasions for dialysis and will plan on dialyzing tomorrow.  Of note he sometimes drops his pressures but has been able to achieve his listed EDW.   Will not be physically seeing the patient this weekend unless there are unforeseen changes and will monitor remotely. Dr. Tobie will be covering [facility] this weekend.    Hypertension/volume  - UF with HD, restart home regimen and follow. Still a little hypertensive this morning.  Anemia  - Hgb stable (10.2)  Metabolic bone disease -   continue with home meds (on sevelamer) and will check a phos for binder management.  Nutrition -  renal diet  Vascular access - changed out by  Dr. Magda on 05/18/24.    HPI: Bryce Miller is an 63 y.o. male with history of prior spinal cord CVA w/ paraplegia, hypertension, seizure disorder, GERD, referred for + proteus bacteremia on 05/24/2019 symptomatic with fever and chills.  Patient had a recent [facility] catheter exchange by Dr. Emi on 05/18/24.  Patient did receive ceftazidime and vancomycin  after dialysis. He has been having subjective fever, chills for a few weeks now, has not noticed any discharge from the tunnel exit site but has some tenderness over the tunnel tract.   Patient denies any myalgias, is still making urine but denies any dysuria.  Patient also denies any cough or diarrhea. Patient received ceftazidime and vancomycin  at dialysis treatment Thursday, 05/28/2024 and currently on cefepime .   In the ED patient 3.3 White Count of 7.2, overnight did not have any fevers. HCO3 28 BUN/ Cr 26/6.85, Hb 10.2.   ROS Pertinent items are noted in HPI.  Chemistry and CBC: Creatinine, Ser  Date/Time Value Ref Range Status  05/29/2024 04:48 AM 6.85 (H) 0.61 - 1.24 mg/dL Final  89/76/7974 96:52 PM 4.88 (H) 0.61 - 1.24 mg/dL Final  92/96/7974 96:77 PM 10.19 (H) 0.61 - 1.24 mg/dL Final  92/97/7974 95:61 AM 13.37 (H) 0.61 - 1.24 mg/dL Final  92/98/7974 89:70 AM 11.56 (H) 0.61 - 1.24 mg/dL Final  89/77/7975 89:45 AM 12.35 (H) 0.61 - 1.24 mg/dL Final  94/71/7975 87:67 PM 12.21 (H) 0.61 - 1.24 mg/dL Final  89/83/7977 95:97 PM 8.83 (H) 0.61 - 1.24 mg/dL Final  92/73/7977 90:79 AM 8.40 (H) 0.61 - 1.24 mg/dL Final  98/75/7977 93:56 AM 13.00 (H) 0.61 - 1.24 mg/dL Final  88/69/7978 98:99 PM 14.78 (H) 0.61 - 1.24 mg/dL Final  89/72/7978 98:68 PM 17.40 (H) 0.61 - 1.24 mg/dL Final  89/95/7978 87:94 PM 11.50 (H) 0.61 - 1.24 mg/dL Final  89/80/7980 92:83 PM 5.90 (H) 0.61 - 1.24 mg/dL Final  91/83/7980 91:51 AM 7.90 (H) 0.61 - 1.24 mg/dL Final  95/82/7980 95:44 AM 5.66 (H) 0.61 - 1.24  mg/dL Final  95/83/7980 96:73 PM 4.42 (H) 0.61 - 1.24 mg/dL Final   Recent Labs  Lab 05/28/24 1547 05/29/24 0448  NA 137 136  K 3.3* 3.7  CL 95* 95*  CO2 29 28  GLUCOSE 87 86  BUN 18 26*  CREATININE 4.88* 6.85*  CALCIUM  9.0 9.1   Recent Labs  Lab 05/28/24 1547 05/29/24 0448  WBC 7.2 7.8  NEUTROABS 5.2  --   HGB 10.4* 10.2*  HCT 31.7* 31.4*  MCV 90.6 92.4  PLT 238 222   Liver Function Tests: Recent Labs  Lab 05/28/24 1547  AST 24  ALT 17  ALKPHOS 54  BILITOT 0.5  PROT 8.2*  ALBUMIN 4.0   No results for input(s): LIPASE, AMYLASE in the last 168 hours. No  results for input(s): AMMONIA in the last 168 hours. Cardiac Enzymes: No results for input(s): CKTOTAL, CKMB, CKMBINDEX, TROPONINI in the last 168 hours. Iron Studies: No results for input(s): IRON, TIBC, TRANSFERRIN, FERRITIN in the last 72 hours. PT/INR: @LABRCNTIP (inr:5)  Xrays/Other Studies: ) Results for orders placed or performed during the hospital encounter of 05/28/24 (from the past 48 hours)  Culture, blood (routine x 2)     Status: None (Preliminary result)   Collection Time: 05/28/24  3:45 PM   Specimen: BLOOD  Result Value Ref Range   Specimen Description BLOOD LEFT WRIST    Special Requests      BOTTLES DRAWN AEROBIC AND ANAEROBIC Blood Culture adequate volume   Culture      NO GROWTH < 24 HOURS Performed at [facility], [address] [place], [place] [address]    Report Status PENDING   Basic metabolic panel     Status: Abnormal   Collection Time: 05/28/24  3:47 PM  Result Value Ref Range   Sodium 137 135 - 145 mmol/L   Potassium 3.3 (L) 3.5 - 5.1 mmol/L   Chloride 95 (L) 98 - 111 mmol/L   CO2 29 22 - 32 mmol/L   Glucose, Bld 87 70 - 99 mg/dL    Comment: Glucose reference range applies only to samples taken after fasting for at least 8 hours.   BUN 18 8 - 23 mg/dL   Creatinine, Ser 5.11 (H) 0.61 - 1.24 mg/dL   Calcium  9.0 8.9 - 10.3 mg/dL   GFR, Estimated 13 (L) >60 mL/min    Comment: (NOTE) Calculated using the CKD-EPI Creatinine Equation (2021)    Anion gap 14 5 - 15    Comment: Performed at [facility], [address] [place], [place] [address]  CBC with Differential     Status: Abnormal   Collection Time: 05/28/24  3:47 PM  Result Value Ref Range   WBC 7.2 4.0 - 10.5 K/uL   RBC 3.50 (L) 4.22 - 5.81 MIL/uL   Hemoglobin 10.4 (L) 13.0 - 17.0 g/dL   HCT 68.2 (L) 60.9 - 47.9 %   MCV 90.6 80.0 - 100.0 fL   MCH 29.7 26.0 - 34.0 pg   MCHC 32.8 30.0 - 36.0 g/dL   RDW 86.0 88.4 - 84.4 %   Platelets 238 150 - 400 K/uL   nRBC 0.0 0.0 -  0.2 %   Neutrophils Relative % 72 %   Neutro Abs 5.2 1.7 - 7.7 K/uL   Lymphocytes Relative 17 %   Lymphs Abs 1.2 0.7 - 4.0 K/uL   Monocytes Relative 8 %   Monocytes Absolute 0.6 0.1 - 1.0 K/uL   Eosinophils Relative 2 %  Eosinophils Absolute 0.1 0.0 - 0.5 K/uL   Basophils Relative 0 %   Basophils Absolute 0.0 0.0 - 0.1 K/uL   Immature Granulocytes 1 %   Abs Immature Granulocytes 0.04 0.00 - 0.07 K/uL    Comment: Performed at [facility], [address], [place], [place] [address]  Lactic acid, plasma     Status: None   Collection Time: 05/28/24  3:47 PM  Result Value Ref Range   Lactic Acid, Venous 1.4 0.5 - 1.9 mmol/L    Comment: Performed at [facility], [address], [place], [place] [address]  Hepatic function panel     Status: Abnormal   Collection Time: 05/28/24  3:47 PM  Result Value Ref Range   Total Protein 8.2 (H) 6.5 - 8.1 g/dL   Albumin 4.0 3.5 - 5.0 g/dL   AST 24 15 - 41 U/L   ALT 17 0 - 44 U/L   Alkaline Phosphatase 54 38 - 126 U/L   Total Bilirubin 0.5 0.0 - 1.2 mg/dL   Bilirubin, Direct 0.2 0.0 - 0.2 mg/dL   Indirect Bilirubin 0.3 0.3 - 0.9 mg/dL    Comment: Performed at [facility], [address] [place], [place] [address]  Culture, blood (routine x 2)     Status: None (Preliminary result)   Collection Time: 05/28/24  4:39 PM   Specimen: BLOOD LEFT HAND  Result Value Ref Range   Specimen Description BLOOD LEFT HAND    Special Requests      AEROBIC BOTTLE ONLY Blood Culture results may not be optimal due to an inadequate volume of blood received in culture bottles   Culture      NO GROWTH < 24 HOURS Performed at [facility], [address] [place], [place] [address]    Report Status PENDING   Urinalysis, Routine w reflex microscopic -Urine, Catheterized     Status: Abnormal   Collection Time: 05/28/24  8:12 PM  Result Value Ref Range   Color, Urine YELLOW YELLOW   APPearance CLEAR CLEAR   Specific Gravity, Urine 1.008 1.005 - 1.030   pH 7.0 5.0 -  8.0   Glucose, UA NEGATIVE NEGATIVE mg/dL   Hgb urine dipstick SMALL (A) NEGATIVE   Bilirubin Urine NEGATIVE NEGATIVE   Ketones, ur NEGATIVE NEGATIVE mg/dL   Protein, ur 899 (A) NEGATIVE mg/dL   Nitrite NEGATIVE NEGATIVE   Leukocytes,Ua NEGATIVE NEGATIVE   RBC / HPF 6-10 0 - 5 RBC/hpf   WBC, UA 0-5 0 - 5 WBC/hpf   Bacteria, UA NONE SEEN NONE SEEN   Squamous Epithelial / HPF 0-5 0 - 5 /HPF    Comment: Performed at [facility], [address], [place], [place] [address]  Basic metabolic panel     Status: Abnormal   Collection Time: 05/29/24  4:48 AM  Result Value Ref Range   Sodium 136 135 - 145 mmol/L   Potassium 3.7 3.5 - 5.1 mmol/L   Chloride 95 (L) 98 - 111 mmol/L   CO2 28 22 - 32 mmol/L   Glucose, Bld 86 70 - 99 mg/dL    Comment: Glucose reference range applies only to samples taken after fasting for at least 8 hours.   BUN 26 (H) 8 - 23 mg/dL   Creatinine, Ser 3.14 (H) 0.61 - 1.24 mg/dL   Calcium  9.1 8.9 - 10.3 mg/dL   GFR, Estimated 9 (L) >60 mL/min    Comment: (NOTE) Calculated using the CKD-EPI Creatinine Equation (2021)    Anion  gap 14 5 - 15    Comment: Performed at [facility], [address] [place], [place] [address]  CBC     Status: Abnormal   Collection Time: 05/29/24  4:48 AM  Result Value Ref Range   WBC 7.8 4.0 - 10.5 K/uL   RBC 3.40 (L) 4.22 - 5.81 MIL/uL   Hemoglobin 10.2 (L) 13.0 - 17.0 g/dL   HCT 68.5 (L) 60.9 - 47.9 %   MCV 92.4 80.0 - 100.0 fL   MCH 30.0 26.0 - 34.0 pg   MCHC 32.5 30.0 - 36.0 g/dL   RDW 86.2 88.4 - 84.4 %   Platelets 222 150 - 400 K/uL   nRBC 0.0 0.0 - 0.2 %    Comment: Performed at [facility], [address] [place], [place] [address]   No results found.  PMH:   Past Medical History:  Diagnosis Date   Constipation    ESRD (end stage renal disease) (HCC)    [place]- TTHS   GERD (gastroesophageal reflux disease)    Hypertension    Paraplegic spinal paralysis (HCC)    Pre-diabetes    Seizure (HCC)    Stroke (HCC)     left side weakness, memory loss    PSH:   Past Surgical History:  Procedure Laterality Date   A/V FISTULAGRAM N/A 05/09/2020   Procedure: A/V FISTULAGRAM;  Surgeon: Sheree Penne Bruckner, MD;  Location: [facility] INVASIVE CV LAB;  Service: Cardiovascular;  Laterality: N/A;   A/V SHUNTOGRAM Right 03/21/2018   Procedure: A/V SHUNTOGRAM;  Surgeon: Harvey Carlin BRAVO, MD;  Location: [facility] INVASIVE CV LAB;  Service: Cardiovascular;  Laterality: Right;   AV FISTULA PLACEMENT Right 12/06/2015   Procedure: ARTERIOVENOUS (AV) FISTULA CREATION-RIGHT upper arm ;  Surgeon: Redell LITTIE Door, MD;  Location: [facility] OR;  Service: Vascular;  Laterality: Right;   AV FISTULA PLACEMENT Left 02/28/2021   Procedure: LEFT ARM ARTERIOVENOUS (AV) FISTULA CREATION;  Surgeon: Oris Krystal FALCON, MD;  Location: [facility] ORS;  Service: Vascular;  Laterality: Left;   BASCILIC VEIN TRANSPOSITION Right 08/29/2020   Procedure: RIGHT FIRST STAGE BASCILIC VEIN TRANSPOSITION VERSUS ARTERIOVENOUS GRAFT;  Surgeon: Eliza Bruckner RAMAN, MD;  Location: [facility] OR;  Service: Vascular;  Laterality: Right;   COLONOSCOPY N/A 09/30/2014   Procedure: COLONOSCOPY;  Surgeon: Lamar CHRISTELLA Hollingshead, MD;  Location: [facility] ENDO SUITE;  Service: Endoscopy;  Laterality: N/A;  2:30 AM - moved to 1:10 - Ginger to notify pt   CSF SHUNT     INSERTION OF DIALYSIS CATHETER Right 07/05/2020   Procedure: INSERTION OF TUNNEL  DIALYSIS CATHETER USING 19CM PRECISION CHRONIC CATHETER;  Surgeon: Eliza Bruckner RAMAN, MD;  Location: [facility];  Service: Vascular;  Laterality: Right;   IR DIALY SHUNT INTRO NEEDLE/INTRACATH INITIAL W/IMG LEFT Left 06/01/2020   IR FLUORO GUIDE CV LINE RIGHT  06/01/2020   LIGATION ARTERIOVENOUS GORTEX GRAFT Right 07/05/2020   Procedure: LIGATION EXCISION  ARTERIOVENOUS GORTEX GRAFT;  Surgeon: Eliza Bruckner RAMAN, MD;  Location: [facility] OR;  Service: Vascular;  Laterality: Right;   PERIPHERAL VASCULAR BALLOON ANGIOPLASTY Right 03/21/2018   Procedure: PERIPHERAL VASCULAR BALLOON  ANGIOPLASTY;  Surgeon: Harvey Carlin BRAVO, MD;  Location: [facility] INVASIVE CV LAB;  Service: Cardiovascular;  Laterality: Right;  AV GRAFT   PERIPHERAL VASCULAR INTERVENTION Right 05/09/2020   Procedure: PERIPHERAL VASCULAR INTERVENTION;  Surgeon: Sheree Penne Bruckner, MD;  Location: [facility] INVASIVE CV LAB;  Service: Cardiovascular;  Laterality: Right;   TUNNELLED CATHETER EXCHANGE N/A 05/18/2024   Procedure: TUNNELLED CATHETER EXCHANGE;  Surgeon: Magda Debby SAILOR, MD;  Location: [facility] LAB;  Service: Cardiovascular;  Laterality: N/A;    Allergies: No Known Allergies  Medications:   Prior to Admission medications   Medication Sig Start Date End Date Taking? Authorizing Provider  amLODipine  (NORVASC ) 5 MG tablet Take 5 mg by mouth daily. 08/15/20  Yes [provider]  cloNIDine  (CATAPRES  - DOSED IN MG/24 HR) 0.2 mg/24hr patch Place 1 patch (0.2 mg total) onto the skin once a week. 02/06/24  Yes Ricky Fines, MD  FLUoxetine  (PROZAC ) 10 MG capsule Take 10 mg by mouth daily. 08/15/20  Yes [provider]  furosemide  (LASIX ) 40 MG tablet Take 40 mg by mouth daily.    Yes [provider]  hydrALAZINE  (APRESOLINE ) 50 MG tablet Take 50 mg by mouth 3 (three) times daily. 05/28/24  Yes [provider]  levETIRAcetam  (KEPPRA ) 500 MG tablet Take 500 mg by mouth 2 (two) times daily. 08/15/20  Yes [provider]  losartan  (COZAAR ) 100 MG tablet Take 50 mg by mouth daily. 11/15/23  Yes [provider]  metoprolol  tartrate (LOPRESSOR ) 25 MG tablet Take 25 mg by mouth daily. 05/23/24  Yes [provider]  omeprazole (PRILOSEC) 20 MG capsule Take 20 mg by mouth daily. 05/23/24  Yes [provider]  sevelamer carbonate (RENVELA) 800 MG tablet Take 800-1,600 mg by mouth See admin instructions. 1600 mg with meals, 800 mg with snacks   Yes [provider]  metoprolol  succinate (TOPROL -XL) 50 MG 24 hr tablet Take 50 mg by mouth daily. 12/31/23    [provider]    Discontinued Meds:   Medications Discontinued During This Encounter  Medication Reason   hydrALAZINE  (APRESOLINE ) 100 MG tablet Dose change   hydrALAZINE  (APRESOLINE ) 25 MG tablet Dose change   prochlorperazine  (COMPAZINE ) 10 MG tablet Patient has not taken in last 30 days   pantoprazole  (PROTONIX ) 40 MG tablet Change in therapy    Social History:  reports that he has never smoked. He has never used smokeless tobacco. He reports that he does not currently use drugs after having used the following drugs: Cocaine. He reports that he does not drink alcohol.  Family History:   Family History  Problem Relation Age of Onset   Diabetes Mother     Blood pressure (!) 146/101, pulse 90, temperature 98.6 F (37 C), temperature source Oral, resp. rate 19, height 6' 2 (1.88 m), weight 90.5 kg, SpO2 96%. General appearance: alert, refusing to answer some questions stating that he already has rhythm, and no distress Head: Normocephalic, without obvious abnormality, atraumatic Resp: Clear bilaterally Cardio: regular rate and rhythm, S1, S2 normal, no murmur, click, rub or gallop GI: soft, non-tender; bowel sounds normal; no masses,  no organomegaly Extremities: extremities normal, atraumatic, no cyanosis or edema Access: RIJ TC, minimal tenderness over the tunnel tract but no erythema or calor, unable to express any discharge from the exit site.  2 thrombosed accesses in the right arm which do not appear to be infected       MELIA LYNWOOD ORN, MD 05/29/2024, 10:35 AM

## 2024-05-29 NOTE — Progress Notes (Addendum)
 Pt receives out-pt HD at [facility], 0800am chair time. Will continue to assist as needed.   Share[facility] [phone]

## 2024-05-30 DIAGNOSIS — K219 Gastro-esophageal reflux disease without esophagitis: Secondary | ICD-10-CM | POA: Diagnosis not present

## 2024-05-30 DIAGNOSIS — N186 End stage renal disease: Secondary | ICD-10-CM | POA: Diagnosis not present

## 2024-05-30 DIAGNOSIS — R7881 Bacteremia: Secondary | ICD-10-CM | POA: Diagnosis not present

## 2024-05-30 LAB — HEPATITIS B SURFACE ANTIBODY, QUANTITATIVE: Hep B S AB Quant (Post): 95.7 m[IU]/mL

## 2024-05-30 MED ORDER — ALTEPLASE 2 MG IJ SOLR
INTRAMUSCULAR | Status: AC
  Filled 2024-05-30: qty 2

## 2024-05-30 MED ORDER — ANTICOAGULANT SODIUM CITRATE 4% (200MG/5ML) IV SOLN: 5.0000 mL | Status: DC | PRN

## 2024-05-30 MED ORDER — LIDOCAINE-PRILOCAINE 2.5-2.5 % EX CREA: 1.0000 | TOPICAL_CREAM | CUTANEOUS | Status: DC | PRN

## 2024-05-30 MED ORDER — ALTEPLASE 2 MG IJ SOLR
2.0000 mg | Freq: Once | INTRAMUSCULAR | Status: AC
  Administered 2024-05-30: 2 mg

## 2024-05-30 MED ORDER — HEPARIN SODIUM (PORCINE) 1000 UNIT/ML DIALYSIS: 1000.0000 [IU] | INTRAMUSCULAR | Status: DC | PRN

## 2024-05-30 MED ORDER — ALTEPLASE 2 MG IJ SOLR
INTRAMUSCULAR | Status: AC
  Filled 2024-05-30: qty 4

## 2024-05-30 MED ORDER — LIDOCAINE HCL (PF) 1 % IJ SOLN: 5.0000 mL | INTRAMUSCULAR | Status: DC | PRN

## 2024-05-30 MED ORDER — HEPARIN SODIUM (PORCINE) 1000 UNIT/ML DIALYSIS: 3000.0000 [IU] | Freq: Once | INTRAMUSCULAR | Status: DC

## 2024-05-30 MED ORDER — ALTEPLASE 2 MG IJ SOLR
2.0000 mg | Freq: Once | INTRAMUSCULAR | Status: AC | PRN
  Administered 2024-05-30: 2 mg

## 2024-05-30 MED ORDER — PENTAFLUOROPROP-TETRAFLUOROETH EX AERO: 1.0000 | INHALATION_SPRAY | CUTANEOUS | Status: DC | PRN

## 2024-05-30 NOTE — Progress Notes (Signed)
 The patient's dialysis catheter is not functioning well, with a blood flow rate 200. The machine alarms every few minutes.Dr. Tobie notified and order received as follow: Lock TPA 30 minutes and retry.

## 2024-05-30 NOTE — Progress Notes (Signed)
 The patient did not meet the goal due to catheter positioner. UF off 1 L. BFR 150-200ml/min. TPA for catheter again after HD. Dr. Tobie stated that the HD catheter will be replaced next Monday.  05/30/24 1337  Vitals  Temp 98.4 F (36.9 C)  Temp Source Oral  BP (!) 146/95  BP Location Left Arm  BP Method Automatic  Patient Position (if appropriate) Lying  Resp 16  Weight 84.5 kg  Type of Weight Post-Dialysis  During Treatment Monitoring  HD Safety Checks Performed Yes  Intra-Hemodialysis Comments See progress note  Post Treatment  Dialyzer Clearance Clotted  Hemodialysis Intake (mL) 0 mL  Liters Processed 20  Fluid Removed (mL) 1000 mL  Tolerated HD Treatment Yes  Post-Hemodialysis Comments see notes.  Hemodialysis Catheter Right Internal jugular Double lumen Permanent (Tunneled)  Placement Date/Time: 05/18/24 1112   Serial / Lot #: [id number]  Expiration Date: 01/03/29  Time Out: Correct patient;Correct site;Correct procedure  Maximum sterile barrier precautions: Hand hygiene;Large sterile sheet;Cap;Sterile probe cover;Mask;Ste...  Site Condition No complications  Catheter fill solution Other (Comment) (TPA)  Catheter fill volume (Arterial) 1.9 cc  Catheter fill volume (Venous) 1.9  Dressing Type Gauze/Drain sponge;Transparent  Dressing Status Clean, Dry, Intact;Antimicrobial disc/dressing in place  Interventions Dressing changed  Drainage Description None  Dressing Change Due 06/06/24  Post treatment catheter status Capped and Clamped

## 2024-05-30 NOTE — Plan of Care (Signed)

## 2024-05-30 NOTE — Progress Notes (Deleted)
 On assessment, patient had pulled dressing from catheter site. PT, RN with HT, SN changed dressing per protocol to the site and instructed primary nurse, John, of dressing change to flush access. Dressing clean, dry, and intact.

## 2024-05-30 NOTE — Plan of Care (Signed)
   Problem: Activity: Goal: Risk for activity intolerance will decrease Outcome: Progressing   Problem: Nutrition: Goal: Adequate nutrition will be maintained Outcome: Progressing   Problem: Coping: Goal: Level of anxiety will decrease Outcome: Progressing

## 2024-05-30 NOTE — Treatment Plan (Signed)
 On assessment, patient had pulled dressing from catheter site. PT, RN with HT, SN changed dressing per protocol to the site and instructed primary nurse, John, of dressing change to flush access. Dressing clean, dry, and intact.    Nursing instructor with the student and dressing changed. Cosign of this note. Concur with above documentation,  Holley Cork, RN, MSN, CCM, Instructor for [facility]

## 2024-05-30 NOTE — Treatment Plan (Signed)
 On assessment, patient had pulled dressing from catheter site. PT, RN with HT, SN changed dressing per protocol to the site and instructed primary nurse, John, of dressing change to flush access. Dressing clean, dry, and intact.

## 2024-05-30 NOTE — Progress Notes (Signed)
 [facility]                                                                               Bryce Miller, is a 63 y.o. male, DOB - [birth year], FMW:[id number] Admit date - 05/28/2024    Outpatient Primary MD for the patient is Fanta, Benita Area, MD  LOS - 2  days    Brief summary   63 y.o. male,  with medical history significant of ESRD, dialysis dependent on TTS schedule, prior CVA with left-sided spinal paralysis, history of hypertension, seizure disorder, hypertension, GERD was sent to the ED from dialysis center for positive blood cultures, apparently blood cultures obtained 05/23/2024 as patient developed fever and chills during dialysis, blood cultures came back positive for Proteus bacteremia. TDC catheter exchanged by vascular surgery Dr. Jeannetta on 05/18/2024. Dr Melia nephrologist informed.    Assessment & Plan    Assessment and Plan:  Proteus bacteremia present on admission.  Blood cultures from HD catheter on 10/13 shows Proteus mirabilis bacteremia Continue with IV antibiotics after HD. Repeat blood cultures drawn on 10/24 and pending no evidence of urinary tract infection right upper quadrant ultrasound showed no cholecystolithiasis or changes of acute cholecystitis. No intrahepatic or extrahepatic biliary ductal dilation. Patient denies any respiratory symptoms Continue to monitor    End-stage renal disease on dialysis Tuesdays Thursdays and Saturdays.  GERD Continue with PPI  History of stroke With left-sided weakness Therapy evaluations ordered  Hypertension Blood pressure parameters appear to be optimal.    Mild normocytic anemia Hemoglobin around 10.    Estimated body mass index is 24.2 kg/m as calculated from the following:   Height as of this encounter: 6' 2 (1.88 m).   Weight as of this encounter: 85.5 kg.  Code Status: full code.  DVT Prophylaxis:  heparin  injection 5,000 Units Start: 05/28/24 2200   Level of Care:  Level of care: Med-Surg Family Communication: None at bedside  Disposition Plan:     Remains inpatient appropriate:  pending clnical improvement.   Procedures:  None.   Consultants:   Nephrology.   Antimicrobials:   Anti-infectives (From admission, onward)    Start     Dose/Rate Route Frequency Ordered Stop   05/30/24 1600  ceFEPIme  (MAXIPIME ) 2 g in sodium chloride  0.9 % 100 mL IVPB        2 g 200 mL/hr over 30 Minutes Intravenous Every T-Th-Sa (Hemodialysis) 05/29/24 1023          Medications  Scheduled Meds:  amLODipine   5 mg Oral Daily   Chlorhexidine  Gluconate Cloth  6 each Topical Daily   Chlorhexidine  Gluconate Cloth  6 each Topical Q0600   cloNIDine   0.2 mg Transdermal Weekly   FLUoxetine   10 mg Oral Daily   furosemide   40 mg Oral Daily   heparin   3,000 Units Dialysis Once in dialysis   heparin   5,000 Units Subcutaneous Q8H   hydrALAZINE   50 mg Oral TID   levETIRAcetam   500 mg Oral BID   losartan   50 mg Oral Daily   metoprolol  tartrate  25 mg Oral Daily   pantoprazole   40 mg Oral Daily   sevelamer carbonate  1,600 mg Oral TID WC   sodium chloride  flush  10-40 mL Intracatheter Q12H   Continuous Infusions:  anticoagulant sodium citrate     ceFEPime  (MAXIPIME ) IV     PRN Meds:.acetaminophen  **OR** acetaminophen , anticoagulant sodium citrate, heparin , hydrALAZINE , lidocaine  (PF), lidocaine -prilocaine , pentafluoroprop-tetrafluoroeth, sodium chloride  flush    Subjective:   Bryce Miller was seen and examined today.  No new complaints.   Objective:   Vitals:   05/30/24 0607 05/30/24 1000 05/30/24 1014 05/30/24 1030  BP: (!) 156/104 (!) 144/93 (!) 140/90   Pulse: 95 89 93   Resp: 16 16 16 16   Temp: 98.2 F (36.8 C) 98.4 F (36.9 C)    TempSrc: Oral Oral    SpO2: 92% 99%    Weight:   85.5 kg   Height:        Intake/Output Summary (Last 24 hours) at 05/30/2024 1055 Last data filed at 05/30/2024 0635 Gross per 24 hour  Intake 370 ml  Output 650  ml  Net -280 ml   Filed Weights   05/28/24 1530 05/30/24 1014  Weight: 90.5 kg 85.5 kg     Exam General exam: Appears calm and comfortable  Respiratory system: Clear to auscultation. Respiratory effort normal. Cardiovascular system: S1 & S2 heard, RRR. Gastrointestinal system: Abdomen is nondistended, soft and nontender.  Central nervous system: Alert and oriented.  Extremities: Symmetric 5 x 5 power. Skin: No rashes,  Psychiatry: Mood & affect appropriate.     Data Reviewed:  I have personally reviewed following labs and imaging studies   CBC Lab Results  Component Value Date   WBC 7.8 05/29/2024   RBC 3.40 (L) 05/29/2024   HGB 10.2 (L) 05/29/2024   HCT 31.4 (L) 05/29/2024   MCV 92.4 05/29/2024   MCH 30.0 05/29/2024   PLT 222 05/29/2024   MCHC 32.5 05/29/2024   RDW 13.7 05/29/2024   LYMPHSABS 1.2 05/28/2024   MONOABS 0.6 05/28/2024   EOSABS 0.1 05/28/2024   BASOSABS 0.0 05/28/2024     Last metabolic panel Lab Results  Component Value Date   NA 136 05/29/2024   K 3.7 05/29/2024   CL 95 (L) 05/29/2024   CO2 28 05/29/2024   BUN 26 (H) 05/29/2024   CREATININE 6.85 (H) 05/29/2024   GLUCOSE 86 05/29/2024   GFRNONAA 9 (L) 05/29/2024   GFRAA 11 (L) 05/24/2018   CALCIUM  9.1 05/29/2024   PHOS 4.6 02/06/2024   PROT 8.2 (H) 05/28/2024   ALBUMIN 4.0 05/28/2024   BILITOT 0.5 05/28/2024   ALKPHOS 54 05/28/2024   AST 24 05/28/2024   ALT 17 05/28/2024   ANIONGAP 14 05/29/2024    CBG (last 3)  No results for input(s): GLUCAP in the last 72 hours.    Coagulation Profile: No results for input(s): INR, PROTIME in the last 168 hours.   Radiology Studies: US  Abdomen Limited RUQ (LIVER/GB) Result Date: 05/29/2024 CLINICAL DATA:  10142 Bacteremia [id number] , right upper quadrant pain EXAM: ULTRASOUND ABDOMEN LIMITED RIGHT UPPER QUADRANT COMPARISON:  03/21/2021 FINDINGS: Gallbladder: No gallstones. No wall thickening or pericholecystic fluid. No sonographic  Rodgers's sign noted by sonographer. Common bile duct: Diameter: 5 mm Liver: Normal echogenicity. No focal lesion identified. No intrahepatic biliary ductal dilation. Portal vein is patent on color Doppler imaging with normal direction of blood flow towards the liver. Right Kidney: Diffuse renal cortical atrophy with multiple cysts filling the renal parenchyma, overall similar in appearance to the prior study. Other: None. IMPRESSION: No cholecystolithiasis or changes of acute  cholecystitis. No intrahepatic or extrahepatic biliary ductal dilation. Electronically Signed   By: Rogelia Myers M.D.   On: 05/29/2024 13:42       Elgie Butter M.D. [facility] 05/30/2024, 10:55 AM  Available via Epic secure chat 7am-7pm After 7 pm, please refer to night coverage provider listed on amion.

## 2024-05-31 ENCOUNTER — Inpatient Hospital Stay (HOSPITAL_COMMUNITY)

## 2024-05-31 DIAGNOSIS — G8220 Paraplegia, unspecified: Secondary | ICD-10-CM | POA: Diagnosis not present

## 2024-05-31 DIAGNOSIS — I10 Essential (primary) hypertension: Secondary | ICD-10-CM

## 2024-05-31 DIAGNOSIS — R7881 Bacteremia: Secondary | ICD-10-CM | POA: Diagnosis not present

## 2024-05-31 DIAGNOSIS — R569 Unspecified convulsions: Secondary | ICD-10-CM

## 2024-05-31 DIAGNOSIS — N186 End stage renal disease: Secondary | ICD-10-CM | POA: Diagnosis not present

## 2024-05-31 MED ORDER — IOHEXOL 300 MG/ML  SOLN
100.0000 mL | Freq: Once | INTRAMUSCULAR | Status: AC | PRN
  Administered 2024-05-31: 100 mL via INTRAVENOUS

## 2024-05-31 MED ORDER — IOHEXOL 9 MG/ML PO SOLN: 500.0000 mL | ORAL | Status: AC

## 2024-05-31 MED ORDER — IOHEXOL 9 MG/ML PO SOLN
ORAL | Status: AC
  Filled 2024-05-31: qty 1000

## 2024-05-31 NOTE — Assessment & Plan Note (Addendum)
 Continue supportive care  Depression continue with fluoxetine ,

## 2024-05-31 NOTE — Assessment & Plan Note (Signed)
 Continue with pantoprazole 

## 2024-05-31 NOTE — Plan of Care (Signed)
  Problem: Education: Goal: Knowledge of General Education information will improve Description: Including pain rating scale, medication(s)/side effects and non-pharmacologic comfort measures Outcome: Progressing   Problem: Clinical Measurements: Goal: Diagnostic test results will improve Outcome: Progressing   Problem: Health Behavior/Discharge Planning: Goal: Ability to manage health-related needs will improve Outcome: Progressing   Problem: Coping: Goal: Level of anxiety will decrease Outcome: Progressing

## 2024-05-31 NOTE — Assessment & Plan Note (Signed)
Continue levetiracetam. 

## 2024-05-31 NOTE — Plan of Care (Signed)

## 2024-05-31 NOTE — Assessment & Plan Note (Addendum)
 Continue blood pressure monitoring  Continue amlodipine , losartan , furosemide  and metoprolol .  Clonidine .  PRN hydralazine ,

## 2024-05-31 NOTE — Progress Notes (Signed)
  Progress Note   Patient: Bryce Miller FMW:[id number] DOB: 27-Feb-1961 DOA: 05/28/2024     3 DOS: the patient was seen and examined on 05/31/2024   Brief hospital course: Bryce Miller was admitted to the hospital with the working diagnosis of bacteremia.   63 yo male with the past medical history of ESRD on HD, CVA with left hemiparesis, hypertension, GERD and seizures who presented with positive blood culture. 10/18 he had fever and chills during hemodialysis, blood cultures were obtained and resulted positive for Proteus.  Recent exchange of dialysis catheter by vascular surgery Dr Bryce Miller on 05/18/24.  On his initial physical examination his blood pressure was 159/95. HR 94, RR 15 and 02 saturation 96% Lungs with no wheezing or rhonchi, heart with S1 and S2 present with no gallops, rubs or murmurs, respiratory with no rales or wheezing, abdomen with no distention, no lower extremity edema    Na 137, K 3,3 CL 95 bicarbonate 29 glucose 87 bun 18 cr 4,8  Wbc 7,2 hgb 10,4 plt 238   Abdomen US  with no cholelithiasis or changes of acute cholecystitis, with no intrahepatic or extrahepatic biliary dilatation.  Patient was placed on IV antibiotic therapy.  Follow up blood cultures have obtained.   Assessment and Plan: Bacteremia Positive for Proteus Mirabilis,  Follow up cultures with no growth Patient with no leukocytosis and no fever.   Plan to continue antibiotic therapy with cefepime , follow up with sensitivities.  Further work up with CT abdomen and pelvis.  Follow up cell count, temperature curve and cultures.   ESRD (end stage renal disease) (HCC) Hypokalemia.   Follow up renal function in am.  No signs of volume overload  Continue renal replacement therapy per nephrology recommendations No clinical signs of catheter being infected.   Metabolic bone disease, continue with sevelamer.   Paraplegic spinal paralysis (HCC) Continue supportive care  Depression continue with  fluoxetine ,   Essential hypertension Continue blood pressure monitoring  Continue amlodipine , losartan , furosemide  and metoprolol .  Clonidine .  PRN hydralazine ,   Seizure (HCC) Continue levetiracetam .   Gastroesophageal reflux disease Continue with pantoprazole .         Subjective: Patient with no chest pain and no dyspnea, no nausea or vomiting, no dysuria   Physical Exam: Vitals:   05/31/24 0528 05/31/24 0633 05/31/24 1000 05/31/24 1345  BP: (!) 177/106 (!) 150/109 (!) 153/86 (!) 140/105  Pulse: (!) 102   79  Resp: 20   18  Temp: 98.3 F (36.8 C)     TempSrc: Oral     SpO2: 98%  97%   Weight:      Height:       Neurology awake and alert ENT with mild pallor with no icterus Cardiovascular with S1 and S2 present and regular, with systolic murmur at the left lower sternal border Respiratory with no rales or wheezing, no rhonchi  Abdomen soft and not distended, not tender No lower extremity edema HD catheter at the right upper chest with no local erythema or edema   Data Reviewed:    Family Communication: I spoke with patient's daughter  at the bedside, we talked in detail about patient's condition, plan of care and prognosis and all questions were addressed.   Disposition: Status is: Inpatient Remains inpatient appropriate because: IV antibiotic therapy   Planned Discharge Destination: Home   Author: Mouhamad Teed Daniel Marylu Dudenhoeffer, MD 05/31/2024 4:15 PM  For on call review [email or web address].

## 2024-05-31 NOTE — Hospital Course (Addendum)
 Mr. Hoback was admitted to the hospital with the working diagnosis of bacteremia.   63 yo male with the past medical history of ESRD on HD, CVA with left hemiparesis, hypertension, GERD and seizures who presented with positive blood culture. 10/18 he had fever and chills during hemodialysis, blood cultures were obtained and resulted positive for Proteus.  Recent exchange of dialysis catheter by vascular surgery Dr Jeannetta on 05/18/24.  On his initial physical examination his blood pressure was 159/95. HR 94, RR 15 and 02 saturation 96% Lungs with no wheezing or rhonchi, heart with S1 and S2 present with no gallops, rubs or murmurs, respiratory with no rales or wheezing, abdomen with no distention, no lower extremity edema    Na 137, K 3,3 CL 95 bicarbonate 29 glucose 87 bun 18 cr 4,8  Wbc 7,2 hgb 10,4 plt 238  Urine analysis SG 1,008, protein 100, wbc 0-5 and rbc 6-10   Abdomen US  with no cholelithiasis or changes of acute cholecystitis, with no intrahepatic or extrahepatic biliary dilatation.  Patient was placed on IV antibiotic therapy.  Follow up blood cultures have obtained.

## 2024-05-31 NOTE — Progress Notes (Addendum)
 Hydralazine  given for Hypertension. SBP improved 150/109 informed tech to inform dayshift tech to take first.   05/31/24 0528  Assess: MEWS Score  Temp 98.3 F (36.8 C)  BP (!) 177/106  MAP (mmHg) 129  Pulse Rate (!) 102  Resp 20  SpO2 98 %  Assess: MEWS Score  MEWS Temp 0  MEWS Systolic 0  MEWS Pulse 1  MEWS RR 0  MEWS LOC 0  MEWS Score 1  MEWS Score Color Green  Assess: SIRS CRITERIA  SIRS Temperature  0  SIRS Respirations  0  SIRS Pulse 1  SIRS WBC 0  SIRS Score Sum  1

## 2024-05-31 NOTE — Assessment & Plan Note (Signed)
 Positive for Proteus Mirabilis,  Follow up cultures with no growth Patient with no leukocytosis and no fever.   Plan to continue antibiotic therapy with cefepime , follow up with sensitivities.  Further work up with CT abdomen and pelvis.  Follow up cell count, temperature curve and cultures.

## 2024-05-31 NOTE — Assessment & Plan Note (Addendum)
 Hypokalemia.   Follow up renal function in am.  No signs of volume overload  Continue renal replacement therapy per nephrology recommendations No clinical signs of catheter being infected.   Metabolic bone disease, continue with sevelamer.

## 2024-06-01 DIAGNOSIS — N186 End stage renal disease: Secondary | ICD-10-CM | POA: Diagnosis not present

## 2024-06-01 DIAGNOSIS — G8220 Paraplegia, unspecified: Secondary | ICD-10-CM | POA: Diagnosis not present

## 2024-06-01 DIAGNOSIS — R7881 Bacteremia: Secondary | ICD-10-CM | POA: Diagnosis not present

## 2024-06-01 DIAGNOSIS — K219 Gastro-esophageal reflux disease without esophagitis: Secondary | ICD-10-CM | POA: Diagnosis not present

## 2024-06-01 LAB — RENAL FUNCTION PANEL
Albumin: 3.5 g/dL (ref 3.5–5.0)
Anion gap: 19 — ABNORMAL HIGH (ref 5–15)
BUN: 63 mg/dL — ABNORMAL HIGH (ref 8–23)
CO2: 21 mmol/L — ABNORMAL LOW (ref 22–32)
Calcium: 9 mg/dL (ref 8.9–10.3)
Chloride: 93 mmol/L — ABNORMAL LOW (ref 98–111)
Creatinine, Ser: 13.2 mg/dL — ABNORMAL HIGH (ref 0.61–1.24)
GFR, Estimated: 4 mL/min — ABNORMAL LOW (ref >60)
Glucose, Bld: 81 mg/dL (ref 70–99)
Phosphorus: 6.7 mg/dL — ABNORMAL HIGH (ref 2.5–4.6)
Potassium: 4.2 mmol/L (ref 3.5–5.1)
Sodium: 133 mmol/L — ABNORMAL LOW (ref 135–145)

## 2024-06-01 LAB — CBC
HCT: 26.8 % — ABNORMAL LOW (ref 39.0–52.0)
Hemoglobin: 9 g/dL — ABNORMAL LOW (ref 13.0–17.0)
MCH: 29.4 pg (ref 26.0–34.0)
MCHC: 33.6 g/dL (ref 30.0–36.0)
MCV: 87.6 fL (ref 80.0–100.0)
Platelets: 301 K/uL (ref 150–400)
RBC: 3.06 MIL/uL — ABNORMAL LOW (ref 4.22–5.81)
RDW: 13.6 % (ref 11.5–15.5)
WBC: 7.4 K/uL (ref 4.0–10.5)
nRBC: 0 % (ref 0.0–0.2)

## 2024-06-01 NOTE — Plan of Care (Signed)
  Problem: Acute Rehab OT Goals (only OT should resolve) Goal: Pt. Will Perform Grooming Flowsheets (Taken 06/01/2024 1552) Pt Will Perform Grooming:  with modified independence  sitting Goal: Pt. Will Perform Upper Body Dressing Flowsheets (Taken 06/01/2024 1552) Pt Will Perform Upper Body Dressing:  with modified independence  sitting Goal: Pt. Will Transfer To Toilet Flowsheets (Taken 06/01/2024 1552) Pt Will Transfer to Toilet:  with contact guard assist  stand pivot transfer  Kassadee Carawan OT, MOT

## 2024-06-01 NOTE — Progress Notes (Signed)
 PHARMACY CONSULT NOTE FOR:  OUTPATIENT  PARENTERAL ANTIBIOTIC THERAPY (OPAT)  Indication: bacteremia Regimen: Ceftazidime 2000 mg IV every T-Th-Sat w/ HD End date: thru the end of 11/6  IV antibiotic discharge orders are pended. To discharging provider:  please sign these orders via discharge navigator,  Select New Orders & click on the button choice - Manage This Unsigned Work.     Thank you for allowing pharmacy to be a part of this patient's care.  Bryce Miller 06/01/2024, 4:12 PM

## 2024-06-01 NOTE — Discharge Summary (Signed)
 Physician Discharge Summary   Patient: Bryce Miller MRN: [id number] DOB: 04-Jan-1961  Admit date:     05/28/2024  Discharge date: 06/01/24  Discharge Physician: Eric Nunnery   PCP: Carlette Benita Area, MD   Recommendations at discharge:  Repeat CBC to follow hemoglobin trend/stability Reassess blood pressure and adjust medication as needed.  Discharge Diagnoses: Active Problems:   Bacteremia   ESRD (end stage renal disease) (HCC)   Paraplegic spinal paralysis (HCC)   Essential hypertension   Seizure (HCC)   Gastroesophageal reflux disease  Brief hospital course: Mr. Bryce Miller was admitted to the hospital with the working diagnosis of bacteremia.    63 yo male with the past medical history of ESRD on HD, CVA with left hemiparesis, hypertension, GERD and seizures who presented with positive blood culture. 10/18 he had fever and chills during hemodialysis, blood cultures were obtained and resulted positive for Proteus.  Recent exchange of dialysis catheter by vascular surgery Dr Jeannetta on 05/18/24.  On his initial physical examination his blood pressure was 159/95. HR 94, RR 15 and 02 saturation 96% Lungs with no wheezing or rhonchi, heart with S1 and S2 present with no gallops, rubs or murmurs, respiratory with no rales or wheezing, abdomen with no distention, no lower extremity edema     Na 137, K 3,3 CL 95 bicarbonate 29 glucose 87 bun 18 cr 4,8  Wbc 7,2 hgb 10,4 plt 238    Abdomen US  with no cholelithiasis or changes of acute cholecystitis, with no intrahepatic or extrahepatic biliary dilatation.  Patient was placed on IV antibiotic therapy.  Patient's blood culture remains without any growth; afebrile and with normal WBCs.  After discussing with ID service decision made to complete 2 weeks total of IV antibiotics and patient can go home with the use of Fortaz as an outpatient on dialysis days.  Assessment and Plan: Bacteremia Positive for Proteus Mirabilis,  Repeat  cultures with no growth Patient with no leukocytosis and no fever.  After discussing with ID plan is for 2 weeks Melida as an outpatient (last anticipated antibiotic date 06/11/2024). Patient discharged home hemodynamically stable and hemodialysis service aware to provide treatment as an outpatient.  ESRD (end stage renal disease) (HCC) -Continue outpatient hemodialysis treatment - Regular dates Tuesday-Thursday and Saturday. - Appreciate assistance and recommendation by nephrology service.  Metabolic bone disease -continue with sevelamer.   Paraplegic spinal paralysis (HCC) -Continue supportive care  Depression - Continue treatment with fluoxetine  - No suicidal ideation or hallucination - Stable.    Essential hypertension -Continue current antihypertensive regimen - Heart healthy/low-sodium diet discussed with patient.  Seizure (HCC) -Continue levetiracetam .   Gastroesophageal reflux disease -Continue treatment with PPI.  Consultants: ID curbside; nephrology service. Procedures performed: See below for x-ray reports. Disposition: Home Diet recommendation: Heart healthy/low-sodium diet.  DISCHARGE MEDICATION: Allergies as of 06/01/2024   No Known Allergies      Medication List     STOP taking these medications    metoprolol  succinate 50 MG 24 hr tablet Commonly known as: TOPROL -XL       TAKE these medications    amLODipine  5 MG tablet Commonly known as: NORVASC  Take 5 mg by mouth daily.   cloNIDine  0.2 mg/24hr patch Commonly known as: CATAPRES  - Dosed in mg/24 hr Place 1 patch (0.2 mg total) onto the skin once a week.   FLUoxetine  10 MG capsule Commonly known as: PROZAC  Take 10 mg by mouth daily.   furosemide  40 MG tablet Commonly known as: LASIX  Take  40 mg by mouth daily.   hydrALAZINE  50 MG tablet Commonly known as: APRESOLINE  Take 50 mg by mouth 3 (three) times daily.   levETIRAcetam  500 MG tablet Commonly known as: KEPPRA  Take 500 mg by  mouth 2 (two) times daily.   losartan  100 MG tablet Commonly known as: COZAAR  Take 50 mg by mouth daily.   metoprolol  tartrate 25 MG tablet Commonly known as: LOPRESSOR  Take 25 mg by mouth daily.   omeprazole 20 MG capsule Commonly known as: PRILOSEC Take 20 mg by mouth daily.   sevelamer carbonate 800 MG tablet Commonly known as: RENVELA Take 800-1,600 mg by mouth See admin instructions. 1600 mg with meals, 800 mg with snacks        Follow-up Information     Fanta, Benita Area, MD. Schedule an appointment as soon as possible for a visit in 10 day(s).   Specialty: Internal Medicine Contact information: [address] [place] [address] [phone]                Discharge Exam: Filed Weights   05/28/24 1530 05/30/24 1014 05/30/24 1337  Weight: 90.5 kg 85.5 kg 84.5 kg   Neurology awake and alert; in no acute distress.  Hemodynamically stable. ENT with mild pallor with no icterus Cardiovascular with S1 and S2 present and regular, with systolic murmur at the left lower sternal border Respiratory with no rales or wheezing, no rhonchi  Abdomen soft and not distended, not tender No lower extremity edema HD catheter at the right upper chest with no local erythema or edema     Condition at discharge: Stable.  The results of significant diagnostics from this hospitalization (including imaging, microbiology, ancillary and laboratory) are listed below for reference.   Imaging Studies: CT ABDOMEN PELVIS W CONTRAST Result Date: 05/31/2024 EXAM: CT ABDOMEN AND PELVIS WITH CONTRAST 05/31/2024 07:01:34 PM TECHNIQUE: CT of the abdomen and pelvis was performed with the administration of 100 mL of iohexol  (OMNIPAQUE ) 300 MG/ML solution. Multiplanar reformatted images are provided for review. Automated exposure control, iterative reconstruction, and/or weight-based adjustment of the mA/kV was utilized to reduce the radiation dose to as low as reasonably  achievable. COMPARISON: 05/21/2021 CLINICAL HISTORY: Sepsis; bacteremia. Sepsis, bacteremia, abnormal labs. FINDINGS: LOWER CHEST: Central venous catheter tip in superior right atrium. Cardiomegaly. Extensive multi-vessel coronary artery calcification. Mild bibasilar atelectasis. No acute abnormality. LIVER: The liver is unremarkable. GALLBLADDER AND BILE DUCTS: Gallbladder is unremarkable. No biliary ductal dilatation. SPLEEN: No acute abnormality. PANCREAS: No acute abnormality. ADRENAL GLANDS: No acute abnormality. KIDNEYS, URETERS AND BLADDER: Innumerable simple and complex cysts bilaterally consistent with polycystic kidney disease. Dominant 6.0 cm simple cyst in the right lower kidney (image 48). Dominant 4.0 cm hemorrhagic lesion in the lateral left upper kidney (image 34) likely reflecting a benign hemorrhagic cyst, but a solid mass is not excluded on an enhanced CT. Suspected vascular calcification at the left renal hilum (image 37), chronic. No stones in the kidneys or ureters. No hydronephrosis. No perinephric or periureteral stranding. Urinary bladder is unremarkable. GI AND BOWEL: Stomach demonstrates no acute abnormality. Moderate rectal stool burden, suggesting fecal impaction, although without superimposed inflammatory changes to suggest stercoral colitis. There is no bowel obstruction. PERITONEUM AND RETROPERITONEUM: No ascites. No free air. VASCULATURE: Aorta is normal in caliber. Aortic atherosclerosis. LYMPH NODES: No lymphadenopathy. REPRODUCTIVE ORGANS: The prostate is unremarkable. BONES AND SOFT TISSUES: No acute osseous abnormality. No focal soft tissue abnormality. IMPRESSION: 1. Moderate rectal stool burden, suggesting fecal impaction, without stercoral  colitis. 2. Otherwise, no acute findings. 3. Polycystic kidney disease. Dominent 4.0 cm hyperdense lesion in the left upper kidney, possibly a benign hemorrhagic cyst, but indeterminate. Electronically signed by: Pinkie Pebbles MD  05/31/2024 07:36 PM EDT RP Workstation: [id number]   US  Abdomen Limited RUQ (LIVER/GB) Result Date: 05/29/2024 CLINICAL DATA:  10142 Bacteremia 89857 , right upper quadrant pain EXAM: ULTRASOUND ABDOMEN LIMITED RIGHT UPPER QUADRANT COMPARISON:  03/21/2021 FINDINGS: Gallbladder: No gallstones. No wall thickening or pericholecystic fluid. No sonographic Diefendorf's sign noted by sonographer. Common bile duct: Diameter: 5 mm Liver: Normal echogenicity. No focal lesion identified. No intrahepatic biliary ductal dilation. Portal vein is patent on color Doppler imaging with normal direction of blood flow towards the liver. Right Kidney: Diffuse renal cortical atrophy with multiple cysts filling the renal parenchyma, overall similar in appearance to the prior study. Other: None. IMPRESSION: No cholecystolithiasis or changes of acute cholecystitis. No intrahepatic or extrahepatic biliary ductal dilation. Electronically Signed   By: Rogelia Myers M.D.   On: 05/29/2024 13:42   PERIPHERAL VASCULAR CATHETERIZATION Result Date: 05/18/2024 DATE OF SERVICE: 05/18/2024  PATIENT:  Debby Chancy  63 y.o. male  PRE-OPERATIVE DIAGNOSIS:  end-stage renal disease; malfunctioning TDC  POST-OPERATIVE DIAGNOSIS:  Same  PROCEDURE:  exchange of tunneled dialysis catheter (CPT [phone]) conscious sedation (16 minutes) (CPT [phone]) established outpatient evaluation and management - level 3 (CPT 99213)  SURGEON:  Debby SAILOR. Magda, MD  ASSISTANT: none  ANESTHESIA:   local and IV sedation  ESTIMATED BLOOD LOSS: min  LOCAL MEDICATIONS USED:  LIDOCAINE   COUNTS: confirmed correct.  PATIENT DISPOSITION:  PACU - hemodynamically stable.  Delay start of Pharmacological VTE agent (>24hrs) due to surgical blood loss or risk of bleeding: no  INDICATION FOR PROCEDURE: Henley Boettner is a 63 y.o. male with ESRD on HD. He dialyzes through a [facility] which is not working well. After careful discussion of risks, benefits, and alternatives the patient was offered  catheter exchange. The patient understood and wished to proceed.  OPERATIVE FINDINGS: Successful exchange of tunneled dialysis catheter  DESCRIPTION OF PROCEDURE: After identification of the patient in the pre-operative holding area, the patient was transferred to the operating room. The patient was positioned supine on the operating room table.  The right neck and chest were was prepped and draped in standard fashion. A surgical pause was performed confirming correct patient, procedure, and operative location.  A stiff angled Glidewire was advanced through the existing catheter into the right ventricle.  The catheter was freed from the subcutaneous attachments by freeing the felt cuff.  Once the catheter was freed, the catheter was removed over the wire.  A new 23 cm palindrome tunneled dialysis catheter was advanced with the assistance of stylette's over the wire into the position in the right atrium.  The stylette's and wire were removed.  The catheter was tested and found to function well.  The catheter was secured to the chest wall with 2 nylon stitches.  Dermabond was applied to the exit site.  Conscious sedation was administered with the use of IV fentanyl  and midazolam  under continuous physician and nurse monitoring.  Heart rate, blood pressure, and oxygen saturation were continuously monitored.  Total sedation time was 16 minutes  Upon completion of the case instrument and sharps counts were confirmed correct. The patient was transferred to the PACU in good condition. I was present for all portions of the procedure.  PLAN: Okay to use catheter at any point.  Follow-up as needed  Debby  GEANNIE Robertson, MD FACS [facility] Phone Number: [phone] 05/18/2024 11:20 AM    Microbiology: Results for orders placed or performed during the hospital encounter of 05/28/24  Culture, blood (routine x 2)     Status: None (Preliminary result)   Collection Time: 05/28/24  3:45 PM    Specimen: BLOOD  Result Value Ref Range Status   Specimen Description BLOOD LEFT WRIST  Final   Special Requests   Final    BOTTLES DRAWN AEROBIC AND ANAEROBIC Blood Culture adequate volume   Culture   Final    NO GROWTH 4 DAYS Performed at [facility], [address] [place], [place] [address]    Report Status PENDING  Incomplete  Culture, blood (routine x 2)     Status: None (Preliminary result)   Collection Time: 05/28/24  4:39 PM   Specimen: BLOOD LEFT HAND  Result Value Ref Range Status   Specimen Description BLOOD LEFT HAND  Final   Special Requests   Final    AEROBIC BOTTLE ONLY Blood Culture results may not be optimal due to an inadequate volume of blood received in culture bottles   Culture   Final    NO GROWTH 4 DAYS Performed at [facility], [address] [place], [place] [address]    Report Status PENDING  Incomplete    Labs: CBC: Recent Labs  Lab 05/28/24 1547 05/29/24 0448 06/01/24 0506  WBC 7.2 7.8 7.4  NEUTROABS 5.2  --   --   HGB 10.4* 10.2* 9.0*  HCT 31.7* 31.4* 26.8*  MCV 90.6 92.4 87.6  PLT 238 222 301   Basic Metabolic Panel: Recent Labs  Lab 05/28/24 1547 05/29/24 0448 06/01/24 0506  NA 137 136 133*  K 3.3* 3.7 4.2  CL 95* 95* 93*  CO2 29 28 21*  GLUCOSE 87 86 81  BUN 18 26* 63*  CREATININE 4.88* 6.85* 13.20*  CALCIUM  9.0 9.1 9.0  PHOS  --   --  6.7*   Liver Function Tests: Recent Labs  Lab 05/28/24 1547 06/01/24 0506  AST 24  --   ALT 17  --   ALKPHOS 54  --   BILITOT 0.5  --   PROT 8.2*  --   ALBUMIN 4.0 3.5   CBG: No results for input(s): GLUCAP in the last 168 hours.  Discharge time spent:  35 minutes.  Signed: Eric Nunnery, MD [facility] 06/01/2024

## 2024-06-01 NOTE — Evaluation (Addendum)
 Occupational Therapy Evaluation Patient Details Name: Bryce Miller MRN: [id number] DOB: April 20, 1961 Today's Date: 06/01/2024   History of Present Illness   Bryce Miller  is a 63 y.o. male,  with medical history significant of ESRD, dialysis dependent on TTS schedule, prior CVA with left-sided spinal paralysis, history of hypertension, seizure disorder, hypertension, GERD was sent to the ED from dialysis center for positive blood cultures, apparently blood cultures obtained 05/23/2024 as patient developed fever and chills during dialysis, blood cultures came back positive for Proteus  bacteremia, patient poor historian, but sister reports he had history of fever last Tuesday during dialysis, patient still making urine, but he denies any dysuria, patient has a history TDC catheter exchanged by vascular surgery Dr. Jeannetta on 05/18/2024. (per MD)     Clinical Impressions Pt agreeable to OT evaluation. Pt and family report assist for ADL's at baseline. Pt reportedly can ambulate using the RW in the house. Today the pt required max A with and without RW for sit to stand. Max A for stand pivot to chair without RW. L UE limited with mod to max tone, but pt was able to grasp the RW. Mod A needed for bed mobility with use of rail and HOB elevated. Pt left in the chair with chair alarm set and call bell within reach. Pt later assisted back to bed with same level of assist. Pt will benefit from continued OT in the hospital to increase strength, balance, and endurance for safe ADL's.        If plan is discharge home, recommend the following:   A lot of help with walking and/or transfers;A lot of help with bathing/dressing/bathroom;Assistance with cooking/housework;Assist for transportation;Help with stairs or ramp for entrance     Functional Status Assessment   Patient has had a recent decline in their functional status and demonstrates the ability to make significant improvements in function in a  reasonable and predictable amount of time.     Equipment Recommendations   None recommended by OT             Precautions/Restrictions   Precautions Precautions: Fall Recall of Precautions/Restrictions: Intact Restrictions Weight Bearing Restrictions Per Provider Order: No     Mobility Bed Mobility Overal bed mobility: Needs Assistance Bed Mobility: Supine to Sit     Supine to sit: Mod A HOB elevated, Used rails     General bed mobility comments: Increased time and labored movement. Assist to move trunk to upright position.    Transfers Overall transfer level: Needs assistance Equipment used: Rolling walker (2 wheels) Transfers: Bed to chair/wheelchair/BSC, Sit to/from Stand Sit to Stand: Max assist Stand pivot transfers: Max assist         General transfer comment: Stand from EOB with RW while NT completed peri care with max A. RW not used for EOB to chair due to pt's poor balance and posterior lean. Physical assist from OT only with less posterior lean noted.      Balance Overall balance assessment: Needs assistance Sitting-balance support: Feet supported, No upper extremity supported Sitting balance-Leahy Scale: Fair Sitting balance - Comments: seated at EOB   Standing balance support: Reliant on assistive device for balance, During functional activity, Bilateral upper extremity supported Standing balance-Leahy Scale: Poor Standing balance comment: using RW with significant posterior lean; Poor without RW as well with less posterior lean.  ADL either performed or assessed with clinical judgement   ADL Overall ADL's : Needs assistance/impaired Eating/Feeding: Sitting;Minimal assistance;Set up   Grooming: Minimal assistance;Sitting;Set up   Upper Body Bathing: Minimal assistance;Sitting;Set up   Lower Body Bathing: Maximal assistance;Total assistance;Sitting/lateral leans   Upper Body Dressing : Set up;Minimal  assistance;Sitting   Lower Body Dressing: Maximal assistance;Total assistance;Sitting/lateral leans   Toilet Transfer: Maximal assistance;Rolling walker (2 wheels);Stand-pivot Statistician Details (indicate cue type and reason): EOB to chair without RW and sit to stand with RW. Toileting- Clothing Manipulation and Hygiene: Total assistance;Sit to/from stand;Maximal assistance Toileting - Clothing Manipulation Details (indicate cue type and reason): NT assisted while pt stood with OT.             Vision Baseline Vision/History: 0 No visual deficits Ability to See in Adequate Light: 0 Adequate Patient Visual Report: No change from baseline Vision Assessment?: No apparent visual deficits     Perception Perception: Not tested       Praxis Praxis: Not tested       Pertinent Vitals/Pain Pain Assessment Pain Assessment: No/denies pain     Extremity/Trunk Assessment Upper Extremity Assessment Upper Extremity Assessment: RUE deficits/detail;LUE deficits/detail RUE Deficits / Details: WFL LUE Deficits / Details: Moderate to severe tone in elbow and shoulder; limited at baseline from previous stroke. Able to grasp RW with assist for placement. Much tone in hand as well.   Lower Extremity Assessment Lower Extremity Assessment: Defer to PT evaluation   Cervical / Trunk Assessment Cervical / Trunk Assessment: Kyphotic   Communication Communication Communication: Impaired Factors Affecting Communication: Difficulty expressing self;Other (comment) (Often shaking head rather than verbally communicating. Did verbally communicate some to start the session.)   Cognition Arousal: Alert Behavior During Therapy: [facility] for tasks assessed/performed Cognition: No apparent impairments                               Following commands: Intact       Cueing  General Comments   Cueing Techniques: Verbal cues;Tactile cues                 Home Living Family/patient  expects to be discharged to:: Private residence Living Arrangements: Spouse/significant other;Other relatives Available Help at Discharge: Family;Available 24 hours/day Type of Home: House Home Access: Ramped entrance     Home Layout: One level     Bathroom Shower/Tub: Producer, Television/film/video: Standard Bathroom Accessibility: Yes   Home Equipment: Agricultural Consultant (2 wheels);Wheelchair - manual;Shower seat;Grab bars - tub/shower;BSC/3in1;Hospital bed          Prior Functioning/Environment Prior Level of Function : Needs assist       Physical Assist : Mobility (physical);ADLs (physical)   ADLs (physical): IADLs;Toileting;Bathing;Dressing;Grooming;Feeding Mobility Comments: short household distances using RW ADLs Comments: Has daily PCA and family assist.    OT Problem List: Decreased strength;Decreased range of motion;Decreased activity tolerance;Impaired balance (sitting and/or standing)   OT Treatment/Interventions: Self-care/ADL training;Therapeutic exercise;Therapeutic activities;Patient/family education;Balance training      OT Goals(Current goals can be found in the care plan section)   Acute Rehab OT Goals Patient Stated Goal: Return home OT Goal Formulation: With patient/family Time For Goal Achievement: 06/15/24 Potential to Achieve Goals: Fair   OT Frequency:  Min 2X/week  End of Session Equipment Utilized During Treatment: Rolling walker (2 wheels);Gait belt Nurse Communication: Mobility status  Activity Tolerance: Patient tolerated treatment well Patient left: in chair;with call bell/phone within reach;with chair alarm set  OT Visit Diagnosis: Unsteadiness on feet (R26.81);Other abnormalities of gait and mobility (R26.89);Muscle weakness (generalized) (M62.81)                Time: 8497-8468 OT Time Calculation (min): 29 min Charges:  OT General Charges $OT Visit: 1 Visit OT Evaluation $OT  Eval Low Complexity: 1 Low  Dazhane Villagomez OT, MOT  Jayson Person 06/01/2024, 3:49 PM

## 2024-06-01 NOTE — Progress Notes (Signed)
 Patient has pulled dressing off of HD catheter twice this shift and has been replaced twice.

## 2024-06-01 NOTE — Progress Notes (Signed)
 [facility] Progress Note    Assessment/ Plan:   Proteus bacteremia -per primary service, source? -may need line holiday? Will determine this after HD tomorrow. Let us  know  ESRD -TDC recently exchanged by Dr. Magda on 05/18/24 -maintain HD on TTS schedule, HD tomorrow  HTN/Volume -UF as tolerated with HD -c/w home BP meds  Anemia -not due for ESA. Hgb 9 -transfuse prn for Hgb <7 -avoiding IV Fe since on abx  Secondary hyperparathyroidism -monitor phos -recommend renal diet -on renvela  Paraplegia -supportive care per primary   Outpatient Dialysis Orders: Center:  [facility]  on TTS . EDW 87.5 kg HD Bath 2K/2.5Ca  Time 4:00 Heparin  1000 units bolus then 1200 units/hr. Access RIJ TDC BFR 400 DFR 500    Zemplar 3 mcg po with HD three times per week (was not showing up on MAR) Venofer  50 mg IV once a week Micera 30 mg IV q2 weeks (last given on 10/21) Sensipar 30 mg three times per week  Discussed with RN at the bedside.  Subjective:   Patient seen and examined bedside. No complaints/acute events   Objective:   BP (!) 168/110 (BP Location: Left Arm)   Pulse 85   Temp 97.8 F (36.6 C) (Oral)   Resp 20   Ht 6' 2 (1.88 m)   Wt 84.5 kg   SpO2 94%   BMI 23.92 kg/m   Intake/Output Summary (Last 24 hours) at 06/01/2024 9178 Last data filed at 05/31/2024 1953 Gross per 24 hour  Intake 840 ml  Output --  Net 840 ml   Weight change:   Physical Exam: Gen: chronically ill appearing CVS: RRR Resp: CTA BL Abd: soft, nt/nd Ext: no sig edema Neuro: awake, paraplegia Dialysis access: RIJ [facility] c/d/i  Imaging: CT ABDOMEN PELVIS W CONTRAST Result Date: 05/31/2024 EXAM: CT ABDOMEN AND PELVIS WITH CONTRAST 05/31/2024 07:01:34 PM TECHNIQUE: CT of the abdomen and pelvis was performed with the administration of 100 mL of iohexol  (OMNIPAQUE ) 300 MG/ML solution. Multiplanar reformatted images are provided for review. Automated exposure control,  iterative reconstruction, and/or weight-based adjustment of the mA/kV was utilized to reduce the radiation dose to as low as reasonably achievable. COMPARISON: 05/21/2021 CLINICAL HISTORY: Sepsis; bacteremia. Sepsis, bacteremia, abnormal labs. FINDINGS: LOWER CHEST: Central venous catheter tip in superior right atrium. Cardiomegaly. Extensive multi-vessel coronary artery calcification. Mild bibasilar atelectasis. No acute abnormality. LIVER: The liver is unremarkable. GALLBLADDER AND BILE DUCTS: Gallbladder is unremarkable. No biliary ductal dilatation. SPLEEN: No acute abnormality. PANCREAS: No acute abnormality. ADRENAL GLANDS: No acute abnormality. KIDNEYS, URETERS AND BLADDER: Innumerable simple and complex cysts bilaterally consistent with polycystic kidney disease. Dominant 6.0 cm simple cyst in the right lower kidney (image 48). Dominant 4.0 cm hemorrhagic lesion in the lateral left upper kidney (image 34) likely reflecting a benign hemorrhagic cyst, but a solid mass is not excluded on an enhanced CT. Suspected vascular calcification at the left renal hilum (image 37), chronic. No stones in the kidneys or ureters. No hydronephrosis. No perinephric or periureteral stranding. Urinary bladder is unremarkable. GI AND BOWEL: Stomach demonstrates no acute abnormality. Moderate rectal stool burden, suggesting fecal impaction, although without superimposed inflammatory changes to suggest stercoral colitis. There is no bowel obstruction. PERITONEUM AND RETROPERITONEUM: No ascites. No free air. VASCULATURE: Aorta is normal in caliber. Aortic atherosclerosis. LYMPH NODES: No lymphadenopathy. REPRODUCTIVE ORGANS: The prostate is unremarkable. BONES AND SOFT TISSUES: No acute osseous abnormality. No focal soft tissue abnormality. IMPRESSION: 1. Moderate rectal stool burden,  suggesting fecal impaction, without stercoral colitis. 2. Otherwise, no acute findings. 3. Polycystic kidney disease. Dominent 4.0 cm hyperdense  lesion in the left upper kidney, possibly a benign hemorrhagic cyst, but indeterminate. Electronically signed by: Pinkie Pebbles MD 05/31/2024 07:36 PM EDT RP Workstation: [id number]    Labs: BMET Recent Labs  Lab 05/28/24 1547 05/29/24 0448 06/01/24 0506  NA 137 136 133*  K 3.3* 3.7 4.2  CL 95* 95* 93*  CO2 29 28 21*  GLUCOSE 87 86 81  BUN 18 26* 63*  CREATININE 4.88* 6.85* 13.20*  CALCIUM  9.0 9.1 9.0  PHOS  --   --  6.7*   CBC Recent Labs  Lab 05/28/24 1547 05/29/24 0448 06/01/24 0506  WBC 7.2 7.8 7.4  NEUTROABS 5.2  --   --   HGB 10.4* 10.2* 9.0*  HCT 31.7* 31.4* 26.8*  MCV 90.6 92.4 87.6  PLT 238 222 301    Medications:     amLODipine   5 mg Oral Daily   Chlorhexidine  Gluconate Cloth  6 each Topical Daily   Chlorhexidine  Gluconate Cloth  6 each Topical Q0600   cloNIDine   0.2 mg Transdermal Weekly   FLUoxetine   10 mg Oral Daily   furosemide   40 mg Oral Daily   heparin   5,000 Units Subcutaneous Q8H   hydrALAZINE   50 mg Oral TID   levETIRAcetam   500 mg Oral BID   losartan   50 mg Oral Daily   metoprolol  tartrate  25 mg Oral Daily   pantoprazole   40 mg Oral Daily   sevelamer carbonate  1,600 mg Oral TID WC   sodium chloride  flush  10-40 mL Intracatheter Q12H      Ephriam Stank, MD [facility] 06/01/2024, 8:21 AM

## 2024-06-01 NOTE — Progress Notes (Signed)
 Discharge instructions reviewed with patient's sister/caregiver. Sister verbalized understanding of instructions. Patient discharged home with sister in stable condition.

## 2024-06-01 NOTE — Progress Notes (Signed)
 Contacted by MD regarding possible d/c. Contacted out-pt HD clinic, [facility] to inform pt is now on Ceftazidime 2000 mg IV every T-Th-Sat w/ HD thru the end of 11/6. Clinic stated they do have these abx available and can give tomorrow with tx if d/c today. MD and nephrology made aware at this time. Will send d/c summ and last note when available.   Lavanda Basma Buchner Dialysis Navigator [phone]

## 2024-06-02 ENCOUNTER — Emergency Department (HOSPITAL_COMMUNITY)

## 2024-06-02 ENCOUNTER — Other Ambulatory Visit: Payer: Self-pay

## 2024-06-02 ENCOUNTER — Emergency Department (HOSPITAL_COMMUNITY)
Admission: EM | Admit: 2024-06-02 | Discharge: 2024-06-02 | Disposition: A | Discharge status: Home / Self Care | Source: Other Acute Inpatient Hospital | Attending: Emergency Medicine | Admitting: Emergency Medicine

## 2024-06-02 DIAGNOSIS — R531 Weakness: Secondary | ICD-10-CM | POA: Diagnosis present

## 2024-06-02 DIAGNOSIS — Z992 Dependence on renal dialysis: Secondary | ICD-10-CM | POA: Insufficient documentation

## 2024-06-02 DIAGNOSIS — I120 Hypertensive chronic kidney disease with stage 5 chronic kidney disease or end stage renal disease: Secondary | ICD-10-CM | POA: Insufficient documentation

## 2024-06-02 DIAGNOSIS — N186 End stage renal disease: Secondary | ICD-10-CM | POA: Diagnosis not present

## 2024-06-02 DIAGNOSIS — K5900 Constipation, unspecified: Secondary | ICD-10-CM | POA: Insufficient documentation

## 2024-06-02 DIAGNOSIS — Z79899 Other long term (current) drug therapy: Secondary | ICD-10-CM | POA: Insufficient documentation

## 2024-06-02 LAB — COMPREHENSIVE METABOLIC PANEL WITH GFR
ALT: 13 U/L (ref 0–44)
AST: 24 U/L (ref 15–41)
Albumin: 3.8 g/dL (ref 3.5–5.0)
Alkaline Phosphatase: 60 U/L (ref 38–126)
Anion gap: 21 — ABNORMAL HIGH (ref 5–15)
BUN: 76 mg/dL — ABNORMAL HIGH (ref 8–23)
CO2: 20 mmol/L — ABNORMAL LOW (ref 22–32)
Calcium: 9.4 mg/dL (ref 8.9–10.3)
Chloride: 95 mmol/L — ABNORMAL LOW (ref 98–111)
Creatinine, Ser: 15.3 mg/dL — ABNORMAL HIGH (ref 0.61–1.24)
GFR, Estimated: 3 mL/min — ABNORMAL LOW (ref >60)
Glucose, Bld: 100 mg/dL — ABNORMAL HIGH (ref 70–99)
Potassium: 4.6 mmol/L (ref 3.5–5.1)
Sodium: 136 mmol/L (ref 135–145)
Total Bilirubin: 0.4 mg/dL (ref 0.0–1.2)
Total Protein: 7.7 g/dL (ref 6.5–8.1)

## 2024-06-02 LAB — LIPASE, BLOOD: Lipase: 145 U/L — ABNORMAL HIGH (ref 11–51)

## 2024-06-02 LAB — CBC WITH DIFFERENTIAL/PLATELET
Abs Immature Granulocytes: 0.09 K/uL — ABNORMAL HIGH (ref 0.00–0.07)
Basophils Absolute: 0 K/uL (ref 0.0–0.1)
Basophils Relative: 1 %
Eosinophils Absolute: 0.2 K/uL (ref 0.0–0.5)
Eosinophils Relative: 2 %
HCT: 28.8 % — ABNORMAL LOW (ref 39.0–52.0)
Hemoglobin: 9.8 g/dL — ABNORMAL LOW (ref 13.0–17.0)
Immature Granulocytes: 1 %
Lymphocytes Relative: 21 %
Lymphs Abs: 1.5 K/uL (ref 0.7–4.0)
MCH: 30.2 pg (ref 26.0–34.0)
MCHC: 34 g/dL (ref 30.0–36.0)
MCV: 88.9 fL (ref 80.0–100.0)
Monocytes Absolute: 0.8 K/uL (ref 0.1–1.0)
Monocytes Relative: 11 %
Neutro Abs: 4.7 K/uL (ref 1.7–7.7)
Neutrophils Relative %: 64 %
Platelets: 340 K/uL (ref 150–400)
RBC: 3.24 MIL/uL — ABNORMAL LOW (ref 4.22–5.81)
RDW: 13.8 % (ref 11.5–15.5)
WBC: 7.3 K/uL (ref 4.0–10.5)
nRBC: 0 % (ref 0.0–0.2)

## 2024-06-02 LAB — CULTURE, BLOOD (ROUTINE X 2)
Culture: NO GROWTH
Culture: NO GROWTH
Special Requests: ADEQUATE

## 2024-06-02 LAB — HEPATITIS B SURFACE ANTIGEN: Hepatitis B Surface Ag: NONREACTIVE

## 2024-06-02 LAB — TROPONIN T, HIGH SENSITIVITY
Troponin T High Sensitivity: 119 ng/L (ref 0–19)
Troponin T High Sensitivity: 118 ng/L (ref 0–19)

## 2024-06-02 LAB — PRO BRAIN NATRIURETIC PEPTIDE: Pro Brain Natriuretic Peptide: 10565 pg/mL — ABNORMAL HIGH (ref <300.0)

## 2024-06-02 MED ORDER — PENTAFLUOROPROP-TETRAFLUOROETH EX AERO: 1.0000 | INHALATION_SPRAY | CUTANEOUS | Status: DC | PRN

## 2024-06-02 MED ORDER — ANTICOAGULANT SODIUM CITRATE 4% (200MG/5ML) IV SOLN: 5.0000 mL | Status: DC | PRN

## 2024-06-02 MED ORDER — HEPARIN SODIUM (PORCINE) 1000 UNIT/ML IJ SOLN
INTRAMUSCULAR | Status: AC
  Filled 2024-06-02: qty 4

## 2024-06-02 MED ORDER — LEVETIRACETAM (KEPPRA) 500 MG/5 ML ADULT IV PUSH
500.0000 mg | Freq: Once | INTRAVENOUS | Status: AC
  Administered 2024-06-02: 500 mg via INTRAVENOUS
  Filled 2024-06-02: qty 5

## 2024-06-02 MED ORDER — LIDOCAINE HCL (PF) 1 % IJ SOLN: 5.0000 mL | INTRAMUSCULAR | Status: DC | PRN

## 2024-06-02 MED ORDER — LIDOCAINE-PRILOCAINE 2.5-2.5 % EX CREA: 1.0000 | TOPICAL_CREAM | CUTANEOUS | Status: DC | PRN

## 2024-06-02 MED ORDER — CHLORHEXIDINE GLUCONATE CLOTH 2 % EX PADS: 6.0000 | MEDICATED_PAD | Freq: Every day | CUTANEOUS | Status: DC

## 2024-06-02 MED ORDER — ALTEPLASE 2 MG IJ SOLR: 2.0000 mg | Freq: Once | INTRAMUSCULAR | Status: DC | PRN

## 2024-06-02 MED ORDER — HEPARIN SODIUM (PORCINE) 1000 UNIT/ML DIALYSIS
20.0000 [IU]/kg | INTRAMUSCULAR | Status: DC | PRN
  Administered 2024-06-02: 1700 [IU] via INTRAVENOUS_CENTRAL

## 2024-06-02 MED ORDER — ONDANSETRON HCL 4 MG/2ML IJ SOLN
4.0000 mg | Freq: Once | INTRAMUSCULAR | Status: AC
  Administered 2024-06-02: 4 mg via INTRAVENOUS
  Filled 2024-06-02: qty 2

## 2024-06-02 MED ORDER — NEPRO/CARBSTEADY PO LIQD
237.0000 mL | ORAL | Status: DC | PRN
  Filled 2024-06-02: qty 237

## 2024-06-02 MED ORDER — HEPARIN SODIUM (PORCINE) 1000 UNIT/ML IJ SOLN
INTRAMUSCULAR | Status: AC
  Filled 2024-06-02: qty 2

## 2024-06-02 MED ORDER — HEPARIN SODIUM (PORCINE) 1000 UNIT/ML DIALYSIS
1000.0000 [IU] | INTRAMUSCULAR | Status: DC | PRN
  Administered 2024-06-02: 3800 [IU]

## 2024-06-02 NOTE — ED Provider Notes (Signed)
 [place] EMERGENCY DEPARTMENT AT [facility] Provider Note   CSN: [id number] Arrival date & time: 06/02/24  9163     Patient presents with: Weakness   Bryce Miller is a 63 y.o. male.   HPI Patient presents for just not feeling good.  Medical history includes ESRD, HTN, prediabetes, CVA, seizures.  He was admitted to the hospital 5 days ago for bacteremia.  Plan per infectious disease was 2 weeks of Fortaz.  This is planned to be given during dialysis sessions, Tuesday, Thursday, Saturday.  During OT evaluation yesterday, he required maximum assist to sit and stand.  At baseline, he ambulates with a rolling walker.  He was discharged yesterday.  He arrives today via EMS.  EMS reports generalized weakness and feeling unwell.  Patient is unable to describe his symptoms other than not feeling well.  He does answer yes to nausea.  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  He denies any areas of pain.  He is unable to provide any further history.    Prior to Admission medications   Medication Sig Start Date End Date Taking? Authorizing Provider  amLODipine  (NORVASC ) 5 MG tablet Take 5 mg by mouth daily. 08/15/20   [provider]  cloNIDine  (CATAPRES  - DOSED IN MG/24 HR) 0.2 mg/24hr patch Place 1 patch (0.2 mg total) onto the skin once a week. 02/06/24   Ricky Fines, MD  FLUoxetine  (PROZAC ) 10 MG capsule Take 10 mg by mouth daily. 08/15/20   [provider]  furosemide  (LASIX ) 40 MG tablet Take 40 mg by mouth daily.     [provider]  hydrALAZINE  (APRESOLINE ) 50 MG tablet Take 50 mg by mouth 3 (three) times daily. 05/28/24   [provider]  levETIRAcetam  (KEPPRA ) 500 MG tablet Take 500 mg by mouth 2 (two) times daily. 08/15/20   [provider]  losartan  (COZAAR ) 100 MG tablet Take 50 mg by mouth daily. 11/15/23   [provider]  metoprolol  tartrate (LOPRESSOR ) 25 MG tablet Take 25 mg by mouth daily.  05/23/24   [provider]  omeprazole (PRILOSEC) 20 MG capsule Take 20 mg by mouth daily. 05/23/24   [provider]  sevelamer carbonate (RENVELA) 800 MG tablet Take 800-1,600 mg by mouth See admin instructions. 1600 mg with meals, 800 mg with snacks    [provider]    Allergies: Patient has no known allergies.    Review of Systems  Neurological:  Positive for weakness.  All other systems reviewed and are negative.   Updated Vital Signs BP (!) 177/114 (BP Location: Left Arm)   Pulse (!) 104   Temp 98.5 F (36.9 C) (Oral)   Resp 17   SpO2 98%   Physical Exam Vitals and nursing note reviewed.  Constitutional:      General: He is not in acute distress.    Appearance: Normal appearance. He is well-developed. He is not ill-appearing, toxic-appearing or diaphoretic.  HENT:     Head: Normocephalic and atraumatic.     Right Ear: External ear normal.     Left Ear: External ear normal.     Nose: Nose normal.     Mouth/Throat:     Mouth: Mucous membranes are moist.  Eyes:     Extraocular Movements: Extraocular movements intact.     Conjunctiva/sclera: Conjunctivae normal.  Cardiovascular:     Rate and Rhythm: Normal rate and regular rhythm.  Pulmonary:     Effort: Pulmonary  effort is normal. No respiratory distress.     Breath sounds: No wheezing or rales.  Chest:     Chest wall: Tenderness present.  Abdominal:     General: There is no distension.     Palpations: Abdomen is soft.     Tenderness: There is abdominal tenderness. There is no guarding or rebound.  Musculoskeletal:        General: No swelling.     Cervical back: Normal range of motion and neck supple.  Skin:    General: Skin is warm and dry.     Coloration: Skin is not jaundiced or pale.  Neurological:     Mental Status: He is alert. Mental status is at baseline.     Motor: Weakness present.  Psychiatric:        Mood and Affect: Mood normal.        Behavior: Behavior normal.      (all labs ordered are listed, but only abnormal results are displayed) Labs Reviewed  COMPREHENSIVE METABOLIC PANEL WITH GFR - Abnormal; Notable for the following components:      Result Value   Chloride 95 (*)    CO2 20 (*)    Glucose, Bld 100 (*)    BUN 76 (*)    Creatinine, Ser 15.30 (*)    GFR, Estimated 3 (*)    Anion gap 21 (*)    All other components within normal limits  LIPASE, BLOOD - Abnormal; Notable for the following components:   Lipase 145 (*)    All other components within normal limits  CBC WITH DIFFERENTIAL/PLATELET - Abnormal; Notable for the following components:   RBC 3.24 (*)    Hemoglobin 9.8 (*)    HCT 28.8 (*)    Abs Immature Granulocytes 0.09 (*)    All other components within normal limits  PRO BRAIN NATRIURETIC PEPTIDE - Abnormal; Notable for the following components:   Pro Brain Natriuretic Peptide 10,565.0 (*)    All other components within normal limits  TROPONIN T, HIGH SENSITIVITY - Abnormal; Notable for the following components:   Troponin T High Sensitivity 119 (*)    All other components within normal limits  CULTURE, BLOOD (SINGLE)  URINALYSIS, ROUTINE W REFLEX MICROSCOPIC  HEPATITIS B SURFACE ANTIGEN  HEPATITIS B SURFACE ANTIBODY, QUANTITATIVE  TROPONIN T, HIGH SENSITIVITY    EKG: EKG Interpretation Date/Time:  Tuesday June 02 2024 09:06:40 EDT Ventricular Rate:  95 PR Interval:  213 QRS Duration:  92 QT Interval:  384 QTC Calculation: 483 R Axis:   -65  Text Interpretation: Sinus rhythm Borderline prolonged PR interval Left anterior fascicular block Nonspecific T abnormalities, lateral leads Borderline prolonged QT interval Confirmed by Melvenia Motto ([id number]) on 06/02/2024 10:42:01 AM  Radiology: CT CHEST ABDOMEN PELVIS WO CONTRAST Result Date: 06/02/2024 EXAM: CT CHEST, ABDOMEN AND PELVIS WITHOUT CONTRAST 06/02/2024 10:59:55 AM TECHNIQUE: CT of the chest, abdomen and pelvis was performed without the administration of  intravenous contrast. Multiplanar reformatted images are provided for review. Automated exposure control, iterative reconstruction, and/or weight based adjustment of the mA/kV was utilized to reduce the radiation dose to as low as reasonably achievable. COMPARISON: Prior study dated 05/31/2024. CLINICAL HISTORY: Generalized weakness and feeling unwell. No dialysis yesterday or today. FINDINGS: CHEST: MEDIASTINUM AND LYMPH NODES: Right IJ dialysis catheter tip in the right atrium. Mild cardiomegaly. Thoracic aortic, coronary artery, and branch vessel atheromatous vascular calcifications. The central airways are clear. No mediastinal, hilar or axillary lymphadenopathy. 1.4 cm hypodense left thyroid nodule  on image 7 series 4. No further imaging workup is indicated based on size. LUNGS AND PLEURA: Airway thickening observed primarily in both lower lobes with mild airway plugging in the lower lobes. Mild atelectasis in both lower lobes. No focal consolidation or pulmonary edema. No pleural effusion or pneumothorax. ABDOMEN AND PELVIS: LIVER: The liver is unremarkable. GALLBLADDER AND BILE DUCTS: High density in the gallbladder probably from vicarious excretion of contrast from the 05/31/2024 exam. No biliary ductal dilatation. SPLEEN: No acute abnormality. PANCREAS: No acute abnormality. ADRENAL GLANDS: No acute abnormality. KIDNEYS, URETERS AND BLADDER: Polycystic kidney disease observed. Vascular calcification in the left renal hilum as before. No stones in the kidneys or ureters. No hydronephrosis. No perinephric or periureteral stranding. Urinary bladder is unremarkable. GI AND BOWEL: Stomach demonstrates no acute abnormality. Prominent rectal stool bowl without compelling findings of stercoral colitis. This is similar to the 05/31/2024 exam. Fecal impaction not excluded. REPRODUCTIVE ORGANS: No acute abnormality. PERITONEUM AND RETROPERITONEUM: No ascites. No free air. VASCULATURE: Aorta is normal in caliber.  Systemic atherosclerosis is present, including the aorta and iliac arteries. ABDOMINAL AND PELVIS LYMPH NODES: No lymphadenopathy. BONES AND SOFT TISSUES: Moderate degenerative hip arthropathy bilaterally. Fused SI joints bilaterally. IMPRESSION: 1. Right IJ dialysis catheter with tip in the right atrium. 2. Mild cardiomegaly. 3. Airway inflammation with mild lower-lobe mucus plugging and mild bibasilar atelectasis. 4. Polycystic kidney disease. 5. Prominent rectal stool burden; fecal impaction not excluded. 6. Systemic atherosclerosis involving thoracic aorta, coronary arteries, and branch vessels. 7. 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 Electronically signed by: [place] Hanlon Salvage MD 06/02/2024 11:55 AM EDT RP Workstation: [id number]   CT Head Wo Contrast Result Date: 06/02/2024 CLINICAL DATA:  Generalized weakness. EXAM: CT HEAD WITHOUT CONTRAST TECHNIQUE: Contiguous axial images were obtained from the base of the skull through the vertex without intravenous contrast. RADIATION DOSE REDUCTION: This exam was performed according to the departmental dose-optimization program which includes automated exposure control, adjustment of the mA and/or kV according to patient size and/or use of iterative reconstruction technique. COMPARISON:  May 28, 2023 FINDINGS: Brain: There is generalized cerebral atrophy with widening of the extra-axial spaces and ventricular dilatation. There are stable, extensive areas of decreased attenuation within the white matter tracts of the supratentorial brain, consistent with microvascular disease changes. A chronic right thalamic infarct is noted. Vascular: Moderate to marked severity bilateral cavernous carotid artery calcification is noted. Skull: Normal. Negative for fracture or focal lesion. Sinuses/Orbits: No acute finding. Other: None. IMPRESSION: 1. Generalized cerebral atrophy with widening of the extra-axial spaces and ventricular dilatation. 2.  Stable, extensive areas of decreased attenuation within the white matter tracts of the supratentorial brain, consistent with microvascular disease changes. 3. No acute intracranial abnormality. Electronically Signed   By: Suzen Dials M.D.   On: 06/02/2024 11:25   CT Cervical Spine Wo Contrast Result Date: 06/02/2024 CLINICAL DATA:  Neck pain, acute, no red flags Patient arrived by EMS from dialysis and complains of generalized weakness and feeling unwell. EXAM: CT CERVICAL SPINE WITHOUT CONTRAST TECHNIQUE: Multidetector CT imaging of the cervical spine was performed without intravenous contrast. Multiplanar CT image reconstructions were also generated. RADIATION DOSE REDUCTION: This exam was performed according to the departmental dose-optimization program which includes automated exposure control, adjustment of the mA and/or kV according to patient size and/or use of iterative reconstruction technique. COMPARISON:  None Available. FINDINGS: Alignment: Normal. Skull base and vertebrae: No evidence of acute fracture or traumatic subluxation. No evidence of discitis or  osteomyelitis. Soft tissues and spinal canal: No prevertebral fluid or swelling. No visible canal hematoma. Disc levels: Mild spondylosis with disc space narrowing and uncinate spurring greatest at C3-4, C5-6 and C6-7. Up to mild spinal stenosis and mild foraminal narrowing at multiple levels. No large disc herniation or high-grade spinal stenosis identified. Upper chest: Chest findings are dictated separately. There is a 1.7 cm low-density left thyroid nodule. Right IJ hemodialysis catheter in place. Other: Bilateral carotid atherosclerosis. IMPRESSION: 1. No evidence of acute cervical spine fracture, traumatic subluxation or static signs of instability. 2. Mild cervical spondylosis as described. 3. Incidental left thyroid nodule measuring 1.7 cm. Recommend non-emergent thyroid ultrasound. Reference: J Am Coll Radiol. 2015 Feb;12(2): 143-50  Electronically Signed   By: Elsie Perone M.D.   On: 06/02/2024 11:10   CT ABDOMEN PELVIS W CONTRAST Result Date: 05/31/2024 EXAM: CT ABDOMEN AND PELVIS WITH CONTRAST 05/31/2024 07:01:34 PM TECHNIQUE: CT of the abdomen and pelvis was performed with the administration of 100 mL of iohexol  (OMNIPAQUE ) 300 MG/ML solution. Multiplanar reformatted images are provided for review. Automated exposure control, iterative reconstruction, and/or weight-based adjustment of the mA/kV was utilized to reduce the radiation dose to as low as reasonably achievable. COMPARISON: 05/21/2021 CLINICAL HISTORY: Sepsis; bacteremia. Sepsis, bacteremia, abnormal labs. FINDINGS: LOWER CHEST: Central venous catheter tip in superior right atrium. Cardiomegaly. Extensive multi-vessel coronary artery calcification. Mild bibasilar atelectasis. No acute abnormality. LIVER: The liver is unremarkable. GALLBLADDER AND BILE DUCTS: Gallbladder is unremarkable. No biliary ductal dilatation. SPLEEN: No acute abnormality. PANCREAS: No acute abnormality. ADRENAL GLANDS: No acute abnormality. KIDNEYS, URETERS AND BLADDER: Innumerable simple and complex cysts bilaterally consistent with polycystic kidney disease. Dominant 6.0 cm simple cyst in the right lower kidney (image 48). Dominant 4.0 cm hemorrhagic lesion in the lateral left upper kidney (image 34) likely reflecting a benign hemorrhagic cyst, but a solid mass is not excluded on an enhanced CT. Suspected vascular calcification at the left renal hilum (image 37), chronic. No stones in the kidneys or ureters. No hydronephrosis. No perinephric or periureteral stranding. Urinary bladder is unremarkable. GI AND BOWEL: Stomach demonstrates no acute abnormality. Moderate rectal stool burden, suggesting fecal impaction, although without superimposed inflammatory changes to suggest stercoral colitis. There is no bowel obstruction. PERITONEUM AND RETROPERITONEUM: No ascites. No free air. VASCULATURE: Aorta is  normal in caliber. Aortic atherosclerosis. LYMPH NODES: No lymphadenopathy. REPRODUCTIVE ORGANS: The prostate is unremarkable. BONES AND SOFT TISSUES: No acute osseous abnormality. No focal soft tissue abnormality. IMPRESSION: 1. Moderate rectal stool burden, suggesting fecal impaction, without stercoral colitis. 2. Otherwise, no acute findings. 3. Polycystic kidney disease. Dominent 4.0 cm hyperdense lesion in the left upper kidney, possibly a benign hemorrhagic cyst, but indeterminate. Electronically signed by: Pinkie Pebbles MD 05/31/2024 07:36 PM EDT RP Workstation: [id number]     Procedures   Medications Ordered in the ED  Chlorhexidine  Gluconate Cloth 2 % PADS 6 each (has no administration in time range)  ondansetron  (ZOFRAN ) injection 4 mg (4 mg Intravenous Given 06/02/24 1003)  levETIRAcetam  (KEPPRA ) undiluted injection 500 mg (500 mg Intravenous Given 06/02/24 1003)                                    Medical Decision Making Amount and/or Complexity of Data Reviewed Labs: ordered. Radiology: ordered.  Risk Prescription drug management.   This patient presents to the ED for concern of I do not feel good, this involves an  extensive number of treatment options, and is a complaint that carries with it a high risk of complications and morbidity.  The differential diagnosis includes deconditioning, dehydration, polypharmacy, metabolic derangements, infection   Co morbidities / Chronic conditions that complicate the patient evaluation   ESRD, HTN, prediabetes, CVA, seizures   Additional history obtained:  Additional history obtained from EMR External records from outside source obtained and reviewed including patient's sister   Lab Tests:  I Ordered, and personally interpreted labs.  The pertinent results include: Elevations in troponin and BNP concerning for volume overload; elevated creatinine and BUN consistent with ESRD, baseline anemia, no leukocytosis, normal  electrolytes   Imaging Studies ordered:  I ordered imaging studies including CT of head, cervical spine, chest, abdomen, pelvis I independently visualized and interpreted imaging which showed prominent rectal stool burden, urinary inflammation, no other acute findings. I agree with the radiologist interpretation   Cardiac Monitoring: / EKG:  The patient was maintained on a cardiac monitor.  I personally viewed and interpreted the cardiac monitored which showed an underlying rhythm of: Sinus rhythm   Problem List / ED Course / Critical interventions / Medication management  Patient presenting via EMS for report of feeling unwell.  When speaking with the patient, he simply repeats I am just not feeling good.  When asked specific questions, he can answer yes and no.  He denies any acute pain.  He does endorse nausea.  When palpating his chest and abdomen, he does endorse tenderness.  He has rigidity of his left hemibody which he states is baseline for him.  Workup was initiated.  I spoke with his sister, who the patient lives with.  She confirms that his left hemibody deficit and speech limitations are baseline.  She also states that he seemed to be fine this morning.  He ate half a sandwich for breakfast.  He went to dialysis.  While in the ED, patient was taken to dialysis.  Care of patient was signed to oncoming ED provider. I ordered medication including Zofran  for nausea, Keppra  for seizure prophylaxis Reevaluation of the patient after these medicines showed that the patient stayed the same I have reviewed the patients home medicines and have made adjustments as needed  Social Determinants of Health:  Lives at home with sister     Final diagnoses:  Constipation, unspecified constipation type    ED Discharge Orders     None          Melvenia Motto, MD 06/02/24 1629

## 2024-06-02 NOTE — Progress Notes (Signed)
 Patient ID: Bryce Miller, male   DOB: 06-07-1961, 63 y.o.   MRN: [id number] S: Bryce Miller was discharged yesterday and went to outpatient HD but refused to start treatment because he was not feeling well and transported to [facility] ED this morning.  In the ED, AFVSS.  Complaining of generalized weakness and sent to CT scan of head and cervical spine which was unrevealing.  We were re-consulted to provide dialysis during his admission.  Please see consult note dated 05/29/24 for details from prior admission.  He is currently not able to identify what is wrong with him and is minimally conversant although awake and alert. O:BP (!) 174/121   Pulse 95   Temp 97.8 F (36.6 C) (Oral)   Resp 15   SpO2 97%  No intake or output data in the 24 hours ending 06/02/24 1135 Intake/Output: No intake/output data recorded.  Intake/Output this shift:  No intake/output data recorded. Weight change:  Gen: NAD CVS: RRR Resp:CTA Abd: + tenderness to palpation, +BS, + guarding Ext: no edema  Recent Labs  Lab 05/28/24 1547 05/29/24 0448 06/01/24 0506 06/02/24 0927  NA 137 136 133* 136  K 3.3* 3.7 4.2 4.6  CL 95* 95* 93* 95*  CO2 29 28 21* 20*  GLUCOSE 87 86 81 100*  BUN 18 26* 63* 76*  CREATININE 4.88* 6.85* 13.20* 15.30*  ALBUMIN 4.0  --  3.5 3.8  CALCIUM  9.0 9.1 9.0 9.4  PHOS  --   --  6.7*  --   AST 24  --   --  24  ALT 17  --   --  13   Liver Function Tests: Recent Labs  Lab 05/28/24 1547 06/01/24 0506 06/02/24 0927  AST 24  --  24  ALT 17  --  13  ALKPHOS 54  --  60  BILITOT 0.5  --  0.4  PROT 8.2*  --  7.7  ALBUMIN 4.0 3.5 3.8   Recent Labs  Lab 06/02/24 0927  LIPASE 145*   No results for input(s): AMMONIA in the last 168 hours. CBC: Recent Labs  Lab 05/28/24 1547 05/29/24 0448 06/01/24 0506 06/02/24 0927  WBC 7.2 7.8 7.4 7.3  NEUTROABS 5.2  --   --  4.7  HGB 10.4* 10.2* 9.0* 9.8*  HCT 31.7* 31.4* 26.8* 28.8*  MCV 90.6 92.4 87.6 88.9  PLT 238 222 301 340   Cardiac  Enzymes: No results for input(s): CKTOTAL, CKMB, CKMBINDEX, TROPONINI in the last 168 hours. CBG: No results for input(s): GLUCAP in the last 168 hours.  Iron Studies: No results for input(s): IRON, TIBC, TRANSFERRIN, FERRITIN in the last 72 hours. Studies/Results: CT Head Wo Contrast Result Date: 06/02/2024 CLINICAL DATA:  Generalized weakness. EXAM: CT HEAD WITHOUT CONTRAST TECHNIQUE: Contiguous axial images were obtained from the base of the skull through the vertex without intravenous contrast. RADIATION DOSE REDUCTION: This exam was performed according to the departmental dose-optimization program which includes automated exposure control, adjustment of the mA and/or kV according to patient size and/or use of iterative reconstruction technique. COMPARISON:  May 28, 2023 FINDINGS: Brain: There is generalized cerebral atrophy with widening of the extra-axial spaces and ventricular dilatation. There are stable, extensive areas of decreased attenuation within the white matter tracts of the supratentorial brain, consistent with microvascular disease changes. A chronic right thalamic infarct is noted. Vascular: Moderate to marked severity bilateral cavernous carotid artery calcification is noted. Skull: Normal. Negative for fracture or focal lesion. Sinuses/Orbits: No acute finding.  Other: None. IMPRESSION: 1. Generalized cerebral atrophy with widening of the extra-axial spaces and ventricular dilatation. 2. Stable, extensive areas of decreased attenuation within the white matter tracts of the supratentorial brain, consistent with microvascular disease changes. 3. No acute intracranial abnormality. Electronically Signed   By: Suzen Dials M.D.   On: 06/02/2024 11:25   CT Cervical Spine Wo Contrast Result Date: 06/02/2024 CLINICAL DATA:  Neck pain, acute, no red flags Patient arrived by EMS from dialysis and complains of generalized weakness and feeling unwell. EXAM: CT CERVICAL  SPINE WITHOUT CONTRAST TECHNIQUE: Multidetector CT imaging of the cervical spine was performed without intravenous contrast. Multiplanar CT image reconstructions were also generated. RADIATION DOSE REDUCTION: This exam was performed according to the departmental dose-optimization program which includes automated exposure control, adjustment of the mA and/or kV according to patient size and/or use of iterative reconstruction technique. COMPARISON:  None Available. FINDINGS: Alignment: Normal. Skull base and vertebrae: No evidence of acute fracture or traumatic subluxation. No evidence of discitis or osteomyelitis. Soft tissues and spinal canal: No prevertebral fluid or swelling. No visible canal hematoma. Disc levels: Mild spondylosis with disc space narrowing and uncinate spurring greatest at C3-4, C5-6 and C6-7. Up to mild spinal stenosis and mild foraminal narrowing at multiple levels. No large disc herniation or high-grade spinal stenosis identified. Upper chest: Chest findings are dictated separately. There is a 1.7 cm low-density left thyroid nodule. Right IJ hemodialysis catheter in place. Other: Bilateral carotid atherosclerosis. IMPRESSION: 1. No evidence of acute cervical spine fracture, traumatic subluxation or static signs of instability. 2. Mild cervical spondylosis as described. 3. Incidental left thyroid nodule measuring 1.7 cm. Recommend non-emergent thyroid ultrasound. Reference: J Am Coll Radiol. 2015 Feb;12(2): 143-50 Electronically Signed   By: Elsie Perone M.D.   On: 06/02/2024 11:10   CT ABDOMEN PELVIS W CONTRAST Result Date: 05/31/2024 EXAM: CT ABDOMEN AND PELVIS WITH CONTRAST 05/31/2024 07:01:34 PM TECHNIQUE: CT of the abdomen and pelvis was performed with the administration of 100 mL of iohexol  (OMNIPAQUE ) 300 MG/ML solution. Multiplanar reformatted images are provided for review. Automated exposure control, iterative reconstruction, and/or weight-based adjustment of the mA/kV was  utilized to reduce the radiation dose to as low as reasonably achievable. COMPARISON: 05/21/2021 CLINICAL HISTORY: Sepsis; bacteremia. Sepsis, bacteremia, abnormal labs. FINDINGS: LOWER CHEST: Central venous catheter tip in superior right atrium. Cardiomegaly. Extensive multi-vessel coronary artery calcification. Mild bibasilar atelectasis. No acute abnormality. LIVER: The liver is unremarkable. GALLBLADDER AND BILE DUCTS: Gallbladder is unremarkable. No biliary ductal dilatation. SPLEEN: No acute abnormality. PANCREAS: No acute abnormality. ADRENAL GLANDS: No acute abnormality. KIDNEYS, URETERS AND BLADDER: Innumerable simple and complex cysts bilaterally consistent with polycystic kidney disease. Dominant 6.0 cm simple cyst in the right lower kidney (image 48). Dominant 4.0 cm hemorrhagic lesion in the lateral left upper kidney (image 34) likely reflecting a benign hemorrhagic cyst, but a solid mass is not excluded on an enhanced CT. Suspected vascular calcification at the left renal hilum (image 37), chronic. No stones in the kidneys or ureters. No hydronephrosis. No perinephric or periureteral stranding. Urinary bladder is unremarkable. GI AND BOWEL: Stomach demonstrates no acute abnormality. Moderate rectal stool burden, suggesting fecal impaction, although without superimposed inflammatory changes to suggest stercoral colitis. There is no bowel obstruction. PERITONEUM AND RETROPERITONEUM: No ascites. No free air. VASCULATURE: Aorta is normal in caliber. Aortic atherosclerosis. LYMPH NODES: No lymphadenopathy. REPRODUCTIVE ORGANS: The prostate is unremarkable. BONES AND SOFT TISSUES: No acute osseous abnormality. No focal soft tissue abnormality. IMPRESSION: 1.  Moderate rectal stool burden, suggesting fecal impaction, without stercoral colitis. 2. Otherwise, no acute findings. 3. Polycystic kidney disease. Dominent 4.0 cm hyperdense lesion in the left upper kidney, possibly a benign hemorrhagic cyst, but  indeterminate. Electronically signed by: Pinkie Pebbles MD 05/31/2024 07:36 PM EDT RP Workstation: [id number]    Chlorhexidine  Gluconate Cloth  6 each Topical Q0600    BMET    Component Value Date/Time   NA 136 06/02/2024 0927   K 4.6 06/02/2024 0927   CL 95 (L) 06/02/2024 0927   CO2 20 (L) 06/02/2024 0927   GLUCOSE 100 (H) 06/02/2024 0927   BUN 76 (H) 06/02/2024 0927   CREATININE 15.30 (H) 06/02/2024 0927   CALCIUM  9.4 06/02/2024 0927   GFRNONAA 3 (L) 06/02/2024 0927   GFRAA 11 (L) 05/24/2018 1916   CBC    Component Value Date/Time   WBC 7.3 06/02/2024 0927   RBC 3.24 (L) 06/02/2024 0927   HGB 9.8 (L) 06/02/2024 0927   HCT 28.8 (L) 06/02/2024 0927   PLT 340 06/02/2024 0927   MCV 88.9 06/02/2024 0927   MCH 30.2 06/02/2024 0927   MCHC 34.0 06/02/2024 0927   RDW 13.8 06/02/2024 0927   LYMPHSABS 1.5 06/02/2024 0927   MONOABS 0.8 06/02/2024 0927   EOSABS 0.2 06/02/2024 0927   BASOSABS 0.0 06/02/2024 0927   Outpatient Dialysis Orders: Center:  [facility]  on TTS . EDW 87.5 kg HD Bath 2K/2.5Ca  Time 4:00 Heparin  1000 units bolus then 1200 units/hr. Access RIJ TDC BFR 400 DFR 500    Zemplar 3 mcg po with HD three times per week (was not showing up on MAR) Venofer  50 mg IV once a week Micera 30 mg IV q2 weeks (last given on 10/21) Sensipar 30 mg three times per week  Assessment/ Plan:   Proteus bacteremia -per primary service, source? -blood cultures from 05/28/24 negative to date, will recheck blood cultures from HD catheter today with HD. -may need line holiday?   Generalized weakness and malaise -came back within 24 hours of discharge.  -CT of cervical spine and head unremarkable -CT scan of chest and abdomen pending. -will recheck blood cultures from HD catheter with HD today. -workup per primary svc  Abdominal Pain - initially denied any pain but was guarding upon exam and grimaces with palpation.   -CT of abdomen pending.    ESRD -TDC recently  exchanged by Dr. Magda on 05/18/24 -maintain HD on TTS schedule, HD today to keep on schedule.   HTN/Volume -UF as tolerated with HD -c/w home BP meds   Anemia -not due for ESA. Hgb 9 -transfuse prn for Hgb <7 -avoiding IV Fe since on abx   Secondary hyperparathyroidism -monitor phos -recommend renal diet -on renvela   Paraplegia following spinal cord CVA -supportive care per primary  Fairy RONAL Sellar, MD [facility]

## 2024-06-02 NOTE — ED Notes (Signed)
 Sister, Elveria called to pick up patient

## 2024-06-02 NOTE — ED Notes (Signed)
 Pt transferred to dialysis.

## 2024-06-02 NOTE — Progress Notes (Signed)
 Noted that pt back in ER, contacted out-pt HD clinic, [facility], they stated he did arrive at dialysis this morning, but stated he wanted to go to the hospital instead, and treatment was not started per pt request. Pt is TTS 0800 chair time. Will continue to assist as needed.   Lavanda Indy [place] Dialysis Navigator [phone]

## 2024-06-02 NOTE — ED Provider Notes (Signed)
 Signout from Dr. Melvenia.  63 year old male sent over from dialysis after showing up there with not feeling good.  Did not receive dialysis.  Was sent up to dialysis from here.  Returns from dialysis now. Physical Exam  BP (!) 177/114 (BP Location: Left Arm)   Pulse (!) 104   Temp 98.5 F (36.9 C) (Oral)   Resp 17   SpO2 98%   Physical Exam  Procedures  Procedures  ED Course / MDM    Medical Decision Making Amount and/or Complexity of Data Reviewed Labs: ordered. Radiology: ordered.  Risk Prescription drug management.   6:50 PM.  Patient states he is feeling better and wants to go home.  His workup has been fairly unremarkable.  Apparently his sister has checked her with a nurse and said she is can pick him up whenever he is ready.       Towana Ozell BROCKS, MD 06/02/24 [phone]

## 2024-06-02 NOTE — Discharge Instructions (Addendum)
 You were seen in the emergency department after not feeling good.  Your CAT scan showed moderate constipation.  Please take a laxative such as MiraLAX .  You received dialysis.  Follow-up with your regular doctor and return to the emergency department if any worsening or concerning symptoms

## 2024-06-02 NOTE — Procedures (Signed)
 Received patient in bed to unit.  Alert and oriented.  Informed consent signed and in chart.  All procedures explained. RIJ tunneled dual lumen catheter accessed per policy. Tx initiated per MD order. Blood cultures x 1 set drawn from the red lumen.   TX duration: 3.5 hours  Tx complete. Blood returned. Dual lumen catheter flushed with NS, heparin  left to dwell. Caps and clamps in place.  Patient tolerated well.  Transported back to the room  Alert, without acute distress.  Hand-off given to patient's nurse.   Access used: RIJ tunneled dual lumen catheter Access issues: lines reversed  Total UF removed: 2500 ml Medication(s) given: See MAR   [facility] LITTIE [facility]

## 2024-06-02 NOTE — Procedures (Signed)
 I was present at this dialysis session. I have reviewed the session itself and made appropriate changes.   Vital signs in last 24 hours:  Temp:  [97.8 F (36.6 C)-98.6 F (37 C)] 98.6 F (37 C) (10/28 1133) Pulse Rate:  [95-96] 95 (10/28 1133) Resp:  [15-16] 15 (10/28 1133) BP: (171-174)/(100-121) 174/121 (10/28 1133) SpO2:  [96 %-97 %] 97 % (10/28 1133) Weight change:  There were no vitals filed for this visit.  Recent Labs  Lab 06/01/24 0506 06/02/24 0927  NA 133* 136  K 4.2 4.6  CL 93* 95*  CO2 21* 20*  GLUCOSE 81 100*  BUN 63* 76*  CREATININE 13.20* 15.30*  CALCIUM  9.0 9.4  PHOS 6.7*  --     Recent Labs  Lab 05/28/24 1547 05/29/24 0448 06/01/24 0506 06/02/24 0927  WBC 7.2 7.8 7.4 7.3  NEUTROABS 5.2  --   --  4.7  HGB 10.4* 10.2* 9.0* 9.8*  HCT 31.7* 31.4* 26.8* 28.8*  MCV 90.6 92.4 87.6 88.9  PLT 238 222 301 340    Scheduled Meds:  Chlorhexidine  Gluconate Cloth  6 each Topical Q0600   Continuous Infusions:  anticoagulant sodium citrate     PRN Meds:.alteplase , anticoagulant sodium citrate, feeding supplement (NEPRO CARB STEADY), heparin , heparin , lidocaine  (PF), lidocaine -prilocaine , pentafluoroprop-tetrafluoroeth   Fairy DELENA Sellar,  MD 06/02/2024, 11:50 AM

## 2024-06-02 NOTE — ED Triage Notes (Signed)
 Pt arrived via [facility] from dialysis c/o generalized weakness and feeling unwell. Pt was seen and discharged here yesterday. Pt did not receive dialysis Tx yesterday nor today. Pt states that he isnt talking much because he isn't feeling well

## 2024-06-03 LAB — HEPATITIS B SURFACE ANTIBODY, QUANTITATIVE: Hep B S AB Quant (Post): 113 m[IU]/mL

## 2024-06-03 NOTE — Progress Notes (Addendum)
 Late note entry 0846 10/29 D/c noted. Contacted out-pt HD clinic, [facility] to inform of pt d/c and anticipated arrival tomorrow. No further support needed.   Rogen Porte Dialysis Navigator [phone]

## 2024-06-07 LAB — CULTURE, BLOOD (SINGLE): Culture: NO GROWTH

## 2024-08-20 ENCOUNTER — Emergency Department (HOSPITAL_COMMUNITY)

## 2024-08-20 ENCOUNTER — Observation Stay (HOSPITAL_COMMUNITY)
Admission: EM | Admit: 2024-08-20 | Discharge: 2024-08-21 | Disposition: A | Discharge status: Home / Self Care | Source: Other Acute Inpatient Hospital | Attending: Internal Medicine | Admitting: Internal Medicine

## 2024-08-20 ENCOUNTER — Other Ambulatory Visit: Payer: Self-pay

## 2024-08-20 DIAGNOSIS — I169 Hypertensive crisis, unspecified: Secondary | ICD-10-CM | POA: Insufficient documentation

## 2024-08-20 DIAGNOSIS — Z79899 Other long term (current) drug therapy: Secondary | ICD-10-CM | POA: Diagnosis not present

## 2024-08-20 DIAGNOSIS — I10 Essential (primary) hypertension: Secondary | ICD-10-CM | POA: Diagnosis not present

## 2024-08-20 DIAGNOSIS — N186 End stage renal disease: Secondary | ICD-10-CM | POA: Diagnosis not present

## 2024-08-20 DIAGNOSIS — Z992 Dependence on renal dialysis: Secondary | ICD-10-CM | POA: Diagnosis not present

## 2024-08-20 DIAGNOSIS — K219 Gastro-esophageal reflux disease without esophagitis: Secondary | ICD-10-CM | POA: Insufficient documentation

## 2024-08-20 DIAGNOSIS — D631 Anemia in chronic kidney disease: Secondary | ICD-10-CM | POA: Insufficient documentation

## 2024-08-20 DIAGNOSIS — F32A Depression, unspecified: Secondary | ICD-10-CM | POA: Insufficient documentation

## 2024-08-20 DIAGNOSIS — G9340 Encephalopathy, unspecified: Secondary | ICD-10-CM | POA: Diagnosis not present

## 2024-08-20 DIAGNOSIS — G40909 Epilepsy, unspecified, not intractable, without status epilepticus: Secondary | ICD-10-CM | POA: Diagnosis not present

## 2024-08-20 DIAGNOSIS — Z8673 Personal history of transient ischemic attack (TIA), and cerebral infarction without residual deficits: Secondary | ICD-10-CM | POA: Diagnosis not present

## 2024-08-20 DIAGNOSIS — I120 Hypertensive chronic kidney disease with stage 5 chronic kidney disease or end stage renal disease: Secondary | ICD-10-CM | POA: Diagnosis not present

## 2024-08-20 DIAGNOSIS — D638 Anemia in other chronic diseases classified elsewhere: Secondary | ICD-10-CM

## 2024-08-20 DIAGNOSIS — R4182 Altered mental status, unspecified: Secondary | ICD-10-CM | POA: Diagnosis present

## 2024-08-20 LAB — CBC WITH DIFFERENTIAL/PLATELET
Abs Immature Granulocytes: 0.02 K/uL (ref 0.00–0.07)
Basophils Absolute: 0.1 K/uL (ref 0.0–0.1)
Basophils Relative: 1 %
Eosinophils Absolute: 0.1 K/uL (ref 0.0–0.5)
Eosinophils Relative: 1 %
HCT: 32 % — ABNORMAL LOW (ref 39.0–52.0)
Hemoglobin: 10.4 g/dL — ABNORMAL LOW (ref 13.0–17.0)
Immature Granulocytes: 0 %
Lymphocytes Relative: 20 %
Lymphs Abs: 1.3 K/uL (ref 0.7–4.0)
MCH: 29.9 pg (ref 26.0–34.0)
MCHC: 32.5 g/dL (ref 30.0–36.0)
MCV: 92 fL (ref 80.0–100.0)
Monocytes Absolute: 0.4 K/uL (ref 0.1–1.0)
Monocytes Relative: 6 %
Neutro Abs: 4.9 K/uL (ref 1.7–7.7)
Neutrophils Relative %: 72 %
Platelets: 227 K/uL (ref 150–400)
RBC: 3.48 MIL/uL — ABNORMAL LOW (ref 4.22–5.81)
RDW: 14.1 % (ref 11.5–15.5)
WBC: 6.8 K/uL (ref 4.0–10.5)
nRBC: 0 % (ref 0.0–0.2)

## 2024-08-20 LAB — COMPREHENSIVE METABOLIC PANEL WITH GFR
ALT: <5 U/L (ref 0–44)
AST: 16 U/L (ref 15–41)
Albumin: 4.1 g/dL (ref 3.5–5.0)
Alkaline Phosphatase: 55 U/L (ref 38–126)
Anion gap: 19 — ABNORMAL HIGH (ref 5–15)
BUN: 56 mg/dL — ABNORMAL HIGH (ref 8–23)
CO2: 21 mmol/L — ABNORMAL LOW (ref 22–32)
Calcium: 9.8 mg/dL (ref 8.9–10.3)
Chloride: 100 mmol/L (ref 98–111)
Creatinine, Ser: 11.4 mg/dL — ABNORMAL HIGH (ref 0.61–1.24)
GFR, Estimated: 5 mL/min — ABNORMAL LOW
Glucose, Bld: 96 mg/dL (ref 70–99)
Potassium: 4.6 mmol/L (ref 3.5–5.1)
Sodium: 140 mmol/L (ref 135–145)
Total Bilirubin: 0.4 mg/dL (ref 0.0–1.2)
Total Protein: 7.5 g/dL (ref 6.5–8.1)

## 2024-08-20 LAB — RESP PANEL BY RT-PCR (RSV, FLU A&B, COVID)  RVPGX2
Influenza A by PCR: NEGATIVE
Influenza B by PCR: NEGATIVE
Resp Syncytial Virus by PCR: NEGATIVE
SARS Coronavirus 2 by RT PCR: NEGATIVE

## 2024-08-20 LAB — URINALYSIS, W/ REFLEX TO CULTURE (INFECTION SUSPECTED)
Bacteria, UA: NONE SEEN
Bilirubin Urine: NEGATIVE
Glucose, UA: 50 mg/dL — AB
Ketones, ur: NEGATIVE mg/dL
Leukocytes,Ua: NEGATIVE
Nitrite: NEGATIVE
Protein, ur: 100 mg/dL — AB
Specific Gravity, Urine: 1.006 (ref 1.005–1.030)
pH: 8 (ref 5.0–8.0)

## 2024-08-20 LAB — PHOSPHORUS: Phosphorus: 3 mg/dL (ref 2.5–4.6)

## 2024-08-20 LAB — I-STAT CHEM 8, ED
BUN: 61 mg/dL — ABNORMAL HIGH (ref 8–23)
Calcium, Ion: 1.12 mmol/L — ABNORMAL LOW (ref 1.15–1.40)
Chloride: 103 mmol/L (ref 98–111)
Creatinine, Ser: 12.6 mg/dL — ABNORMAL HIGH (ref 0.61–1.24)
Glucose, Bld: 100 mg/dL — ABNORMAL HIGH (ref 70–99)
HCT: 33 % — ABNORMAL LOW (ref 39.0–52.0)
Hemoglobin: 11.2 g/dL — ABNORMAL LOW (ref 13.0–17.0)
Potassium: 4.9 mmol/L (ref 3.5–5.1)
Sodium: 139 mmol/L (ref 135–145)
TCO2: 25 mmol/L (ref 22–32)

## 2024-08-20 LAB — MAGNESIUM: Magnesium: 1.9 mg/dL (ref 1.7–2.4)

## 2024-08-20 LAB — CBG MONITORING, ED: Glucose-Capillary: 104 mg/dL — ABNORMAL HIGH (ref 70–99)

## 2024-08-20 LAB — TSH: TSH: 1.15 u[IU]/mL (ref 0.350–4.500)

## 2024-08-20 LAB — LIPASE, BLOOD: Lipase: 93 U/L — ABNORMAL HIGH (ref 11–51)

## 2024-08-20 LAB — LACTIC ACID, PLASMA: Lactic Acid, Venous: 1.2 mmol/L (ref 0.5–1.9)

## 2024-08-20 LAB — VITAMIN B12: Vitamin B-12: 620 pg/mL (ref 180–914)

## 2024-08-20 LAB — PROTIME-INR
INR: 1 (ref 0.8–1.2)
Prothrombin Time: 13.6 s (ref 11.4–15.2)

## 2024-08-20 MED ORDER — HEPARIN SODIUM (PORCINE) 1000 UNIT/ML IJ SOLN
INTRAMUSCULAR | Status: AC
  Filled 2024-08-20: qty 1

## 2024-08-20 MED ORDER — HYDRALAZINE HCL 50 MG PO TABS
100.0000 mg | ORAL_TABLET | Freq: Two times a day (BID) | ORAL | Status: DC
  Administered 2024-08-20 – 2024-08-21 (×2): 100 mg via ORAL
  Filled 2024-08-20 (×2): qty 2

## 2024-08-20 MED ORDER — ANTICOAGULANT SODIUM CITRATE 4% (200MG/5ML) IV SOLN: 5.0000 mL | Status: DC | PRN

## 2024-08-20 MED ORDER — METOPROLOL TARTRATE 25 MG PO TABS
25.0000 mg | ORAL_TABLET | Freq: Every day | ORAL | Status: DC
  Administered 2024-08-21: 25 mg via ORAL
  Filled 2024-08-20: qty 1

## 2024-08-20 MED ORDER — ALTEPLASE 2 MG IJ SOLR: 2.0000 mg | Freq: Once | INTRAMUSCULAR | Status: DC | PRN

## 2024-08-20 MED ORDER — HEPARIN SODIUM (PORCINE) 5000 UNIT/ML IJ SOLN
5000.0000 [IU] | Freq: Three times a day (TID) | INTRAMUSCULAR | Status: DC
  Administered 2024-08-20 – 2024-08-21 (×3): 5000 [IU] via SUBCUTANEOUS
  Filled 2024-08-20 (×3): qty 1

## 2024-08-20 MED ORDER — SEVELAMER CARBONATE 800 MG PO TABS
1600.0000 mg | ORAL_TABLET | Freq: Three times a day (TID) | ORAL | Status: DC
  Administered 2024-08-20 – 2024-08-21 (×4): 1600 mg via ORAL
  Filled 2024-08-20 (×4): qty 2

## 2024-08-20 MED ORDER — ACETAMINOPHEN 650 MG RE SUPP: 650.0000 mg | Freq: Four times a day (QID) | RECTAL | Status: DC | PRN

## 2024-08-20 MED ORDER — ACETAMINOPHEN 325 MG PO TABS
650.0000 mg | ORAL_TABLET | Freq: Four times a day (QID) | ORAL | Status: DC | PRN
  Administered 2024-08-21: 650 mg via ORAL
  Filled 2024-08-20: qty 2

## 2024-08-20 MED ORDER — ONDANSETRON HCL 4 MG PO TABS: 4.0000 mg | ORAL_TABLET | Freq: Four times a day (QID) | ORAL | Status: DC | PRN

## 2024-08-20 MED ORDER — ONDANSETRON HCL 4 MG/2ML IJ SOLN: 4.0000 mg | Freq: Four times a day (QID) | INTRAMUSCULAR | Status: DC | PRN

## 2024-08-20 MED ORDER — HYDRALAZINE HCL 20 MG/ML IJ SOLN: 10.0000 mg | Freq: Three times a day (TID) | INTRAMUSCULAR | Status: DC | PRN

## 2024-08-20 MED ORDER — CHLORHEXIDINE GLUCONATE CLOTH 2 % EX PADS
6.0000 | MEDICATED_PAD | Freq: Every day | CUTANEOUS | Status: DC
  Administered 2024-08-21: 6 via TOPICAL

## 2024-08-20 MED ORDER — PENTAFLUOROPROP-TETRAFLUOROETH EX AERO: 1.0000 | INHALATION_SPRAY | CUTANEOUS | Status: DC | PRN

## 2024-08-20 MED ORDER — LEVETIRACETAM 500 MG PO TABS
500.0000 mg | ORAL_TABLET | Freq: Two times a day (BID) | ORAL | Status: DC
  Administered 2024-08-20 – 2024-08-21 (×2): 500 mg via ORAL
  Filled 2024-08-20 (×2): qty 1

## 2024-08-20 MED ORDER — HEPARIN SODIUM (PORCINE) 1000 UNIT/ML DIALYSIS: 1000.0000 [IU] | INTRAMUSCULAR | Status: DC | PRN

## 2024-08-20 MED ORDER — LIDOCAINE-PRILOCAINE 2.5-2.5 % EX CREA: 1.0000 | TOPICAL_CREAM | CUTANEOUS | Status: DC | PRN

## 2024-08-20 MED ORDER — PANTOPRAZOLE SODIUM 40 MG PO TBEC
40.0000 mg | DELAYED_RELEASE_TABLET | Freq: Every day | ORAL | Status: DC
  Administered 2024-08-21: 40 mg via ORAL
  Filled 2024-08-20: qty 1

## 2024-08-20 MED ORDER — LOSARTAN POTASSIUM 50 MG PO TABS
50.0000 mg | ORAL_TABLET | Freq: Every day | ORAL | Status: DC
  Administered 2024-08-21: 50 mg via ORAL
  Filled 2024-08-20: qty 1

## 2024-08-20 MED ORDER — LIDOCAINE HCL (PF) 1 % IJ SOLN: 5.0000 mL | INTRAMUSCULAR | Status: DC | PRN

## 2024-08-20 MED ORDER — FLUOXETINE HCL 10 MG PO CAPS
10.0000 mg | ORAL_CAPSULE | Freq: Every day | ORAL | Status: DC
  Administered 2024-08-21: 10 mg via ORAL
  Filled 2024-08-20: qty 1

## 2024-08-20 MED ORDER — SEVELAMER CARBONATE 800 MG PO TABS
800.0000 mg | ORAL_TABLET | ORAL | Status: DC
  Administered 2024-08-21: 800 mg via ORAL

## 2024-08-20 MED ORDER — AMLODIPINE BESYLATE 5 MG PO TABS
5.0000 mg | ORAL_TABLET | Freq: Every day | ORAL | Status: DC
  Administered 2024-08-21: 5 mg via ORAL
  Filled 2024-08-20: qty 1

## 2024-08-20 NOTE — Progress Notes (Signed)
 Pt transported to unit to tx. Used pt's R chest catheter for tx Pt completed tx with no issues. Changed pts catheter dressing. Pt ripped it off. Educated pt on the importance of dressing.  Transported back to unit with no s/s distress noted.  08/20/24 2023  Vitals  Temp 98.6 F (37 C)  Pulse Rate 87  Resp 16  BP (!) 158/131  SpO2 100 %  O2 Device Room Air  Post Treatment  Hemodialysis Intake (mL) 0 mL  Liters Processed 84  Fluid Removed (mL) 3000 mL  Tolerated HD Treatment Yes

## 2024-08-20 NOTE — ED Notes (Signed)
 Pt had pulled all monitor leads off. Pt repositioned in bed and all monitor cords replaced. Pt has small amount of stool. Pt cleaned and new diaper applied. Nad.

## 2024-08-20 NOTE — Progress Notes (Signed)
 Informed by his out-pt HD clinic, [facility], that pt was arriving. Pt is TTS 8am. Will continue to assist as needed.   Bryce Miller Dialysis Nav [phone] [facility]#- [phone]

## 2024-08-20 NOTE — ED Notes (Signed)
 Received report from Bryce Miller. Pt resting. Confused. Alert. See triage notes.

## 2024-08-20 NOTE — ED Triage Notes (Signed)
 Pt lives at home and went to dialysis. Dialysis d/t feels it is infected. Nurse also stated pt is normally A&ox 4 but today x 2. Hoyer pad noted to be under pt from home .

## 2024-08-20 NOTE — H&P (Signed)
 " History and Physical    Patient: Bryce Miller FMW:[id number] DOB: May 03, 1961 DOA: 08/20/2024 DOS: the patient was seen and examined on 08/20/2024 PCP: Carlette Benita Area, MD   Patient coming from: Home  Chief Complaint:  Chief Complaint  Patient presents with   Vascular Access Problem   HPI: [name] is a 64 y.o. male with medical history significant of ESRD, GERD, hypertension, prior history of stroke (with residual left-sided weakness and memory loss) history of seizure and chronic constipation; who presented to the hospital secondary to change in mentation and per dialysis unit concerns for vascular access problems.  Workup demonstrated no fever, patient was able to follow commands appropriately and per discussion with patient and family not really frank altered mentation identified.  Patient was to 4 dialysis and found to be in hypertensive crisis.  No fever, no chest pain, no nausea, no vomiting, no upper bleeding, no new focal deficits, no sick contacts or any other complaints.  Nephrology service was consulted to provide hemodialysis and TR[facility] contacted to place patient in the hospital for further evaluation and management.  Review of Systems: As mentioned in the history of present illness. All other systems reviewed and are negative. Past Medical History:  Diagnosis Date   Constipation    ESRD (end stage renal disease) (HCC)    [facility]- [facility]   GERD (gastroesophageal reflux disease)    Hypertension    Paraplegic spinal paralysis (HCC)    Pre-diabetes    Seizure (HCC)    Stroke (HCC)    left side weakness, memory loss   Past Surgical History:  Procedure Laterality Date   A/V FISTULAGRAM N/A 05/09/2020   Procedure: A/V FISTULAGRAM;  Surgeon: Sheree Penne Bruckner, MD;  Location: [facility] INVASIVE CV LAB;  Service: Cardiovascular;  Laterality: N/A;   A/V SHUNTOGRAM Right 03/21/2018   Procedure: A/V SHUNTOGRAM;  Surgeon: Harvey Carlin BRAVO, MD;  Location: [facility] INVASIVE CV  LAB;  Service: Cardiovascular;  Laterality: Right;   AV FISTULA PLACEMENT Right 12/06/2015   Procedure: ARTERIOVENOUS (AV) FISTULA CREATION-RIGHT upper arm ;  Surgeon: Redell LITTIE Door, MD;  Location: [facility] OR;  Service: Vascular;  Laterality: Right;   AV FISTULA PLACEMENT Left 02/28/2021   Procedure: LEFT ARM ARTERIOVENOUS (AV) FISTULA CREATION;  Surgeon: Oris Krystal FALCON, MD;  Location: [facility] ORS;  Service: Vascular;  Laterality: Left;   BASCILIC VEIN TRANSPOSITION Right 08/29/2020   Procedure: RIGHT FIRST STAGE BASCILIC VEIN TRANSPOSITION VERSUS ARTERIOVENOUS GRAFT;  Surgeon: Eliza Bruckner RAMAN, MD;  Location: [facility] OR;  Service: Vascular;  Laterality: Right;   COLONOSCOPY N/A 09/30/2014   Procedure: COLONOSCOPY;  Surgeon: Lamar CHRISTELLA Hollingshead, MD;  Location: [facility];  Service: Endoscopy;  Laterality: N/A;  2:30 AM - moved to 1:10 - Ginger to notify pt   CSF SHUNT     INSERTION OF DIALYSIS CATHETER Right 07/05/2020   Procedure: INSERTION OF TUNNEL  DIALYSIS CATHETER USING 19CM PRECISION CHRONIC CATHETER;  Surgeon: Eliza Bruckner RAMAN, MD;  Location: [facility] OR;  Service: Vascular;  Laterality: Right;   IR DIALY SHUNT INTRO NEEDLE/INTRACATH INITIAL W/IMG LEFT Left 06/01/2020   IR FLUORO GUIDE CV LINE RIGHT  06/01/2020   LIGATION ARTERIOVENOUS GORTEX GRAFT Right 07/05/2020   Procedure: LIGATION EXCISION  ARTERIOVENOUS GORTEX GRAFT;  Surgeon: Eliza Bruckner RAMAN, MD;  Location: [facility] OR;  Service: Vascular;  Laterality: Right;   PERIPHERAL VASCULAR BALLOON ANGIOPLASTY Right 03/21/2018   Procedure: PERIPHERAL VASCULAR BALLOON ANGIOPLASTY;  Surgeon: Harvey Carlin BRAVO, MD;  Location: [facility] INVASIVE CV  LAB;  Service: Cardiovascular;  Laterality: Right;  AV GRAFT   PERIPHERAL VASCULAR INTERVENTION Right 05/09/2020   Procedure: PERIPHERAL VASCULAR INTERVENTION;  Surgeon: Sheree Penne Bruckner, MD;  Location: [facility] INVASIVE CV LAB;  Service: Cardiovascular;  Laterality: Right;   TUNNELLED CATHETER EXCHANGE N/A 05/18/2024    Procedure: TUNNELLED CATHETER EXCHANGE;  Surgeon: Magda Debby SAILOR, MD;  Location: [facility] LAB;  Service: Cardiovascular;  Laterality: N/A;   Social History:  reports that he has never smoked. He has never used smokeless tobacco. He reports that he does not currently use drugs after having used the following drugs: Cocaine. He reports that he does not drink alcohol.  Allergies[1]  Family History  Problem Relation Age of Onset   Diabetes Mother     Prior to Admission medications  Medication Sig Start Date End Date Taking? Authorizing Provider  amLODipine  (NORVASC ) 5 MG tablet Take 5 mg by mouth daily. 08/15/20  Yes [provider]  FLUoxetine  (PROZAC ) 10 MG capsule Take 10 mg by mouth daily. 08/15/20  Yes [provider]  furosemide  (LASIX ) 40 MG tablet Take 40 mg by mouth daily.    Yes [provider]  hydrALAZINE  (APRESOLINE ) 100 MG tablet Take 100 mg by mouth 2 (two) times daily. 07/11/24  Yes [provider]  levETIRAcetam  (KEPPRA ) 500 MG tablet Take 500 mg by mouth 2 (two) times daily. 08/15/20  Yes [provider]  losartan  (COZAAR ) 100 MG tablet Take 50 mg by mouth daily. 11/15/23  Yes [provider]  metoprolol  tartrate (LOPRESSOR ) 25 MG tablet Take 25 mg by mouth daily. 05/23/24  Yes [provider]  omeprazole (PRILOSEC) 20 MG capsule Take 20 mg by mouth daily. 05/23/24  Yes [provider]  sevelamer  carbonate (RENVELA ) 800 MG tablet Take 800-1,600 mg by mouth See admin instructions. 1600 mg with meals, 800 mg with snacks   Yes [provider]    Physical Exam: Vitals:   08/20/24 1800 08/20/24 1815 08/20/24 1830 08/20/24 1845  BP: (!) 180/118 (!) 201/116 (!) 178/122 (!) 167/80  Pulse: 77 71 71 (!) 2  Resp: 16 15 14 16   Temp:      TempSrc:      SpO2: 100% 100% 98% 99%  Weight:      Height:       General exam: Alert, awake, able to follow simple commands.  In no acute distress.  Patient oriented x  1; lost regarding year and space (he thinks he is at his HD center) Respiratory system: Good saturation on room air. Cardiovascular system: Rate controlled, no rubs, no gallops, no JVD on exam. Gastrointestinal system: Abdomen is nondistended, soft and nontender. No organomegaly or masses felt. Normal bowel sounds heard. Central nervous system: No new focal neurologic deficit appreciated.  Patient with residual left-sided weakness from previous stroke. Extremities: No cyanosis or clubbing. Skin: No petechiae; right IJ hemodialysis catheter in place; no erythema, exudate or active drainage appreciated from catheter site. Psychiatry: Judgement and insight appear stable.  Mood and affect appropriate.  Data Reviewed: Lactic acid: 1.2 Comprehensive metabolic panel: Sodium 140, potassium 4.6, chloride 100, bicarb 21, BUN 56, creatinine 11.40, normal LFTs and GFR 5. CBC: WBC 6.8, hemoglobin 10.4, platelet count 227K  Assessment and Plan: 1-presumed encephalopathy - Patient oriented x 1 and confused to space untying - Per reports received by EDP from family members patient with underlying change in mentation since his stroke. - They reported no significant difference from baseline - Urine to be  thorough we will check TSH and B12 - Patient will receive hemodialysis as a scheduled to have today - Control blood pressure - Continue providing constant reorientation and supportive care - Follow clinical response.  2-ESRD - Dialysis per nephrology service - Patient normally follow at [facility] in [place] -TTS - Will follow renal service recommendation.  3-hypertensive crisis -Patient will have hemodialysis today - Resuming home antihypertensive agents - Will follow vital signs response - As needed hydralazine  has been ordered.  4-anemia of chronic disease - No overt bleeding appreciated - Current hemoglobin around 10 - IV iron and Epogen  therapy as per renal discretion.  5-prior history of  stroke - Continue risk factor modifications  6-history of seizure - No active seizure reported - Continue Keppra .  7-history of depression - Resume the use of Prozac .  8-GERD - Continue PPI.    Advance Care Planning:   Code Status: Full Code   Consults: Nephrology service  Family Communication: No family at bedside.  Severity of Illness: The appropriate patient status for this patient is OBSERVATION. Observation status is judged to be reasonable and necessary in order to provide the required intensity of service to ensure the patient's safety. The patient's presenting symptoms, physical exam findings, and initial radiographic and laboratory data in the context of their medical condition is felt to place them at decreased risk for further clinical deterioration. Furthermore, it is anticipated that the patient will be medically stable for discharge from the hospital within 2 midnights of admission.   Author: Eric Nunnery, MD 08/20/2024 7:16 PM  For on call review [email or web address].     [1] No Known Allergies  "

## 2024-08-20 NOTE — Consult Note (Signed)
 [facility] Renal Consultation Note  Requesting MD: Glendia Breeding, MD Indication for Consultation:  ESRD  Chief complaint: I don't know   HPI:  Bryce Miller is a 64 y.o. male with a history of end-stage renal disease who presented to the hospital after his outpatient dialysis facility directed him to the ER.  Per charting, the patient was altered and they were worried about his tunneled catheter and wanting to ensure this wasn't infected.  ER attending assessed the catheter and saw no overt signs of infection.  Team then reached out to his sister who states that he has limited speech and has had some altered mental status because of the prior stroke.  In the ER he was noted to be markedly hypertensive.  His sister felt that on her assessment in the ER that he was at his baseline per ER report.  The patient states that he does not know why he came.  His sister has expressed that when he tells that dialysis unit that he feels bad, that he is sent to the hospital.  He states that he lives with his sister.  He has been on 2 liters oxygen here and is not on oxygen at home per his own report.  Nephrology is consulted for assistance with management of end-stage renal disease and hemodialysis.  He has been ordered for dialysis today and blood cultures were ordered.  He is usually at [facility] TTS.     PMHx:   Past Medical History:  Diagnosis Date   Constipation    ESRD (end stage renal disease) (HCC)    [facility]   GERD (gastroesophageal reflux disease)    Hypertension    Paraplegic spinal paralysis (HCC)    Pre-diabetes    Seizure (HCC)    Stroke (HCC)    left side weakness, memory loss    Past Surgical History:  Procedure Laterality Date   A/V FISTULAGRAM N/A 05/09/2020   Procedure: A/V FISTULAGRAM;  Surgeon: Sheree Penne Bruckner, MD;  Location: [facility] INVASIVE CV LAB;  Service: Cardiovascular;  Laterality: N/A;   A/V SHUNTOGRAM Right 03/21/2018   Procedure:  A/V SHUNTOGRAM;  Surgeon: Harvey Carlin BRAVO, MD;  Location: [facility] INVASIVE CV LAB;  Service: Cardiovascular;  Laterality: Right;   AV FISTULA PLACEMENT Right 12/06/2015   Procedure: ARTERIOVENOUS (AV) FISTULA CREATION-RIGHT upper arm ;  Surgeon: Redell LITTIE Door, MD;  Location: [facility] OR;  Service: Vascular;  Laterality: Right;   AV FISTULA PLACEMENT Left 02/28/2021   Procedure: LEFT ARM ARTERIOVENOUS (AV) FISTULA CREATION;  Surgeon: Oris Krystal FALCON, MD;  Location: [facility] ORS;  Service: Vascular;  Laterality: Left;   BASCILIC VEIN TRANSPOSITION Right 08/29/2020   Procedure: RIGHT FIRST STAGE BASCILIC VEIN TRANSPOSITION VERSUS ARTERIOVENOUS GRAFT;  Surgeon: Eliza Bruckner RAMAN, MD;  Location: [facility] OR;  Service: Vascular;  Laterality: Right;   COLONOSCOPY N/A 09/30/2014   Procedure: COLONOSCOPY;  Surgeon: Lamar CHRISTELLA Hollingshead, MD;  Location[facility];  Service: Endoscopy;  Laterality: N/A;  2:30 AM - moved to 1:10 - Ginger to notify pt   CSF SHUNT     INSERTION OF DIALYSIS CATHETER Right 07/05/2020   Procedure: INSERTION OF TUNNEL  DIALYSIS CATHETER USING 19CM PRECISION CHRONIC CATHETER;  Surgeon: Eliza Bruckner RAMAN, MD;  Location: [facility] OR;  Service: Vascular;  Laterality: Right;   IR DIALY SHUNT INTRO NEEDLE/INTRACATH INITIAL W/IMG LEFT Left 06/01/2020   IR FLUORO GUIDE CV LINE RIGHT  06/01/2020   LIGATION ARTERIOVENOUS GORTEX GRAFT Right 07/05/2020  Procedure: LIGATION EXCISION  ARTERIOVENOUS GORTEX GRAFT;  Surgeon: Eliza Lonni RAMAN, MD;  Location: [facility] OR;  Service: Vascular;  Laterality: Right;   PERIPHERAL VASCULAR BALLOON ANGIOPLASTY Right 03/21/2018   Procedure: PERIPHERAL VASCULAR BALLOON ANGIOPLASTY;  Surgeon: Harvey Carlin BRAVO, MD;  Location: [facility] INVASIVE CV LAB;  Service: Cardiovascular;  Laterality: Right;  AV GRAFT   PERIPHERAL VASCULAR INTERVENTION Right 05/09/2020   Procedure: PERIPHERAL VASCULAR INTERVENTION;  Surgeon: Sheree Penne Lonni, MD;  Location: [facility] INVASIVE CV LAB;  Service:  Cardiovascular;  Laterality: Right;   TUNNELLED CATHETER EXCHANGE N/A 05/18/2024   Procedure: TUNNELLED CATHETER EXCHANGE;  Surgeon: Magda Debby SAILOR, MD;  Location: [facility] LAB;  Service: Cardiovascular;  Laterality: N/A;    Family Hx:  Family History  Problem Relation Age of Onset   Diabetes Mother     Social History:  reports that he has never smoked. He has never used smokeless tobacco. He reports that he does not currently use drugs after having used the following drugs: Cocaine. He reports that he does not drink alcohol.  Allergies: Allergies[1]  Medications: Prior to Admission medications  Medication Sig Start Date End Date Taking? Authorizing Provider  amLODipine  (NORVASC ) 5 MG tablet Take 5 mg by mouth daily. 08/15/20   [provider]  cloNIDine  (CATAPRES  - DOSED IN MG/24 HR) 0.2 mg/24hr patch Place 1 patch (0.2 mg total) onto the skin once a week. 02/06/24   Ricky Fines, MD  FLUoxetine  (PROZAC ) 10 MG capsule Take 10 mg by mouth daily. 08/15/20   [provider]  furosemide  (LASIX ) 40 MG tablet Take 40 mg by mouth daily.     [provider]  hydrALAZINE  (APRESOLINE ) 50 MG tablet Take 50 mg by mouth 3 (three) times daily. 05/28/24   [provider]  levETIRAcetam  (KEPPRA ) 500 MG tablet Take 500 mg by mouth 2 (two) times daily. 08/15/20   [provider]  losartan  (COZAAR ) 100 MG tablet Take 50 mg by mouth daily. 11/15/23   [provider]  metoprolol  tartrate (LOPRESSOR ) 25 MG tablet Take 25 mg by mouth daily. 05/23/24   [provider]  omeprazole (PRILOSEC) 20 MG capsule Take 20 mg by mouth daily. 05/23/24   [provider]  sevelamer  carbonate (RENVELA ) 800 MG tablet Take 800-1,600 mg by mouth See admin instructions. 1600 mg with meals, 800 mg with snacks    [provider]   I have reviewed the patient's current and reported prior to admission medications.   Labs:     Latest Ref Rng & Units  08/20/2024    9:25 AM 08/20/2024    9:23 AM 06/02/2024    9:27 AM  BMP  Glucose 70 - 99 mg/dL 96  899  899   BUN 8 - 23 mg/dL 56  61  76   Creatinine 0.61 - 1.24 mg/dL 88.59  87.39  84.69   Sodium 135 - 145 mmol/L 140  139  136   Potassium 3.5 - 5.1 mmol/L 4.6  4.9  4.6   Chloride 98 - 111 mmol/L 100  103  95   CO2 22 - 32 mmol/L 21   20   Calcium  8.9 - 10.3 mg/dL 9.8   9.4     Urinalysis    Component Value Date/Time   COLORURINE STRAW (A) 08/20/2024 0914   APPEARANCEUR CLEAR 08/20/2024 0914   APPEARANCEUR Cloudy (A) 11/20/2023 1315   LABSPEC 1.006 08/20/2024 0914   PHURINE 8.0 08/20/2024 0914   GLUCOSEU 50 (A) 08/20/2024  0914   HGBUR SMALL (A) 08/20/2024 0914   BILIRUBINUR NEGATIVE 08/20/2024 0914   BILIRUBINUR Negative 11/20/2023 1315   KETONESUR NEGATIVE 08/20/2024 0914   PROTEINUR 100 (A) 08/20/2024 0914   NITRITE NEGATIVE 08/20/2024 0914   LEUKOCYTESUR NEGATIVE 08/20/2024 0914     ROS:  Limited secondary to mental status but see above   Physical Exam: Vitals:   08/20/24 1300 08/20/24 1315  BP: (!) 214/194   Pulse: 83   Resp:    Temp:    SpO2:  93%     General: adult male in stretcher in NAD   HEENT: NCAT Eyes: EOMI sclera anicteric Neck: supple trachea midline  Heart: S1S2 no rub Lungs: clear and unlabored on 2 liters Abdomen: soft/nt/nd Extremities: no edema; no cyanosis or clubbing Skin: no rash on extremities exposed Neuro: patient is oriented to self; year is 2020, we are at [facility]  Psych: no anxiety or agitation Access RIJ tunneled catheter - no erythema or exudate  Outpatient HD unit:  [facility]  TTS RIJ tunn catheter   Assessment/Plan:  # ESRD - HD per TTS schedule - Will call [facility] in AM - I did not reach them this afternoon - Note he has a tunneled dialysis catheter in place.  No erythema or exudate.  Blood cultures were ordered  # HTN  - Hypertensive - HD today  - Continue other current home regimen   #  Altered mental status - Patient has some AMS at baseline since stroke per his family - Family assessed in the ER and felt he was at his baseline  - Blood cultures were ordered  # Anemia of CKD  - Hb acceptable  - Calling his outpatient unit again for full orders and med info   # Metabolic bone disease  - Calling his outpatient unit again in AM for full orders and med info    Thank you for the consult.  Please do not hesitate to contact me with any questions regarding our patient.   Katheryn JAYSON Saba 08/20/2024, 5:33 PM         [1] No Known Allergies

## 2024-08-20 NOTE — ED Notes (Signed)
 Pt has been undressed and cleaned up. Pt has a clean brief with malewick due to not being able to communicate when he needs to use the restroom. Pt has call light in reach

## 2024-08-20 NOTE — ED Provider Notes (Signed)
 " [place] EMERGENCY DEPARTMENT AT [facility] Provider Note   CSN: [id number] Arrival date & time: 08/20/24  9142     Patient presents with: Vascular Access Problem   Bryce Miller is a 64 y.o. male.   HPI 64 year old male presents from dialysis with altered mental status and concern for dialysis access infection.  Patient is unable to provide much history.  According to EMS, the patient went to dialysis but he has been having some involuntary jerking movements and was altered from baseline and they were concerned about a chest catheter infection.  The patient is able to provide some but minimal history.  Chart review, patient has a history of ESRD, hypertension, seizure disorder, CVA, paraplegic spinal paralysis.  Prior to Admission medications  Medication Sig Start Date End Date Taking? Authorizing Provider  amLODipine  (NORVASC ) 5 MG tablet Take 5 mg by mouth daily. 08/15/20  Yes [provider]  FLUoxetine  (PROZAC ) 10 MG capsule Take 10 mg by mouth daily. 08/15/20  Yes [provider]  furosemide  (LASIX ) 40 MG tablet Take 40 mg by mouth daily.    Yes [provider]  hydrALAZINE  (APRESOLINE ) 100 MG tablet Take 100 mg by mouth 2 (two) times daily. 07/11/24  Yes [provider]  levETIRAcetam  (KEPPRA ) 500 MG tablet Take 500 mg by mouth 2 (two) times daily. 08/15/20  Yes [provider]  losartan  (COZAAR ) 100 MG tablet Take 50 mg by mouth daily. 11/15/23  Yes [provider]  metoprolol  tartrate (LOPRESSOR ) 25 MG tablet Take 25 mg by mouth daily. 05/23/24  Yes [provider]  omeprazole (PRILOSEC) 20 MG capsule Take 20 mg by mouth daily. 05/23/24  Yes [provider]  sevelamer  carbonate (RENVELA ) 800 MG tablet Take 800-1,600 mg by mouth See admin instructions. 1600 mg with meals, 800 mg with snacks   Yes [provider]    Allergies: Patient has no known allergies.    Review of Systems  Unable  to perform ROS: Mental status change    Updated Vital Signs BP (!) 194/116   Pulse 80   Temp 97.9 F (36.6 C) (Oral)   Resp 14   Ht 5' 11 (1.803 m)   Wt 86.2 kg   SpO2 95%   BMI 26.50 kg/m   Physical Exam Vitals and nursing note reviewed.  Constitutional:      Appearance: He is well-developed. He is not diaphoretic.  HENT:     Head: Normocephalic and atraumatic.  Eyes:     Pupils: Pupils are equal, round, and reactive to light.  Cardiovascular:     Rate and Rhythm: Normal rate and regular rhythm.     Heart sounds: Normal heart sounds.  Pulmonary:     Effort: Pulmonary effort is normal.     Breath sounds: Normal breath sounds.  Chest:    Abdominal:     General: There is no distension.     Palpations: Abdomen is soft.     Tenderness: There is generalized abdominal tenderness.  Musculoskeletal:     Cervical back: No rigidity.  Skin:    General: Skin is warm and dry.  Neurological:     Mental Status: He is alert.     Comments: Awake, alert, knows his name but is disoriented to place (thinks he is at dialysis) and does not know the date.  He has normal strength in the right upper and right lower extremity.  The left upper extremity seems to have some involuntary movement and  seems contracted.  Has some intermittent twitching in the left upper and lower extremity.     (all labs ordered are listed, but only abnormal results are displayed) Labs Reviewed  COMPREHENSIVE METABOLIC PANEL WITH GFR - Abnormal; Notable for the following components:      Result Value   CO2 21 (*)    BUN 56 (*)    Creatinine, Ser 11.40 (*)    GFR, Estimated 5 (*)    Anion gap 19 (*)    All other components within normal limits  CBC WITH DIFFERENTIAL/PLATELET - Abnormal; Notable for the following components:   RBC 3.48 (*)    Hemoglobin 10.4 (*)    HCT 32.0 (*)    All other components within normal limits  URINALYSIS, W/ REFLEX TO CULTURE (INFECTION SUSPECTED) - Abnormal; Notable for the  following components:   Color, Urine STRAW (*)    Glucose, UA 50 (*)    Hgb urine dipstick SMALL (*)    Protein, ur 100 (*)    All other components within normal limits  LIPASE, BLOOD - Abnormal; Notable for the following components:   Lipase 93 (*)    All other components within normal limits  CBG MONITORING, ED - Abnormal; Notable for the following components:   Glucose-Capillary 104 (*)    All other components within normal limits  I-STAT CHEM 8, ED - Abnormal; Notable for the following components:   BUN 61 (*)    Creatinine, Ser 12.60 (*)    Glucose, Bld 100 (*)    Calcium , Ion 1.12 (*)    Hemoglobin 11.2 (*)    HCT 33.0 (*)    All other components within normal limits  RESP PANEL BY RT-PCR (RSV, FLU A&B, COVID)  RVPGX2  CULTURE, BLOOD (ROUTINE X 2)  CULTURE, BLOOD (ROUTINE X 2)  LACTIC ACID, PLASMA  PROTIME-INR  HEPATITIS B SURFACE ANTIGEN  HEPATITIS B SURFACE ANTIBODY, QUANTITATIVE    EKG: EKG Interpretation Date/Time:  Thursday August 20 2024 09:11:01 EST Ventricular Rate:  91 PR Interval:  216 QRS Duration:  95 QT Interval:  369 QTC Calculation: 454 R Axis:   -52  Text Interpretation: Sinus rhythm Atrial premature complex Prolonged PR interval Left anterior fascicular block Abnormal R-wave progression, late transition Nonspecific T abnormalities, lateral leads Baseline wander in lead(s) V1 similar to Oct 2025 Confirmed by Freddi Hamilton [phone]) on 08/20/2024 9:23:08 AM  Radiology: CT ABDOMEN PELVIS WO CONTRAST Result Date: 08/20/2024 EXAM: CT ABDOMEN AND PELVIS WITHOUT CONTRAST 08/20/2024 11:03:01 AM TECHNIQUE: CT of the abdomen and pelvis was performed without the administration of intravenous contrast. Multiplanar reformatted images are provided for review. Automated exposure control, iterative reconstruction, and/or weight-based adjustment of the mA/kV was utilized to reduce the radiation dose to as low as reasonably achievable. COMPARISON: 06/02/2024 CLINICAL  HISTORY: Abdominal pain, disorientation. FINDINGS: LOWER CHEST: Cardiomegaly with right coronary artery atheromatous vascular calcification. LIVER: The liver is unremarkable. GALLBLADDER AND BILE DUCTS: Mildly contracted gallbladder. No biliary ductal dilatation. SPLEEN: No acute abnormality. PANCREAS: No acute abnormality. ADRENAL GLANDS: No acute abnormality. KIDNEYS, URETERS AND BLADDER: Innumerous cystic lesions expanding both kidneys favoring autosomal dominant polycystic kidney disease, with some complexity; enhancement characteristics not assessed on today's noncontrast examination. No substantial change from previous. No stones in the kidneys or ureters. No hydronephrosis. No perinephric or periureteral stranding. Empty urinary bladder. GI AND BOWEL: Stomach demonstrates no acute abnormality. Prominent stool ball in the rectum, suspicious for fecal impaction. Perirectal stranding. Stercoral colitis is also not excluded  although no pneumatosis is observed. There is no bowel obstruction. PERITONEUM AND RETROPERITONEUM: No ascites. No free air. VASCULATURE: Aorta is normal in caliber. Systemic atherosclerosis is present, including the aorta and iliac arteries. Atheromatous calcified plaque proximally in the superior mesenteric artery. LYMPH NODES: No lymphadenopathy. REPRODUCTIVE ORGANS: No acute abnormality. BONES AND SOFT TISSUES: No acute osseous abnormality. No focal soft tissue abnormality. IMPRESSION: 1. Prominent stool ball in the rectum with perirectal stranding, suspicious for fecal impaction. Cannot completely exclude stercoral colitis. 2. 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 3. Cardiomegaly with right coronary artery atherosclerotic calcification. 4. Systemic atherosclerosis, including the aorta and iliac arteries, with calcified plaque in the proximal superior mesenteric artery. Electronically signed by: Ryan Salvage MD 08/20/2024 11:17 AM EST RP  Workstation: [id number]   CT Head Wo Contrast Result Date: 08/20/2024 EXAM: CT HEAD WITHOUT CONTRAST 08/20/2024 11:03:01 AM TECHNIQUE: CT of the head was performed without the administration of intravenous contrast. Automated exposure control, iterative reconstruction, and/or weight based adjustment of the mA/kV was utilized to reduce the radiation dose to as low as reasonably achievable. COMPARISON: Head CT 06/02/2024. CLINICAL HISTORY: Mental status change, unknown cause. FINDINGS: BRAIN AND VENTRICLES: There is no evidence of an acute infarct, intracranial hemorrhage, mass, midline shift, hydrocephalus, or extra-axial fluid collection. Hypodensities in the cerebral white matter bilaterally are unchanged and nonspecific but compatible with severe chronic small vessel ischemic disease. A chronic infarct is again noted in the right thalamus. There is mild to moderate cerebral atrophy. Calcified atherosclerosis at the skull base. ORBITS: No acute abnormality. SINUSES: No acute abnormality. SOFT TISSUES AND SKULL: No acute soft tissue abnormality. No skull fracture. IMPRESSION: 1. No acute intracranial abnormality. 2. Severe chronic small vessel ischemic disease. Electronically signed by: Dasie Hamburg MD 08/20/2024 11:15 AM EST RP Workstation: [id number]   DG Chest Port 1 View Result Date: 08/20/2024 EXAM: 1 VIEW(S) XRAY OF THE CHEST 08/20/2024 09:25:00 AM COMPARISON: 04/01/2024 CLINICAL HISTORY: AMS AMS AMS AMS AMS FINDINGS: LINES, TUBES AND DEVICES: Right IJ CVC in place with tip terminating over the right atrium. Right axillary vascular stent noted. LUNGS AND PLEURA: Low lung volumes. No focal pulmonary opacity. No pleural effusion. No pneumothorax. HEART AND MEDIASTINUM: Stable cardiomegaly. Tortuous thoracic aorta. Atherosclerotic calcifications. BONES AND SOFT TISSUES: No acute osseous abnormality. IMPRESSION: 1. No acute cardiopulmonary findings. 2. Right IJ central venous catheter tip projects over the  right atrium. 3. Stable cardiomegaly. Electronically signed by: Evalene Coho MD 08/20/2024 09:34 AM EST RP Workstation: [id number]     Procedures   Medications Ordered in the ED  Chlorhexidine  Gluconate Cloth 2 % PADS 6 each (has no administration in time range)  heparin  injection 5,000 Units (has no administration in time range)                                    Medical Decision Making Amount and/or Complexity of Data Reviewed Independent Historian:     Details: Sister Labs: ordered.    Details: Elevated creatinine, normal potassium Radiology: ordered and independent interpretation performed.    Details: No head bleed ECG/medicine tests: ordered and independent interpretation performed.    Details: No ischemia  Risk Decision regarding hospitalization.   Patient presents from dialysis.  Initial concern relayed by EMS was possible dialysis access infection, altered mental status, and shaking.  However when family arrived, it seems that the dialysis center often sends him to  the ER if he states that he is not feeling well.  He chronically has altered mental status and from their assessment at the bedside he seems to be at his normal.  He has chronic left-sided symptoms from prior stroke and paraplegia.  The patient is quite hypertensive however.  I do not see an obvious infection.  I did discuss with nephrology, Dr. Jerrye, who recommends admission given his uncontrolled hypertension, she will dialyze, and we can follow the cultures for the possible infection.  Otherwise, he did have some nonspecific abdominal pain and so a CT was obtained.  There is concern for possible fecal impaction.  However when I went in with the nurse as a chaperone, a rectal exam was performed and there was essentially no stool.  Certainly no stool ball/fecal impaction.  Discussed with Dr. Ricky for admission.     Final diagnoses:  Uncontrolled hypertension    ED Discharge Orders     None           Freddi Hamilton, MD 08/20/24 1452  "

## 2024-08-21 DIAGNOSIS — D638 Anemia in other chronic diseases classified elsewhere: Secondary | ICD-10-CM

## 2024-08-21 DIAGNOSIS — Z8673 Personal history of transient ischemic attack (TIA), and cerebral infarction without residual deficits: Secondary | ICD-10-CM

## 2024-08-21 DIAGNOSIS — F32A Depression, unspecified: Secondary | ICD-10-CM

## 2024-08-21 LAB — CBC
HCT: 34.1 % — ABNORMAL LOW (ref 39.0–52.0)
Hemoglobin: 11.2 g/dL — ABNORMAL LOW (ref 13.0–17.0)
MCH: 29.5 pg (ref 26.0–34.0)
MCHC: 32.8 g/dL (ref 30.0–36.0)
MCV: 89.7 fL (ref 80.0–100.0)
Platelets: 249 K/uL (ref 150–400)
RBC: 3.8 MIL/uL — ABNORMAL LOW (ref 4.22–5.81)
RDW: 13.6 % (ref 11.5–15.5)
WBC: 8.7 K/uL (ref 4.0–10.5)
nRBC: 0 % (ref 0.0–0.2)

## 2024-08-21 LAB — BASIC METABOLIC PANEL WITH GFR
Anion gap: 16 — ABNORMAL HIGH (ref 5–15)
BUN: 37 mg/dL — ABNORMAL HIGH (ref 8–23)
CO2: 26 mmol/L (ref 22–32)
Calcium: 9.9 mg/dL (ref 8.9–10.3)
Chloride: 95 mmol/L — ABNORMAL LOW (ref 98–111)
Creatinine, Ser: 8.43 mg/dL — ABNORMAL HIGH (ref 0.61–1.24)
GFR, Estimated: 7 mL/min — ABNORMAL LOW
Glucose, Bld: 78 mg/dL (ref 70–99)
Potassium: 3.9 mmol/L (ref 3.5–5.1)
Sodium: 137 mmol/L (ref 135–145)

## 2024-08-21 LAB — HEPATITIS B SURFACE ANTIGEN: Hepatitis B Surface Ag: NONREACTIVE

## 2024-08-21 MED ORDER — AMLODIPINE BESYLATE 5 MG PO TABS
5.0000 mg | ORAL_TABLET | Freq: Once | ORAL | Status: AC
  Administered 2024-08-21: 5 mg via ORAL
  Filled 2024-08-21: qty 1

## 2024-08-21 MED ORDER — CINACALCET HCL 30 MG PO TABS: 30.0000 mg | ORAL_TABLET | ORAL | Status: DC

## 2024-08-21 MED ORDER — CHLORHEXIDINE GLUCONATE CLOTH 2 % EX PADS: 6.0000 | MEDICATED_PAD | Freq: Every day | CUTANEOUS | Status: DC

## 2024-08-21 MED ORDER — CALCITRIOL 0.5 MCG PO CAPS
2.7500 ug | ORAL_CAPSULE | ORAL | Status: DC
  Filled 2024-08-21: qty 1

## 2024-08-21 MED ORDER — AMLODIPINE BESYLATE 10 MG PO TABS: 10.0000 mg | ORAL_TABLET | Freq: Every day | ORAL | 3 refills | Status: AC

## 2024-08-21 MED ORDER — AMLODIPINE BESYLATE 5 MG PO TABS: 10.0000 mg | ORAL_TABLET | Freq: Every evening | ORAL | Status: DC

## 2024-08-21 NOTE — Discharge Summary (Signed)
 " Physician Discharge Summary   Patient: Bryce Miller MRN: [id number] DOB: 1961/05/11  Admit date:     08/20/2024  Discharge date: 08/21/24  Discharge Physician: Eric Nunnery   PCP: Carlette Benita Area, MD   Recommendations at discharge:  Reassess blood pressure and adjust antihypertensive regimen Repeat CBC to follow hemoglobin trend   Discharge Diagnoses: Principal Problem:   Encephalopathy acute Active Problems:   ESRD on dialysis [facility])   Uncontrolled hypertension  Brief Hospital admission narrative: Bryce Miller is a 64 y.o. male with medical history significant of ESRD, GERD, hypertension, prior history of stroke (with residual left-sided weakness and memory loss) history of seizure and chronic constipation; who presented to the hospital secondary to change in mentation and per dialysis unit concerns for vascular access problems.   Workup demonstrated no fever, patient was able to follow commands appropriately and per discussion with patient and family not really frank altered mentation identified.  Patient was to 4 dialysis and found to be in hypertensive crisis.   No fever, no chest pain, no nausea, no vomiting, no upper bleeding, no new focal deficits, no sick contacts or any other complaints.   Nephrology service was consulted to provide hemodialysis and [facility] contacted to place patient in the hospital for further evaluation and management.  Assessment and Plan: Assessment & Plan Seizure disorder (HCC) - No acute seizure appreciated - Continue the use of Keppra  - Continue patient follow-up with neurology service. -Overall condition appears and has remained stable throughout hospitalization. ESRD on dialysis [facility]) - Continue hemodialysis service as per outpatient schedule (TTS) - Last treatment 08/20/2024; next treatment anticipated 08/22/24 at his dialysis center. - Patient to continue the use of calcitriol  and Sensipar  as per nephrology discretion - Continue the use  of Renvela . Uncontrolled hypertension - Continue to follow heart healthy/low-sodium diet - Continue dialysis for volume management and assisting with pressure control - Resume home antihypertensive agents; patient's amlodipine  has been adjusted to 10 mg daily instead of 5 as previously prescribed. - Continue to follow vital signs. GERD (gastroesophageal reflux disease) - Continue PPI. Anemia of chronic disease - No overt bleeding appreciated - Continue to follow hemoglobin trend - IV iron and Epogen  therapy as per nephrology discretion. History of stroke - Patient with residual left-sided weakness and also some cognitive impairment - Continue risk factor modifications. Depression - Continue the use of Prozac  - No suicidal ideation or hallucination. - Patient mentation is stable and at baseline - B12 and TSH within normal limits.  Consultants: Nephrology service. Procedures performed: See below for x-ray reports. Disposition: Home Diet recommendation: Renal diet  DISCHARGE MEDICATION: Allergies as of 08/21/2024   No Known Allergies      Medication List     TAKE these medications    amLODipine  10 MG tablet Commonly known as: NORVASC  Take 1 tablet (10 mg total) by mouth daily. What changed:  medication strength how much to take   FLUoxetine  10 MG capsule Commonly known as: PROZAC  Take 10 mg by mouth daily.   furosemide  40 MG tablet Commonly known as: LASIX  Take 40 mg by mouth daily.   hydrALAZINE  100 MG tablet Commonly known as: APRESOLINE  Take 100 mg by mouth 2 (two) times daily.   levETIRAcetam  500 MG tablet Commonly known as: KEPPRA  Take 500 mg by mouth 2 (two) times daily.   losartan  100 MG tablet Commonly known as: COZAAR  Take 50 mg by mouth daily.   metoprolol  tartrate 25 MG tablet Commonly known as: LOPRESSOR  Take 25  mg by mouth daily.   omeprazole 20 MG capsule Commonly known as: PRILOSEC Take 20 mg by mouth daily.   sevelamer  carbonate 800  MG tablet Commonly known as: RENVELA  Take 800-1,600 mg by mouth See admin instructions. 1600 mg with meals, 800 mg with snacks        Follow-up Information     Fanta, Benita Area, MD. Schedule an appointment as soon as possible for a visit in 10 day(s).   Specialty: Internal Medicine Contact information: [address] [place] [address] [phone]                Discharge Exam: Fredricka Weights   08/20/24 0905  Weight: 86.2 kg    General exam: Alert, awake, able to follow simple commands.  In no acute distress.  Patient oriented x 1.  Following commands appropriately and in no acute distress. Respiratory system: Good saturation on room air. Cardiovascular system: Rate controlled, no rubs, no gallops, no JVD on exam. Gastrointestinal system: Abdomen is nondistended, soft and nontender. No organomegaly or masses felt. Normal bowel sounds heard. Central nervous system: No new focal neurologic deficit appreciated.  Patient with residual left-sided weakness from previous stroke. Extremities: No cyanosis or clubbing. Skin: No petechiae; right IJ hemodialysis catheter in place; no erythema, exudate or active drainage appreciated from catheter site. Psychiatry: Judgement and insight appear stable.  Mood and affect appropriate.  Condition at discharge: stable  The results of significant diagnostics from this hospitalization (including imaging, microbiology, ancillary and laboratory) are listed below for reference.   Imaging Studies: CT ABDOMEN PELVIS WO CONTRAST Result Date: 08/20/2024 EXAM: CT ABDOMEN AND PELVIS WITHOUT CONTRAST 08/20/2024 11:03:01 AM TECHNIQUE: CT of the abdomen and pelvis was performed without the administration of intravenous contrast. Multiplanar reformatted images are provided for review. Automated exposure control, iterative reconstruction, and/or weight-based adjustment of the mA/kV was utilized to reduce the radiation dose to as low as  reasonably achievable. COMPARISON: 06/02/2024 CLINICAL HISTORY: Abdominal pain, disorientation. FINDINGS: LOWER CHEST: Cardiomegaly with right coronary artery atheromatous vascular calcification. LIVER: The liver is unremarkable. GALLBLADDER AND BILE DUCTS: Mildly contracted gallbladder. No biliary ductal dilatation. SPLEEN: No acute abnormality. PANCREAS: No acute abnormality. ADRENAL GLANDS: No acute abnormality. KIDNEYS, URETERS AND BLADDER: Innumerous cystic lesions expanding both kidneys favoring autosomal dominant polycystic kidney disease, with some complexity; enhancement characteristics not assessed on today's noncontrast examination. No substantial change from previous. No stones in the kidneys or ureters. No hydronephrosis. No perinephric or periureteral stranding. Empty urinary bladder. GI AND BOWEL: Stomach demonstrates no acute abnormality. Prominent stool ball in the rectum, suspicious for fecal impaction. Perirectal stranding. Stercoral colitis is also not excluded although no pneumatosis is observed. There is no bowel obstruction. PERITONEUM AND RETROPERITONEUM: No ascites. No free air. VASCULATURE: Aorta is normal in caliber. Systemic atherosclerosis is present, including the aorta and iliac arteries. Atheromatous calcified plaque proximally in the superior mesenteric artery. LYMPH NODES: No lymphadenopathy. REPRODUCTIVE ORGANS: No acute abnormality. BONES AND SOFT TISSUES: No acute osseous abnormality. No focal soft tissue abnormality. IMPRESSION: 1. Prominent stool ball in the rectum with perirectal stranding, suspicious for fecal impaction. Cannot completely exclude stercoral colitis. 2. 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 3. Cardiomegaly with right coronary artery atherosclerotic calcification. 4. Systemic atherosclerosis, including the aorta and iliac arteries, with calcified plaque in the proximal superior mesenteric artery. Electronically  signed by: Ryan Salvage MD 08/20/2024 11:17 AM EST RP Workstation: [id number]   CT Head Wo Contrast Result Date:  08/20/2024 EXAM: CT HEAD WITHOUT CONTRAST 08/20/2024 11:03:01 AM TECHNIQUE: CT of the head was performed without the administration of intravenous contrast. Automated exposure control, iterative reconstruction, and/or weight based adjustment of the mA/kV was utilized to reduce the radiation dose to as low as reasonably achievable. COMPARISON: Head CT 06/02/2024. CLINICAL HISTORY: Mental status change, unknown cause. FINDINGS: BRAIN AND VENTRICLES: There is no evidence of an acute infarct, intracranial hemorrhage, mass, midline shift, hydrocephalus, or extra-axial fluid collection. Hypodensities in the cerebral white matter bilaterally are unchanged and nonspecific but compatible with severe chronic small vessel ischemic disease. A chronic infarct is again noted in the right thalamus. There is mild to moderate cerebral atrophy. Calcified atherosclerosis at the skull base. ORBITS: No acute abnormality. SINUSES: No acute abnormality. SOFT TISSUES AND SKULL: No acute soft tissue abnormality. No skull fracture. IMPRESSION: 1. No acute intracranial abnormality. 2. Severe chronic small vessel ischemic disease. Electronically signed by: Dasie Hamburg MD 08/20/2024 11:15 AM EST RP Workstation: [id number]   DG Chest Port 1 View Result Date: 08/20/2024 EXAM: 1 VIEW(S) XRAY OF THE CHEST 08/20/2024 09:25:00 AM COMPARISON: 04/01/2024 CLINICAL HISTORY: AMS AMS AMS AMS AMS FINDINGS: LINES, TUBES AND DEVICES: Right IJ CVC in place with tip terminating over the right atrium. Right axillary vascular stent noted. LUNGS AND PLEURA: Low lung volumes. No focal pulmonary opacity. No pleural effusion. No pneumothorax. HEART AND MEDIASTINUM: Stable cardiomegaly. Tortuous thoracic aorta. Atherosclerotic calcifications. BONES AND SOFT TISSUES: No acute osseous abnormality. IMPRESSION: 1. No acute cardiopulmonary findings.  2. Right IJ central venous catheter tip projects over the right atrium. 3. Stable cardiomegaly. Electronically signed by: Evalene Coho MD 08/20/2024 09:34 AM EST RP Workstation: [id number]    Microbiology: Results for orders placed or performed during the hospital encounter of 08/20/24  Blood Culture (routine x 2)     Status: None (Preliminary result)   Collection Time: 08/20/24  9:25 AM   Specimen: BLOOD  Result Value Ref Range Status   Specimen Description BLOOD BLOOD LEFT HAND  Final   Special Requests NONE  Final   Culture   Final    NO GROWTH < 24 HOURS Performed at [facility], [address], [place], [place] [address]    Report Status PENDING  Incomplete  Resp panel by RT-PCR (RSV, Flu A&B, Covid) Anterior Nasal Swab     Status: None   Collection Time: 08/20/24  9:26 AM   Specimen: Anterior Nasal Swab  Result Value Ref Range Status   SARS Coronavirus 2 by RT PCR NEGATIVE NEGATIVE Final    Comment: (NOTE) SARS-CoV-2 target nucleic acids are NOT DETECTED.  The SARS-CoV-2 RNA is generally detectable in upper respiratory specimens during the acute phase of infection. The lowest concentration of SARS-CoV-2 viral copies this assay can detect is 138 copies/mL. A negative result does not preclude SARS-Cov-2 infection and should not be used as the sole basis for treatment or other patient management decisions. A negative result may occur with  improper specimen collection/handling, submission of specimen other than nasopharyngeal swab, presence of viral mutation(s) within the areas targeted by this assay, and inadequate number of viral copies(<138 copies/mL). A negative result must be combined with clinical observations, patient history, and epidemiological information. The expected result is Negative.  Fact Sheet for Patients:  bloggercourse.com  Fact Sheet for Healthcare Providers:  seriousbroker.it  This test is no t  yet approved or cleared by the [place]  FDA and  has been authorized for detection and/or diagnosis of SARS-CoV-2 by FDA  under an Emergency Use Authorization (EUA). This EUA will remain  in effect (meaning this test can be used) for the duration of the COVID-19 declaration under Section 564(b)(1) of the Act, 21 U.S.C.section 360bbb-3(b)(1), unless the authorization is terminated  or revoked sooner.       Influenza A by PCR NEGATIVE NEGATIVE Final   Influenza B by PCR NEGATIVE NEGATIVE Final    Comment: (NOTE) The Xpert Xpress SARS-CoV-2/FLU/RSV plus assay is intended as an aid in the diagnosis of influenza from Nasopharyngeal swab specimens and should not be used as a sole basis for treatment. Nasal washings and aspirates are unacceptable for Xpert Xpress SARS-CoV-2/FLU/RSV testing.  Fact Sheet for Patients: bloggercourse.com  Fact Sheet for Healthcare Providers: seriousbroker.it  This test is not yet approved or cleared by the [place]  FDA and has been authorized for detection and/or diagnosis of SARS-CoV-2 by FDA under an Emergency Use Authorization (EUA). This EUA will remain in effect (meaning this test can be used) for the duration of the COVID-19 declaration under Section 564(b)(1) of the Act, 21 U.S.C. section 360bbb-3(b)(1), unless the authorization is terminated or revoked.     Resp Syncytial Virus by PCR NEGATIVE NEGATIVE Final    Comment: (NOTE) Fact Sheet for Patients: bloggercourse.com  Fact Sheet for Healthcare Providers: seriousbroker.it  This test is not yet approved or cleared by the [place]  FDA and has been authorized for detection and/or diagnosis of SARS-CoV-2 by FDA under an Emergency Use Authorization (EUA). This EUA will remain in effect (meaning this test can be used) for the duration of the COVID-19 declaration under Section 564(b)(1)  of the Act, 21 U.S.C. section 360bbb-3(b)(1), unless the authorization is terminated or revoked.  Performed at [facility], [address], [place], [place] [address]   Blood Culture (routine x 2)     Status: None (Preliminary result)   Collection Time: 08/20/24  9:33 AM   Specimen: BLOOD  Result Value Ref Range Status   Specimen Description BLOOD BLOOD LEFT ARM  Final   Special Requests NONE  Final   Culture   Final    NO GROWTH < 24 HOURS Performed at [facility], [address], [place], [place] [address]    Report Status PENDING  Incomplete    Labs: CBC: Recent Labs  Lab 08/20/24 0923 08/20/24 0925 08/21/24 0442  WBC  --  6.8 8.7  NEUTROABS  --  4.9  --   HGB 11.2* 10.4* 11.2*  HCT 33.0* 32.0* 34.1*  MCV  --  92.0 89.7  PLT  --  227 249   Basic Metabolic Panel: Recent Labs  Lab 08/20/24 0923 08/20/24 0925 08/20/24 1846 08/21/24 0442  NA 139 140  --  137  K 4.9 4.6  --  3.9  CL 103 100  --  95*  CO2  --  21*  --  26  GLUCOSE 100* 96  --  78  BUN 61* 56*  --  37*  CREATININE 12.60* 11.40*  --  8.43*  CALCIUM   --  9.8  --  9.9  MG  --   --  1.9  --   PHOS  --   --  3.0  --    Liver Function Tests: Recent Labs  Lab 08/20/24 0925  AST 16  ALT <5  ALKPHOS 55  BILITOT 0.4  PROT 7.5  ALBUMIN 4.1   CBG: Recent Labs  Lab 08/20/24 0919  GLUCAP 104*    Discharge time spent:  35  minutes.  Signed: Eric Nunnery, MD [facility] 08/21/2024 "

## 2024-08-21 NOTE — Assessment & Plan Note (Signed)
-   Patient with residual left-sided weakness and also some cognitive impairment - Continue risk factor modifications.

## 2024-08-21 NOTE — Plan of Care (Signed)
" °  Problem: Education: Goal: Knowledge of General Education information will improve Description: Including pain rating scale, medication(s)/side effects and non-pharmacologic comfort measures 08/21/2024 0425 by Myrtie Knee, RN Outcome: Progressing 08/21/2024 0425 by Myrtie Knee, RN Outcome: Progressing   Problem: Health Behavior/Discharge Planning: Goal: Ability to manage health-related needs will improve 08/21/2024 0425 by Myrtie Knee, RN Outcome: Progressing 08/21/2024 0425 by Myrtie Knee, RN Outcome: Progressing   Problem: Clinical Measurements: Goal: Ability to maintain clinical measurements within normal limits will improve 08/21/2024 0425 by Myrtie Knee, RN Outcome: Progressing 08/21/2024 0425 by Myrtie Knee, RN Outcome: Progressing Goal: Will remain free from infection 08/21/2024 0425 by Myrtie Knee, RN Outcome: Progressing 08/21/2024 0425 by Myrtie Knee, RN Outcome: Progressing Goal: Diagnostic test results will improve 08/21/2024 0425 by Myrtie Knee, RN Outcome: Progressing 08/21/2024 0425 by Myrtie Knee, RN Outcome: Progressing Goal: Respiratory complications will improve 08/21/2024 0425 by Myrtie Knee, RN Outcome: Progressing 08/21/2024 0425 by Myrtie Knee, RN Outcome: Progressing Goal: Cardiovascular complication will be avoided 08/21/2024 0425 by Myrtie Knee, RN Outcome: Progressing 08/21/2024 0425 by Myrtie Knee, RN Outcome: Progressing   Problem: Activity: Goal: Risk for activity intolerance will decrease 08/21/2024 0425 by Myrtie Knee, RN Outcome: Progressing 08/21/2024 0425 by Myrtie Knee, RN Outcome: Progressing   Problem: Nutrition: Goal: Adequate nutrition will be maintained 08/21/2024 0425 by Myrtie Knee, RN Outcome: Progressing 08/21/2024 0425 by Myrtie Knee, RN Outcome: Progressing   Problem: Coping: Goal: Level of anxiety will decrease 08/21/2024 0425 by Myrtie Knee, RN Outcome: Progressing 08/21/2024 0425 by Myrtie Knee, RN Outcome: Progressing   Problem: Elimination: Goal: Will not experience complications related to bowel motility 08/21/2024 0425 by Myrtie Knee, RN Outcome: Progressing 08/21/2024 0425 by Myrtie Knee, RN Outcome: Progressing Goal: Will not experience complications related to urinary retention 08/21/2024 0425 by Myrtie Knee, RN Outcome: Progressing 08/21/2024 0425 by Myrtie Knee, RN Outcome: Progressing   Problem: Pain Managment: Goal: General experience of comfort will improve and/or be controlled 08/21/2024 0425 by Myrtie Knee, RN Outcome: Progressing 08/21/2024 0425 by Myrtie Knee, RN Outcome: Progressing   Problem: Safety: Goal: Ability to remain free from injury will improve 08/21/2024 0425 by Myrtie Knee, RN Outcome: Progressing 08/21/2024 0425 by Myrtie Knee, RN Outcome: Progressing   Problem: Skin Integrity: Goal: Risk for impaired skin integrity will decrease 08/21/2024 0425 by Myrtie Knee, RN Outcome: Progressing 08/21/2024 0425 by Myrtie Knee, RN Outcome: Progressing   "

## 2024-08-21 NOTE — Assessment & Plan Note (Signed)
-  Continue PPI

## 2024-08-21 NOTE — Assessment & Plan Note (Signed)
-   Continue the use of Prozac  - No suicidal ideation or hallucination. - Patient mentation is stable and at baseline - B12 and TSH within normal limits.

## 2024-08-21 NOTE — TOC CM/SW Note (Signed)
 Transition of Care [facility]) - Inpatient Brief Assessment   Patient Details  Name: Bryce Miller MRN: [id number] Date of Birth: [birth year]  Transition of Care [facility]) CM/SW Contact:    Lucie Lunger, LCSWA Phone Number: 08/21/2024, 8:08 AM   Clinical Narrative: Transition of Care Department [facility]) has reviewed patient and no TOC needs have been identified at this time. We will continue to monitor patient advancement through interdiciplinary progression rounds. If new patient transition needs arise, please place a TOC consult.  Transition of Care Asessment: Insurance and Status: Insurance coverage has been reviewed Patient has primary care physician: Yes Home environment has been reviewed: From home with family Prior level of function:: Has assistance Prior/Current Home Services: No current home services Social Drivers of Health Review: SDOH reviewed no interventions necessary Readmission risk has been reviewed: Yes Transition of care needs: no transition of care needs at this time

## 2024-08-21 NOTE — Assessment & Plan Note (Signed)
-   Continue hemodialysis service as per outpatient schedule (TTS) - Last treatment 08/20/2024; next treatment anticipated 08/22/24 at his dialysis center. - Patient to continue the use of calcitriol  and Sensipar  as per nephrology discretion - Continue the use of Renvela .

## 2024-08-21 NOTE — Assessment & Plan Note (Signed)
-   No acute seizure appreciated - Continue the use of Keppra  - Continue patient follow-up with neurology service. -Overall condition appears and has remained stable throughout hospitalization.

## 2024-08-21 NOTE — Assessment & Plan Note (Signed)
-   Continue to follow heart healthy/low-sodium diet - Continue dialysis for volume management and assisting with pressure control - Resume home antihypertensive agents; patient's amlodipine  has been adjusted to 10 mg daily instead of 5 as previously prescribed. - Continue to follow vital signs.

## 2024-08-21 NOTE — Assessment & Plan Note (Signed)
-   No overt bleeding appreciated - Continue to follow hemoglobin trend - IV iron and Epogen  therapy as per nephrology discretion.

## 2024-08-21 NOTE — Progress Notes (Signed)
 [facility] Progress Note  Name: Bryce Miller MRN: [id number] DOB: [birth year]  Chief Complaint:  I don't know Sent from HD unit - was altered and felt poorly per their report   Subjective:  Last HD on 1/15 here with 3 kg UF.  Spoke with outpatient HD unit.  Yesterday, he looked subjectively altered and was having jerking motions which they hadn't noted before so he was sent to ensure no occult bacteremia.  Catheter looked alright but was felt to be a potential source of infection given that it was in place.  Spoke his RN - she has been at bedside part of the morning because he has taken leads off and took his dressing off of the HD catheter again.  Updated primary team   Review of systems:  Limited due to his baseline AMS following a stroke a few years ago Denies n/v Denies shortness of breath or chest pain   ------------------ Background on consult:  Bryce Miller is a 64 y.o. male with a history of end-stage renal disease who presented to the hospital after his outpatient dialysis facility directed him to the ER.  Per charting, the patient was altered and they were worried about his tunneled catheter and wanting to ensure this wasn't infected.  ER attending assessed the catheter and saw no overt signs of infection.  Team then reached out to his sister who states that he has limited speech and has had some altered mental status because of the prior stroke.  In the ER he was noted to be markedly hypertensive.  His sister felt that on her assessment in the ER that he was at his baseline per ER report.  The patient states that he does not know why he came.  His sister has expressed that when he tells that dialysis unit that he feels bad, that he is sent to the hospital.  He states that he lives with his sister.  He has been on 2 liters oxygen here and is not on oxygen at home per his own report.  Nephrology is consulted for assistance with management of end-stage renal disease and  hemodialysis.  He has been ordered for dialysis today and blood cultures were ordered.  He is usually at [facility].       Intake/Output Summary (Last 24 hours) at 08/21/2024 1032 Last data filed at 08/20/2024 2023 Gross per 24 hour  Intake 200 ml  Output 3000 ml  Net -2800 ml    Vitals:  Vitals:   08/20/24 2102 08/21/24 0030 08/21/24 0430 08/21/24 1000  BP: (!) 170/117 (!) 167/99 (!) 167/87 (!) 160/96  Pulse: 92 100  (!) 101  Resp: 15 16  18   Temp: 98.6 F (37 C) 98.2 F (36.8 C) 98.2 F (36.8 C) 100.1 F (37.8 C)  TempSrc: Oral Oral Oral Oral  SpO2: 97% 95% 96% 99%  Weight:      Height:         Physical Exam:  General adult male in bed in no acute distress HEENT normocephalic atraumatic extraocular movements intact sclera anicteric Neck supple trachea midline Lungs clear to auscultation bilaterally normal work of breathing at rest on room air Heart S1S2 no rub Abdomen soft nontender nondistended Extremities no edema appreciated Psych no anxiety or agitation Neuro - contracture of left hand and some jerking noted with this upper extremity when he raises his hands; no jerking RUE; he is awake; oriented to person. Year is 2020.  He does not guess  a location  Access RIJ tunn catheter - dressing is off and RN is going to replace  Medications reviewed   Labs:     Latest Ref Rng & Units 08/21/2024    4:42 AM 08/20/2024    9:25 AM 08/20/2024    9:23 AM  BMP  Glucose 70 - 99 mg/dL 78  96  899   BUN 8 - 23 mg/dL 37  56  61   Creatinine 0.61 - 1.24 mg/dL 1.56  88.59  87.39   Sodium 135 - 145 mmol/L 137  140  139   Potassium 3.5 - 5.1 mmol/L 3.9  4.6  4.9   Chloride 98 - 111 mmol/L 95  100  103   CO2 22 - 32 mmol/L 26  21    Calcium  8.9 - 10.3 mg/dL 9.9  9.8      Outpatient HD unit:  [facility] RIJ tunn catheter 4 hrs EDW 86.0 kg Last post weight 85.7 kg on 1/13 BF  400 ml/min DF 500 ml/min 2K/2.5 Ca Standard dialyzer Meds:  Calcitriol   2.75 mcg every tx  Sensipar  30 mg three times a week  Mircera 50 mcg every 2 weeks (last dose 1/13)   Assessment/Plan:    # ESRD - HD per TTS schedule - Obtained outpatient HD orders as above - Note he has a tunneled dialysis catheter in place.  No erythema or exudate.  Blood cultures were ordered - NGTD    # HTN  - HD per TTS schedule as above - Change amlodipine  to nightly dosing.  Increase dose to 10 mg.  Will give another 5 mg once tonight and then doses for tomorrow onward would be 10 mg nightly.  Would continue same outpatient - Continue other current home regimen    # Altered mental status - Patient has some AMS at baseline since stroke per his family - Family assessed in the ER and felt he was at his baseline  - Blood cultures were ordered and are NGTD   # Anemia of CKD  - Hb acceptable  - recent ESA outpatient    # Metabolic bone disease  - continue sensipar   - continue calcitriol  - Please do not list either of these agents on med list as he gets them at HD and we want to avoid duplicate dosing at home  Disposition - per primary team      Katheryn JAYSON Saba, MD 08/21/2024 11:03 AM

## 2024-08-22 LAB — HEPATITIS B SURFACE ANTIBODY, QUANTITATIVE: Hep B S AB Quant (Post): 72.4 m[IU]/mL

## 2024-08-24 NOTE — Progress Notes (Signed)
 D/c orders noted. Contacted pt out-pt hd clinic, [facility] , to inform of pt d/c and expected arrival back at clinic 08/25/2024. D/c summary has been faxed over. No further assistance needed at this time.   Suzen Satchel Dialysis Navigator [phone]

## 2024-08-25 LAB — CULTURE, BLOOD (ROUTINE X 2)
Culture: NO GROWTH
Culture: NO GROWTH
